# Patient Record
Sex: Male | Born: 1935 | Race: White | Hispanic: No | Marital: Married | Smoking: Never smoker
Health system: Southern US, Community
[De-identification: ages and names within clinical notes are randomized; demographics above are authoritative.]

## PROBLEM LIST (undated history)

## (undated) DIAGNOSIS — I509 Heart failure, unspecified: Secondary | ICD-10-CM

## (undated) DIAGNOSIS — N183 Chronic kidney disease, stage 3 unspecified: Secondary | ICD-10-CM

## (undated) DIAGNOSIS — Z950 Presence of cardiac pacemaker: Secondary | ICD-10-CM

## (undated) DIAGNOSIS — H353230 Exudative age-related macular degeneration, bilateral, stage unspecified: Secondary | ICD-10-CM

## (undated) DIAGNOSIS — I10 Essential (primary) hypertension: Secondary | ICD-10-CM

## (undated) DIAGNOSIS — I5031 Acute diastolic (congestive) heart failure: Secondary | ICD-10-CM

## (undated) DIAGNOSIS — K219 Gastro-esophageal reflux disease without esophagitis: Secondary | ICD-10-CM

## (undated) DIAGNOSIS — E1140 Type 2 diabetes mellitus with diabetic neuropathy, unspecified: Secondary | ICD-10-CM

## (undated) DIAGNOSIS — L03115 Cellulitis of right lower limb: Secondary | ICD-10-CM

## (undated) DIAGNOSIS — E119 Type 2 diabetes mellitus without complications: Secondary | ICD-10-CM

## (undated) DIAGNOSIS — M1990 Unspecified osteoarthritis, unspecified site: Secondary | ICD-10-CM

## (undated) DIAGNOSIS — K529 Noninfective gastroenteritis and colitis, unspecified: Secondary | ICD-10-CM

## (undated) DIAGNOSIS — E11319 Type 2 diabetes mellitus with unspecified diabetic retinopathy without macular edema: Secondary | ICD-10-CM

## (undated) DIAGNOSIS — J90 Pleural effusion, not elsewhere classified: Secondary | ICD-10-CM

## (undated) DIAGNOSIS — I441 Atrioventricular block, second degree: Secondary | ICD-10-CM

## (undated) HISTORY — DX: Noninfective gastroenteritis and colitis, unspecified: K52.9

## (undated) HISTORY — PX: BACK SURGERY: SHX140

## (undated) HISTORY — PX: APPENDECTOMY: SHX54

## (undated) HISTORY — PX: CATARACT EXTRACTION W/ INTRAOCULAR LENS  IMPLANT, BILATERAL: SHX1307

## (undated) HISTORY — DX: Heart failure, unspecified: I50.9

## (undated) HISTORY — DX: Pleural effusion, not elsewhere classified: J90

## (undated) HISTORY — PX: LUMBAR DISC SURGERY: SHX700

## (undated) HISTORY — DX: Cellulitis of right lower limb: L03.115

## (undated) HISTORY — PX: BRAIN SURGERY: SHX531

---

## 2001-02-16 ENCOUNTER — Encounter: Payer: Self-pay | Admitting: Ophthalmology

## 2001-02-17 ENCOUNTER — Ambulatory Visit (HOSPITAL_COMMUNITY): Admission: RE | Admit: 2001-02-17 | Discharge: 2001-02-18 | Payer: Self-pay | Admitting: Ophthalmology

## 2001-12-14 ENCOUNTER — Encounter: Payer: Self-pay | Admitting: Internal Medicine

## 2001-12-14 ENCOUNTER — Encounter: Admission: RE | Admit: 2001-12-14 | Discharge: 2001-12-14 | Payer: Self-pay | Admitting: Internal Medicine

## 2004-04-05 ENCOUNTER — Encounter: Admission: RE | Admit: 2004-04-05 | Discharge: 2004-04-05 | Payer: Self-pay | Admitting: Internal Medicine

## 2005-10-18 ENCOUNTER — Emergency Department (HOSPITAL_COMMUNITY): Admission: EM | Admit: 2005-10-18 | Discharge: 2005-10-18 | Payer: Self-pay | Admitting: Emergency Medicine

## 2006-08-14 ENCOUNTER — Encounter: Admission: RE | Admit: 2006-08-14 | Discharge: 2006-08-14 | Payer: Self-pay | Admitting: Family Medicine

## 2008-09-01 ENCOUNTER — Encounter: Admission: RE | Admit: 2008-09-01 | Discharge: 2008-09-01 | Payer: Self-pay | Admitting: Internal Medicine

## 2009-07-08 ENCOUNTER — Emergency Department (HOSPITAL_COMMUNITY): Admission: EM | Admit: 2009-07-08 | Discharge: 2009-07-08 | Payer: Self-pay | Admitting: Emergency Medicine

## 2010-12-14 NOTE — H&P (Signed)
Ronald Lindsey. [facility]  Patient:    Ronald Lindsey, Ronald Lindsey                       MRN: [id number] Adm. Date:  02/17/01 Attending:  Guadelupe Sabin, M.D.                         History and Physical  REASON FOR ADMISSION:  This was a planned outpatient readmission of this 77 year old white male admitted for cataract implant surgery of the left eye.  PRESENT ILLNESS:  This patient has a long history of insulin-dependent diabetes mellitus and secondary complications consisting of diabetic retinopathy.  Patient was referred to my office in January 1998 by Dr. Caleen Jobs for evaluation of diabetic retinopathy with macular edema.  Initially examination revealed a visual acuity of 20/100 right eye, 20/50 left eye without correction, and 20/40- right eye, 20/30- left eye with correction. Detailed fundus examination revealed background diabetic retinopathy with paramacular edema.  Subsequent laser photocoagulation was performed.  Later, cataract formation began to develop later in December 1998 and the patient was previously admitted to the hospital on August 02, 1997 for cataract implant surgery of the right eye.  Patient tolerated the procedure well and vision gradually improved to 20/40 in the operated eye.  Meanwhile, the unoperated left eye has deteriorated despite additional laser photocoagulation for macular edema.  It was felt that the patient warranted similar cataract implant surgery of the left eye at this time.  Patient was given oral discussion and printed information concerning the procedure and its complications.  He signed an informed consent and arrangements were made for his outpatient admission at this time.  PAST MEDICAL HISTORY:  Patient checks his blood sugar erratically and continuous to take Ocuvite multivitamin tablets for his macular edema. Patient is currently under the care of Dr. Merril Abbe, taking several medications under his direction.   These include Glucotrol, Glucophage, and Avandia for his blood sugar control.  REVIEW OF SYSTEMS:  No cardiorespiratory complaints.  PHYSICAL EXAMINATION:  VITAL SIGNS:  As recorded on admission, blood pressure 132/63, respirations 18, heart rate 67, temperature 96.9.  GENERAL APPEARANCE:  Patient is a pleasant, well-nourished, well-developed white male in no acute ocular distress.  HEENT:  Visual acuity last recorded at 20/40+2 right eye, 20/70 left eye. Applanation tonometry:  18 mm right eye, 17 mm left eye.  External ocular and slit lamp examination:  The eyes are white and clear.  The right eye reveals a posterior chamber intraocular lens implant with a clear posterior capsule. Left eye reveals nuclear cataract formation.  Detailed fundus examination shows at this time a clear vitreous, attached retina.  The optic nerve is sharply outlined, of good color, disk/cup ratio 0.4.  Old background diabetic retinopathy with laser photocoagulation scars is present in both eyes.  There does not appear to be any clinically significant macular edema at the present time.  CHEST:  Lungs clear to percussion and auscultation.  HEART:  Normal sinus rhythm, no cardiomegaly, no murmurs.  ABDOMEN:  Negative.  EXTREMITIES:  Negative.  ADMISSION DIAGNOSES: 1. Senile cataract left eye, pseudophakia right eye. 2. Non-insulin-dependent diabetes. 3. Diabetic background retinopathy both eyes, status post laser    photocoagulation both eyes.  SURGICAL PLAN:  Cataract implant surgery - left eye. DD:  02/17/01 TD:  02/17/01 Job: [id number]

## 2010-12-14 NOTE — Op Note (Signed)
[place]. [facility]  Patient:    Ronald Lindsey, Ronald Lindsey                       MRN: [id number] Proc. Date: 02/17/01 Attending:  Guadelupe Sabin, M.D.                           Operative Report  PREOPERATIVE DIAGNOSES: 1. Senile cataract, left eye. 2. Background diabetic retinopathy, left eye. 3. Status post focal laser photocoagulation, left eye.  SURGEON:  Guadelupe Sabin, M.D.  ASSISTANT:  Nurse.  ANESTHESIA:  Local - 4% Xylocaine, 0.75% Marcaine.  Anesthesia standby required.  Patient given sodium pentothal intravenously during the period of retrobulbar injection.  OPERATIVE PROCEDURE:  After the patient was prepped and draped a lid speculum was inserted in the left eye.  Schiotz tonometry was recorded at 6-7 scale units with a 5.5 g weight.  A peritomy was performed adjacent to the limbus from the 11 to 1 oclock position.  The corneoscleral junction was cleaned and a corneoscleral groove made with a 45 degree Superblade.  The anterior chamber was then entered with the Diamond 2.5 mm keratome at the 12 oclock position and the 15 degree blade at the 2:30 position.  Using a bent 26 gauge needle on a Healon syringe, a circular capsulorrhexis was begun and then completed with the Grabow forceps.  Hydrodissection and hydrodelineation were performed using 1% Xylocaine.  The 30 degree phacoemulsification tip was then inserted with slow, controlled emulsification of the lens nucleus.  Total ultrasonic time - one minute six seconds; average power level - 17%; total amount of fluid used - 75 cc.  Following removal of the nucleus the cortex was aspirated with the irrigation aspiration tip.  The posterior capsule appeared intact with brilliant red fundus reflex.  It was therefore elected to insert an [employer] SI40NB silicon posterior chamber intraocular lens with UV absorber, diopter strength +21.50.  This was inserted with the McDonald forceps into  the anterior chamber and then centered into the capsular bag using the [facility] lens rotator.  The lens appeared to be well centered.  The Healon which had been used during the procedure was aspirated and replaced with balanced salt solution and Miochol ophthalmic solution.  The operative incisions appeared to be self-sealing and no sutures were required.  Maxitrol ointment was instilled in the conjunctival cul-de-sac.  A light patch and protector shield were applied.  Duration of procedure and anesthesia administration - 45 minutes.  Patient tolerated the procedure well in general, left the operating room for the recovery room in good condition. DD:  02/17/01 TD:  02/17/01 Job: [id number]

## 2011-11-06 ENCOUNTER — Inpatient Hospital Stay (HOSPITAL_COMMUNITY)
Admission: EM | Admit: 2011-11-06 | Discharge: 2011-11-13 | DRG: 392 | Disposition: A | Discharge status: Home Health | Payer: Medicare Other | Attending: Internal Medicine | Admitting: Internal Medicine

## 2011-11-06 ENCOUNTER — Encounter (HOSPITAL_COMMUNITY): Payer: Self-pay | Admitting: Emergency Medicine

## 2011-11-06 DIAGNOSIS — I10 Essential (primary) hypertension: Secondary | ICD-10-CM | POA: Insufficient documentation

## 2011-11-06 DIAGNOSIS — Z9049 Acquired absence of other specified parts of digestive tract: Secondary | ICD-10-CM

## 2011-11-06 DIAGNOSIS — E1149 Type 2 diabetes mellitus with other diabetic neurological complication: Secondary | ICD-10-CM | POA: Diagnosis present

## 2011-11-06 DIAGNOSIS — E1142 Type 2 diabetes mellitus with diabetic polyneuropathy: Secondary | ICD-10-CM | POA: Diagnosis present

## 2011-11-06 DIAGNOSIS — K529 Noninfective gastroenteritis and colitis, unspecified: Secondary | ICD-10-CM

## 2011-11-06 DIAGNOSIS — K5289 Other specified noninfective gastroenteritis and colitis: Principal | ICD-10-CM | POA: Diagnosis present

## 2011-11-06 DIAGNOSIS — E860 Dehydration: Secondary | ICD-10-CM

## 2011-11-06 DIAGNOSIS — E119 Type 2 diabetes mellitus without complications: Secondary | ICD-10-CM | POA: Diagnosis present

## 2011-11-06 DIAGNOSIS — R142 Eructation: Secondary | ICD-10-CM | POA: Diagnosis not present

## 2011-11-06 DIAGNOSIS — L02419 Cutaneous abscess of limb, unspecified: Secondary | ICD-10-CM | POA: Diagnosis present

## 2011-11-06 DIAGNOSIS — M549 Dorsalgia, unspecified: Secondary | ICD-10-CM | POA: Insufficient documentation

## 2011-11-06 DIAGNOSIS — D72829 Elevated white blood cell count, unspecified: Secondary | ICD-10-CM | POA: Diagnosis present

## 2011-11-06 DIAGNOSIS — K560 Paralytic ileus: Secondary | ICD-10-CM | POA: Diagnosis not present

## 2011-11-06 DIAGNOSIS — N179 Acute kidney failure, unspecified: Secondary | ICD-10-CM | POA: Diagnosis present

## 2011-11-06 DIAGNOSIS — H3530 Unspecified macular degeneration: Secondary | ICD-10-CM | POA: Insufficient documentation

## 2011-11-06 DIAGNOSIS — Z9889 Other specified postprocedural states: Secondary | ICD-10-CM

## 2011-11-06 DIAGNOSIS — M7989 Other specified soft tissue disorders: Secondary | ICD-10-CM

## 2011-11-06 DIAGNOSIS — M545 Low back pain, unspecified: Secondary | ICD-10-CM | POA: Diagnosis present

## 2011-11-06 DIAGNOSIS — R141 Gas pain: Secondary | ICD-10-CM | POA: Diagnosis not present

## 2011-11-06 DIAGNOSIS — N19 Unspecified kidney failure: Secondary | ICD-10-CM | POA: Diagnosis present

## 2011-11-06 DIAGNOSIS — G8929 Other chronic pain: Secondary | ICD-10-CM | POA: Diagnosis present

## 2011-11-06 DIAGNOSIS — Z794 Long term (current) use of insulin: Secondary | ICD-10-CM

## 2011-11-06 DIAGNOSIS — E11319 Type 2 diabetes mellitus with unspecified diabetic retinopathy without macular edema: Secondary | ICD-10-CM | POA: Insufficient documentation

## 2011-11-06 DIAGNOSIS — E1139 Type 2 diabetes mellitus with other diabetic ophthalmic complication: Secondary | ICD-10-CM | POA: Diagnosis present

## 2011-11-06 DIAGNOSIS — L03115 Cellulitis of right lower limb: Secondary | ICD-10-CM | POA: Diagnosis present

## 2011-11-06 DIAGNOSIS — K6389 Other specified diseases of intestine: Secondary | ICD-10-CM | POA: Diagnosis present

## 2011-11-06 DIAGNOSIS — L03119 Cellulitis of unspecified part of limb: Secondary | ICD-10-CM | POA: Diagnosis present

## 2011-11-06 DIAGNOSIS — E1140 Type 2 diabetes mellitus with diabetic neuropathy, unspecified: Secondary | ICD-10-CM | POA: Insufficient documentation

## 2011-11-06 DIAGNOSIS — R5381 Other malaise: Secondary | ICD-10-CM | POA: Diagnosis not present

## 2011-11-06 DIAGNOSIS — K219 Gastro-esophageal reflux disease without esophagitis: Secondary | ICD-10-CM | POA: Insufficient documentation

## 2011-11-06 DIAGNOSIS — N183 Chronic kidney disease, stage 3 unspecified: Secondary | ICD-10-CM | POA: Diagnosis present

## 2011-11-06 DIAGNOSIS — I129 Hypertensive chronic kidney disease with stage 1 through stage 4 chronic kidney disease, or unspecified chronic kidney disease: Secondary | ICD-10-CM | POA: Diagnosis present

## 2011-11-06 HISTORY — DX: Gastro-esophageal reflux disease without esophagitis: K21.9

## 2011-11-06 HISTORY — DX: Cellulitis of right lower limb: L03.115

## 2011-11-06 HISTORY — DX: Type 2 diabetes mellitus with diabetic neuropathy, unspecified: E11.40

## 2011-11-06 HISTORY — DX: Essential (primary) hypertension: I10

## 2011-11-06 HISTORY — DX: Noninfective gastroenteritis and colitis, unspecified: K52.9

## 2011-11-06 LAB — COMPREHENSIVE METABOLIC PANEL
ALT: 61 U/L — ABNORMAL HIGH (ref 0–53)
AST: 130 U/L — ABNORMAL HIGH (ref 0–37)
CO2: 18 mEq/L — ABNORMAL LOW (ref 19–32)
Calcium: 8.3 mg/dL — ABNORMAL LOW (ref 8.4–10.5)
GFR calc non Af Amer: 21 mL/min — ABNORMAL LOW (ref >90)
Sodium: 136 mEq/L (ref 135–145)

## 2011-11-06 LAB — CBC
MCV: 97.2 fL (ref 78.0–100.0)
Platelets: 159 10*3/uL (ref 150–400)
RDW: 14.3 % (ref 11.5–15.5)
WBC: 22.6 10*3/uL — ABNORMAL HIGH (ref 4.0–10.5)

## 2011-11-06 LAB — DIFFERENTIAL
Basophils Absolute: 0 10*3/uL (ref 0.0–0.1)
Eosinophils Relative: 0 % (ref 0–5)
Lymphocytes Relative: 4 % — ABNORMAL LOW (ref 12–46)
Neutro Abs: 20.7 10*3/uL — ABNORMAL HIGH (ref 1.7–7.7)

## 2011-11-06 LAB — GLUCOSE, CAPILLARY
Glucose-Capillary: 63 mg/dL — ABNORMAL LOW (ref 70–99)
Glucose-Capillary: 90 mg/dL (ref 70–99)
Glucose-Capillary: 85 mg/dL (ref 70–99)

## 2011-11-06 MED ORDER — SIMETHICONE 80 MG PO CHEW
80.0000 mg | CHEWABLE_TABLET | Freq: Four times a day (QID) | ORAL | Status: DC
  Administered 2011-11-06 (×2): 80 mg via ORAL
  Filled 2011-11-06 (×6): qty 1

## 2011-11-06 MED ORDER — FAMOTIDINE IN NACL 20-0.9 MG/50ML-% IV SOLN
20.0000 mg | Freq: Two times a day (BID) | INTRAVENOUS | Status: DC
  Administered 2011-11-06 – 2011-11-07 (×3): 20 mg via INTRAVENOUS
  Filled 2011-11-06 (×4): qty 50

## 2011-11-06 MED ORDER — MORPHINE SULFATE 2 MG/ML IJ SOLN
2.0000 mg | Freq: Once | INTRAMUSCULAR | Status: AC
  Administered 2011-11-06: 2 mg via INTRAVENOUS
  Filled 2011-11-06: qty 1

## 2011-11-06 MED ORDER — PANTOPRAZOLE SODIUM 40 MG IV SOLR
40.0000 mg | INTRAVENOUS | Status: DC
  Administered 2011-11-07: 40 mg via INTRAVENOUS
  Filled 2011-11-06 (×2): qty 40

## 2011-11-06 MED ORDER — SODIUM CHLORIDE 0.9 % IV BOLUS (SEPSIS)
500.0000 mL | Freq: Once | INTRAVENOUS | Status: AC
  Administered 2011-11-06: 500 mL via INTRAVENOUS

## 2011-11-06 MED ORDER — HEPARIN SODIUM (PORCINE) 5000 UNIT/ML IJ SOLN
5000.0000 [IU] | Freq: Three times a day (TID) | INTRAMUSCULAR | Status: DC
  Administered 2011-11-06 – 2011-11-13 (×20): 5000 [IU] via SUBCUTANEOUS
  Filled 2011-11-06 (×27): qty 1

## 2011-11-06 MED ORDER — HYDROMORPHONE HCL PF 1 MG/ML IJ SOLN
1.0000 mg | INTRAMUSCULAR | Status: DC | PRN
  Administered 2011-11-06 – 2011-11-08 (×3): 1 mg via INTRAVENOUS
  Filled 2011-11-06 (×3): qty 1

## 2011-11-06 MED ORDER — SIMETHICONE 40 MG/0.6ML PO SUSP (UNIT DOSE)
40.0000 mg | Freq: Once | ORAL | Status: AC
  Administered 2011-11-06: 40 mg via ORAL
  Filled 2011-11-06 (×2): qty 0.6

## 2011-11-06 MED ORDER — DEXTROSE-NACL 5-0.9 % IV SOLN
INTRAVENOUS | Status: DC
  Administered 2011-11-06: 125 mL/h via INTRAVENOUS
  Administered 2011-11-06: 15:00:00 via INTRAVENOUS

## 2011-11-06 MED ORDER — CEFAZOLIN SODIUM 1-5 GM-% IV SOLN
1.0000 g | Freq: Three times a day (TID) | INTRAVENOUS | Status: DC
  Administered 2011-11-06 – 2011-11-13 (×21): 1 g via INTRAVENOUS
  Filled 2011-11-06 (×24): qty 50

## 2011-11-06 MED ORDER — ACETAMINOPHEN 650 MG RE SUPP: 650.0000 mg | Freq: Four times a day (QID) | RECTAL | Status: DC | PRN

## 2011-11-06 MED ORDER — SODIUM CHLORIDE 0.9 % IV BOLUS (SEPSIS): 500.0000 mL | Freq: Once | INTRAVENOUS | Status: DC

## 2011-11-06 MED ORDER — ONDANSETRON HCL 4 MG/2ML IJ SOLN
INTRAMUSCULAR | Status: AC
  Administered 2011-11-06: 4 mg via INTRAVENOUS
  Filled 2011-11-06: qty 2

## 2011-11-06 MED ORDER — SODIUM CHLORIDE 0.9 % IV SOLN: Freq: Once | INTRAVENOUS | Status: DC

## 2011-11-06 MED ORDER — ACETAMINOPHEN 325 MG PO TABS
650.0000 mg | ORAL_TABLET | Freq: Four times a day (QID) | ORAL | Status: DC | PRN
  Administered 2011-11-12 (×2): 650 mg via ORAL
  Filled 2011-11-06 (×2): qty 2

## 2011-11-06 MED ORDER — ONDANSETRON HCL 4 MG/2ML IJ SOLN
4.0000 mg | Freq: Four times a day (QID) | INTRAMUSCULAR | Status: DC | PRN
  Administered 2011-11-06 (×2): 4 mg via INTRAVENOUS
  Filled 2011-11-06 (×2): qty 2

## 2011-11-06 MED ORDER — ONDANSETRON HCL 4 MG PO TABS: 4.0000 mg | ORAL_TABLET | Freq: Four times a day (QID) | ORAL | Status: DC | PRN

## 2011-11-06 MED ORDER — DEXTROSE 50 % IV SOLN
25.0000 mL | Freq: Once | INTRAVENOUS | Status: AC
  Administered 2011-11-06: 25 mL via INTRAVENOUS
  Filled 2011-11-06: qty 50

## 2011-11-06 NOTE — Progress Notes (Signed)
Pt. Arrived on the floor 1630, states feeling very nauseous. No emesis or diarrhea noted. He remains NPO. Sleeping at present.

## 2011-11-06 NOTE — ED Notes (Signed)
Bed:WA23<BR> Expected date:<BR> Expected time:<BR> Means of arrival:<BR> Comments:<BR> Ems/ n/v

## 2011-11-06 NOTE — ED Notes (Signed)
CBG registered 90 on ED Glucometer 

## 2011-11-06 NOTE — ED Provider Notes (Signed)
History     CSN: [id number]  Arrival date & time 11/06/11  4696   First MD Initiated Contact with Patient 11/06/11 0840      Chief Complaint  Patient presents with  . Nausea  . Emesis  . Diarrhea    (Consider location/radiation/quality/duration/timing/severity/associated sxs/prior treatment) HPI Pt p/w N/V/D starting late on Monday. Pt states wife had similar symptoms earlier. Has had multiple episodes of diarrhea > 20 and continues to vomit occasionally. Fever at home per wife. No blood in stool or vomitus. No abdominal pain currently. Tolerating fluids.  Past Medical History  Diagnosis Date  . Diabetes mellitus     Past Surgical History  Procedure Date  . Brain surgery     History reviewed. No pertinent family history.  History  Substance Use Topics  . Smoking status: Never Smoker   . Smokeless tobacco: Never Used  . Alcohol Use: No      Review of Systems  Constitutional: Positive for fever and fatigue. Negative for chills.  HENT: Negative for neck pain.   Respiratory: Negative for shortness of breath.   Cardiovascular: Negative for chest pain.  Gastrointestinal: Positive for nausea, vomiting and diarrhea. Negative for abdominal pain, blood in stool and abdominal distention.  Genitourinary: Negative for dysuria and flank pain.  Skin: Negative for rash and wound.  Neurological: Negative for dizziness, weakness and numbness.    Allergies  Review of patient's allergies indicates no known allergies.  Home Medications   Current Outpatient Rx  Name Route Sig Dispense Refill  . ASPIRIN 81 MG PO CHEW Oral Chew 81 mg by mouth daily.    . FUROSEMIDE 20 MG PO TABS Oral Take 20 mg by mouth 2 (two) times daily.    . INSULIN GLARGINE 100 UNIT/ML [place] SOLN Subcutaneous Inject 65 Units into the skin daily.    Marland Kitchen LISINOPRIL-HYDROCHLOROTHIAZIDE 10-12.5 MG PO TABS Oral Take 1 tablet by mouth daily.    Marland Kitchen METHOCARBAMOL 750 MG PO TABS Oral Take 750 mg by mouth every 8 (eight)  hours as needed. For pain    . OMEPRAZOLE 20 MG PO CPDR Oral Take 20 mg by mouth daily.    Marland Kitchen PREDNISONE 10 MG PO TABS Oral Take 10 mg by mouth daily. Patient takes 6,5,4,3,2,1      BP 106/64  Pulse 72  Temp(Src) 98.9 F (37.2 C) (Oral)  Resp 18  Ht 5\' 9"  (1.753 m)  Wt 256 lb (116.121 kg)  BMI 37.80 kg/m2  SpO2 99%  Physical Exam  Nursing note and vitals reviewed. Constitutional: He is oriented to person, place, and time. He appears well-developed and well-nourished. No distress.  HENT:  Head: Normocephalic and atraumatic.  Mouth/Throat: Oropharynx is clear and moist.  Eyes: EOM are normal. Pupils are equal, round, and reactive to light.  Neck: Normal range of motion. Neck supple.  Cardiovascular: Normal rate and regular rhythm.   Pulmonary/Chest: Effort normal and breath sounds normal. No respiratory distress. He has no wheezes. He has no rales.  Abdominal: Soft. Bowel sounds are normal. There is no tenderness. There is no rebound and no guarding.  Musculoskeletal: Normal range of motion. He exhibits no edema and no tenderness.       RLE, mildly erythematous and swollen. No calf tenderness or definite cellulitis  Neurological: He is alert and oriented to person, place, and time.  Skin: Skin is warm and dry. No rash noted. No erythema.  Psychiatric: He has a normal mood and affect. His behavior is normal.  ED Course  Procedures (including critical care time)  Labs Reviewed  GLUCOSE, CAPILLARY - Abnormal; Notable for the following:    Glucose-Capillary 63 (*)    All other components within normal limits  CBC - Abnormal; Notable for the following:    WBC 22.6 (*)    All other components within normal limits  DIFFERENTIAL - Abnormal; Notable for the following:    Neutrophils Relative 92 (*)    Neutro Abs 20.7 (*)    Lymphocytes Relative 4 (*)    All other components within normal limits  COMPREHENSIVE METABOLIC PANEL - Abnormal; Notable for the following:    CO2 18 (*)     Glucose, Bld 63 (*)    BUN 57 (*)    Creatinine, Ser 2.64 (*)    Calcium 8.3 (*)    Albumin 2.9 (*)    AST 130 (*)    ALT 61 (*)    GFR calc non Af Amer 21 (*)    GFR calc Af Amer 24 (*)    All other components within normal limits  LIPASE, BLOOD - Abnormal; Notable for the following:    Lipase 9 (*)    All other components within normal limits  GLUCOSE, CAPILLARY  URINALYSIS, ROUTINE W REFLEX MICROSCOPIC   No results found.   1. Gastroenteritis   2. Dehydration   3. Renal failure       MDM  Discussed with [facility]. Will admit.         Loren Racer, MD 11/06/11 1146

## 2011-11-06 NOTE — H&P (Signed)
PCP:   Katy Apo, MD, MD   Chief Complaint:  Nausea/ vomiting/ diarrhea  HPI: This is an 78 y/o male with DM, HTN, recent back pain treated w/ steroids who comes in with 2 days of vomiting and diarrhea. He has had numerous episodes. No blood noted in vomitus or stool.His stomach is bloated but he can burp. Abdominal pain is diffuse. Per his wife he had a fever of 102 last night. She was sick with the same symptoms but has recovered quickly.  He was hypoglycemic last night and in the ER today. He took 40 U of his Lantus (usual dose) last night.  Wife also noted his right leg is very red today. He is not having any pain in it.    Review of Systems:  Positive for  - back pain not improved w/ course of Prednisone ordered by Dr polite -retinopathy and macular degeneration - numbness sin feet and hands -pedal edema being treated w/ lasix  Negative for - migranes - CVA/ TIA - depression/ anxiety - chest pain/ palpitations - dyspnea/ cough/ sinus trouble/ flu like symptoms  Past Medical History: Past Medical History  Diagnosis Date  . Diabetes mellitus   . Diabetic retinopathy   . Macular degeneration   . Diabetic neuropathy   . HTN (hypertension)   . GERD (gastroesophageal reflux disease)   . Back pain   . Previous back surgery 11/06/2011  . S/P appendectomy 11/06/2011   Past Surgical History  Procedure Date  . Brain surgery     Medications: Prior to Admission medications   Medication Sig Start Date End Date Taking? Authorizing Provider  aspirin 81 MG chewable tablet Chew 81 mg by mouth daily.   Yes Historical Provider, MD  furosemide (LASIX) 20 MG tablet Take 20 mg by mouth 2 (two) times daily.   Yes Historical Provider, MD  insulin glargine (LANTUS) 100 UNIT/ML injection 40 U BID   Yes Historical Provider, MD  lisinopril-hydrochlorothiazide (PRINZIDE,ZESTORETIC) 10-12.5 MG per tablet Take 1 tablet by mouth daily.   Yes Historical Provider, MD  methocarbamol (ROBAXIN)  750 MG tablet Take 750 mg by mouth every 8 (eight) hours as needed. For pain   Yes Historical Provider, MD  omeprazole (PRILOSEC) 20 MG capsule Take 20 mg by mouth daily.   Yes Historical Provider, MD  predniSONE (DELTASONE) 10 MG tablet Finished a taper 10/30/11  Yes Historical Provider, MD    Allergies:  No Known Allergies  Social History:  reports that he has never smoked. He has never used smokeless tobacco. He reports that he does not drink alcohol or use illicit drugs.  Family History: Mother had cancer (type unknown to patient) and angina Brother has CAD w/ stents  Physical Exam: Filed Vitals:   11/06/11 0832 11/06/11 0839  BP:  106/64  Pulse:  72  Temp:  98.9 F (37.2 C)  TempSrc:  Oral  Resp:  18  Height: 5\' 9"  (1.753 m)   Weight: 116.121 kg (256 lb)   SpO2:  99%   General appearance: alert, cooperative and morbidly obese Throat: dry mouth Resp: clear to auscultation bilaterally Cardio: regular rate and rhythm, S1, S2 normal, no murmur, click, rub or gallop GI: distended - tight , tympanic, BS positive, nontender.  Extremities: no c/c/e Skin:  right leg with erythema from ankle to just below the knee, peticheal hemorrhages, mildly warm, nontender, no edema   Labs on Admission:   [facility] 11/06/11 0915  NA 136  K 3.6  CL  104  CO2 18*  GLUCOSE 63*  BUN 57*  CREATININE 2.64*  CALCIUM 8.3*  MG --  PHOS --    Basename 11/06/11 0915  AST 130*  ALT 61*  ALKPHOS 71  BILITOT 0.6  PROT 6.2  ALBUMIN 2.9*    Basename 11/06/11 0915  LIPASE 9*  AMYLASE --    Basename 11/06/11 0915  WBC 22.6*  NEUTROABS 20.7*  HGB 14.7  HCT 44.7  MCV 97.2  PLT 159   No results found for this basename: CKTOTAL:3,CKMB:3,CKMBINDEX:3,TROPONINI:3 in the last 72 hours No results found for this basename: TSH,T4TOTAL,FREET3,T3FREE,THYROIDAB in the last 72 hours No results found for this basename: VITAMINB12:2,FOLATE:2,FERRITIN:2,TIBC:2,IRON:2,RETICCTPCT:2 in the last 72  hours  Radiological Exams on Admission: No results found.  EKG: Orders placed during the hospital encounter of 11/06/11  . EKG 12-LEAD  . EKG 12-LEAD    Assessment/Plan Principal Problem:  *Gastroenteritis Suspect that this is viral and he caught it from his wife- may be norovirus Will treat symptomatically w/ IVF, NPO, Zofran, Pepcid, Simethicone  Active Problems:  Renal failure No baseline to compare with Hydrate Hold lasix and ACE/HCTZ REnal dose meds   Cellulitis of right leg Ancef for now Ultrasound of leg done in ER- was negative for DVT   Back pain Dilaudid PRN   DM (diabetes mellitus)- with hypoglycemia from lantus D5 in fluids, accuchecks Q4   HTN (hypertension) Currently hypotense- hold meds   GERD (gastroesophageal reflux disease) Protonix and pepcid IV   Diabetic retinopathy  Macular degeneration  Diabetic neuropathy   Kellye Mizner 11/06/2011, 12:55 PM

## 2011-11-06 NOTE — Progress Notes (Signed)
VASCULAR LAB PRELIMINARY  PRELIMINARY  PRELIMINARY  PRELIMINARY  Right lower extremity venous duplex completed.    Preliminary report:  Right:  No evidence of DVT, superficial thrombosis, or Baker's cyst.  Terance Hart, RVT 11/06/2011, 9:38 AM

## 2011-11-06 NOTE — ED Notes (Signed)
Pt aware of the need for a urine sample however, is unable to void at this time. Urinal at bedside. 

## 2011-11-06 NOTE — ED Notes (Signed)
CBG registered 63 on ED Glucometer

## 2011-11-06 NOTE — ED Notes (Signed)
CBG registered 85 on ED Glucometer. 

## 2011-11-07 ENCOUNTER — Inpatient Hospital Stay (HOSPITAL_COMMUNITY): Payer: Medicare Other

## 2011-11-07 DIAGNOSIS — R112 Nausea with vomiting, unspecified: Secondary | ICD-10-CM

## 2011-11-07 DIAGNOSIS — K560 Paralytic ileus: Secondary | ICD-10-CM

## 2011-11-07 LAB — GLUCOSE, CAPILLARY
Glucose-Capillary: 257 mg/dL — ABNORMAL HIGH (ref 70–99)
Glucose-Capillary: 238 mg/dL — ABNORMAL HIGH (ref 70–99)
Glucose-Capillary: 125 mg/dL — ABNORMAL HIGH (ref 70–99)

## 2011-11-07 LAB — CBC
HCT: 41.6 % (ref 39.0–52.0)
Hemoglobin: 13.8 g/dL (ref 13.0–17.0)
MCV: 95.9 fL (ref 78.0–100.0)
RBC: 4.34 MIL/uL (ref 4.22–5.81)
WBC: 11.1 10*3/uL — ABNORMAL HIGH (ref 4.0–10.5)

## 2011-11-07 LAB — BASIC METABOLIC PANEL
CO2: 18 mEq/L — ABNORMAL LOW (ref 19–32)
Chloride: 107 mEq/L (ref 96–112)
Sodium: 134 mEq/L — ABNORMAL LOW (ref 135–145)

## 2011-11-07 LAB — URINALYSIS, ROUTINE W REFLEX MICROSCOPIC
Glucose, UA: 250 mg/dL — AB
Protein, ur: 30 mg/dL — AB
Specific Gravity, Urine: 1.022 (ref 1.005–1.030)
pH: 6 (ref 5.0–8.0)

## 2011-11-07 MED ORDER — CHLORHEXIDINE GLUCONATE 0.12 % MT SOLN
15.0000 mL | Freq: Two times a day (BID) | OROMUCOSAL | Status: DC
  Administered 2011-11-08 – 2011-11-12 (×8): 15 mL via OROMUCOSAL
  Filled 2011-11-07 (×15): qty 15

## 2011-11-07 MED ORDER — SODIUM CHLORIDE 0.9 % IV SOLN
INTRAVENOUS | Status: AC
  Administered 2011-11-07 (×3): via INTRAVENOUS
  Administered 2011-11-08: 1000 mL via INTRAVENOUS
  Administered 2011-11-08 (×2): via INTRAVENOUS
  Administered 2011-11-09: 1000 mL via INTRAVENOUS

## 2011-11-07 MED ORDER — BIOTENE DRY MOUTH MT LIQD
15.0000 mL | Freq: Two times a day (BID) | OROMUCOSAL | Status: DC
  Administered 2011-11-07 – 2011-11-13 (×11): 15 mL via OROMUCOSAL

## 2011-11-07 NOTE — Consult Note (Signed)
Reason for Consult: Possible SBO vs ileus Referring Physician: Talib Headley is an 78 y.o. male.  HPI: Patient's 78 year old gentleman whose wife developed an upper GI virus over the weekend with nausea vomiting and diarrhea. He developed symptoms on Monday and became progressively worse. His sugar dropped down into the 40s and the EMS was called Tuesday evening 2 days ago. He refused transport to the hospital at that time. Were able to improve glucose with by mouth intake, but had recurrent nausea vomiting diarrhea after that. He showed no improvement and ultimately came to the emergency room and was admitted on 11/06/2011 by Dr. Nehemiah Settle. It was his opinion patient had GI flu and acute renal failure. Since his admission and further distention of his abdomen. The nausea vomiting and diarrhea have improved. Flat plate shows amount of stool in the cecum and distention of the small bowel and colon up to 13.6 cm. He's been seen by Dr. Evette Cristal. NG tube was been placed. It was his opinion he did not require colonoscopy at this time. We'll see in consultation and follow with you.  Past Medical History  Diagnosis Date  . Diabetes mellitus   . Diabetic retinopathy   . Macular degeneration   . Diabetic neuropathy   . HTN (hypertension)   . GERD (gastroesophageal reflux disease)   . Back pain   . Previous back surgery 11/06/2011  . S/P appendectomy 11/06/2011    Past Surgical History  Procedure Date  . Brain surgery     History reviewed. No pertinent family history.  Social History:  reports that he has never smoked. He has never used smokeless tobacco. He reports that he does not drink alcohol or use illicit drugs.  Allergies: No Known Allergies  Medications:  Prior to Admission:  Prescriptions prior to admission  Medication Sig Dispense Refill  . aspirin 81 MG chewable tablet Chew 81 mg by mouth daily.      . furosemide (LASIX) 20 MG tablet Take 20 mg by mouth 2 (two) times daily.        . insulin glargine (LANTUS) 100 UNIT/ML injection Inject 65 Units into the skin daily.      Marland Kitchen lisinopril-hydrochlorothiazide (PRINZIDE,ZESTORETIC) 10-12.5 MG per tablet Take 1 tablet by mouth daily.      . methocarbamol (ROBAXIN) 750 MG tablet Take 750 mg by mouth every 8 (eight) hours as needed. For pain      . omeprazole (PRILOSEC) 20 MG capsule Take 20 mg by mouth daily.      . predniSONE (DELTASONE) 10 MG tablet Take 10 mg by mouth daily. Patient takes 6,5,4,3,2,1       Scheduled:   .  ceFAZolin (ANCEF) IV  1 g Intravenous Q8H  . famotidine (PEPCID) IV  20 mg Intravenous Q12H  . heparin  5,000 Units Subcutaneous Q8H  . pantoprazole (PROTONIX) IV  40 mg Intravenous Q24H  . DISCONTD: simethicone  80 mg Oral QID   Continuous:   . sodium chloride 125 mL/hr at 11/07/11 1135  . DISCONTD: dextrose 5 % and 0.9% NaCl 125 mL/hr (11/06/11 1946)   UJW:[id number], acetaminophen, HYDROmorphone, ondansetron (ZOFRAN) IV, ondansetron  Results for orders placed during the hospital encounter of 11/06/11 (from the past 48 hour(s))  GLUCOSE, CAPILLARY     Status: Abnormal   Collection Time   11/06/11  8:37 AM      Component Value Range Comment   Glucose-Capillary 63 (*) 70 - 99 (mg/dL)   CBC  Status: Abnormal   Collection Time   11/06/11  9:15 AM      Component Value Range Comment   WBC 22.6 (*) 4.0 - 10.5 (K/uL)    RBC 4.60  4.22 - 5.81 (MIL/uL)    Hemoglobin 14.7  13.0 - 17.0 (g/dL)    HCT 40.9  81.1 - 91.4 (%)    MCV 97.2  78.0 - 100.0 (fL)    MCH 32.0  26.0 - 34.0 (pg)    MCHC 32.9  30.0 - 36.0 (g/dL)    RDW 78.2  95.6 - 21.3 (%)    Platelets 159  150 - 400 (K/uL)   DIFFERENTIAL     Status: Abnormal   Collection Time   11/06/11  9:15 AM      Component Value Range Comment   Neutrophils Relative 92 (*) 43 - 77 (%)    Neutro Abs 20.7 (*) 1.7 - 7.7 (K/uL)    Lymphocytes Relative 4 (*) 12 - 46 (%)    Lymphs Abs 0.9  0.7 - 4.0 (K/uL)    Monocytes Relative 4  3 - 12 (%)    Monocytes  Absolute 1.0  0.1 - 1.0 (K/uL)    Eosinophils Relative 0  0 - 5 (%)    Eosinophils Absolute 0.0  0.0 - 0.7 (K/uL)    Basophils Relative 0  0 - 1 (%)    Basophils Absolute 0.0  0.0 - 0.1 (K/uL)   COMPREHENSIVE METABOLIC PANEL     Status: Abnormal   Collection Time   11/06/11  9:15 AM      Component Value Range Comment   Sodium 136  135 - 145 (mEq/L)    Potassium 3.6  3.5 - 5.1 (mEq/L)    Chloride 104  96 - 112 (mEq/L)    CO2 18 (*) 19 - 32 (mEq/L)    Glucose, Bld 63 (*) 70 - 99 (mg/dL)    BUN 57 (*) 6 - 23 (mg/dL)    Creatinine, Ser 0.86 (*) 0.50 - 1.35 (mg/dL)    Calcium 8.3 (*) 8.4 - 10.5 (mg/dL)    Total Protein 6.2  6.0 - 8.3 (g/dL)    Albumin 2.9 (*) 3.5 - 5.2 (g/dL)    AST 578 (*) 0 - 37 (U/L)    ALT 61 (*) 0 - 53 (U/L)    Alkaline Phosphatase 71  39 - 117 (U/L)    Total Bilirubin 0.6  0.3 - 1.2 (mg/dL)    GFR calc non Af Amer 21 (*) >90 (mL/min)    GFR calc Af Amer 24 (*) >90 (mL/min)   LIPASE, BLOOD     Status: Abnormal   Collection Time   11/06/11  9:15 AM      Component Value Range Comment   Lipase 9 (*) 11 - 59 (U/L)   GLUCOSE, CAPILLARY     Status: Normal   Collection Time   11/06/11 10:31 AM      Component Value Range Comment   Glucose-Capillary 90  70 - 99 (mg/dL)   GLUCOSE, CAPILLARY     Status: Normal   Collection Time   11/06/11  1:06 PM      Component Value Range Comment   Glucose-Capillary 85  70 - 99 (mg/dL)   GLUCOSE, CAPILLARY     Status: Abnormal   Collection Time   11/06/11  9:45 PM      Component Value Range Comment   Glucose-Capillary 186 (*) 70 - 99 (mg/dL)   GLUCOSE, CAPILLARY  Status: Abnormal   Collection Time   11/07/11  2:25 AM      Component Value Range Comment   Glucose-Capillary 246 (*) 70 - 99 (mg/dL)   BASIC METABOLIC PANEL     Status: Abnormal   Collection Time   11/07/11  3:53 AM      Component Value Range Comment   Sodium 134 (*) 135 - 145 (mEq/L)    Potassium 3.9  3.5 - 5.1 (mEq/L)    Chloride 107  96 - 112 (mEq/L)    CO2 18  (*) 19 - 32 (mEq/L)    Glucose, Bld 280 (*) 70 - 99 (mg/dL)    BUN 61 (*) 6 - 23 (mg/dL)    Creatinine, Ser 1.47 (*) 0.50 - 1.35 (mg/dL)    Calcium 8.1 (*) 8.4 - 10.5 (mg/dL)    GFR calc non Af Amer 24 (*) >90 (mL/min)    GFR calc Af Amer 28 (*) >90 (mL/min)   CBC     Status: Abnormal   Collection Time   11/07/11  3:53 AM      Component Value Range Comment   WBC 11.1 (*) 4.0 - 10.5 (K/uL)    RBC 4.34  4.22 - 5.81 (MIL/uL)    Hemoglobin 13.8  13.0 - 17.0 (g/dL)    HCT 82.9  56.2 - 13.0 (%)    MCV 95.9  78.0 - 100.0 (fL)    MCH 31.8  26.0 - 34.0 (pg)    MCHC 33.2  30.0 - 36.0 (g/dL)    RDW 86.5  78.4 - 69.6 (%)    Platelets 147 (*) 150 - 400 (K/uL)   GLUCOSE, CAPILLARY     Status: Abnormal   Collection Time   11/07/11  6:33 AM      Component Value Range Comment   Glucose-Capillary 257 (*) 70 - 99 (mg/dL)   GLUCOSE, CAPILLARY     Status: Abnormal   Collection Time   11/07/11  7:33 AM      Component Value Range Comment   Glucose-Capillary 238 (*) 70 - 99 (mg/dL)    Comment 1 Documented in Chart      Comment 2 Notify RN     GLUCOSE, CAPILLARY     Status: Abnormal   Collection Time   11/07/11 12:03 PM      Component Value Range Comment   Glucose-Capillary 188 (*) 70 - 99 (mg/dL)    Comment 1 Documented in Chart      Comment 2 Notify RN       Dg Abd Portable 1v  11/07/2011  *RADIOLOGY REPORT*  Clinical Data: Abdominal distention and rigidity.  Diarrhea.  PORTABLE ABDOMEN - 1 VIEW  Comparison: None.  Findings: There is gaseous distention of small bowel and colon. Large amount of stool is seen in the cecum, which appears rather distended, measuring upwards of 13.6 cm.  IMPRESSION: Gaseous distention of small bowel and colon, with marked distention of the cecum.  Ileus is considered.  Colonic obstruction cannot be definitively excluded.  Original Report Authenticated By: Reyes Ivan, M.D.    Review of Systems  Constitutional: Positive for fever (102 tuesday) and chills. Negative  for weight loss.  HENT: Negative for neck pain.        HOH  Macular degeneration with very poor vision.   Eyes: Positive for blurred vision.  Respiratory: Positive for shortness of breath.   Cardiovascular:       DOE  Gastrointestinal: Positive for heartburn, nausea, vomiting,  abdominal pain and diarrhea. Negative for constipation and blood in stool.  Genitourinary: Negative for dysuria, urgency, frequency, hematuria and flank pain.       No urine output day of admission.  Musculoskeletal: Positive for back pain (chronic back pain, can't lie down due to back pain.). Negative for myalgias, joint pain and falls.  Skin:       RLE cellulitis  Neurological: Negative.   Endo/Heme/Allergies: Negative.   Psychiatric/Behavioral: Negative.    Blood pressure 142/68, pulse 75, temperature 98.2 F (36.8 C), temperature source Oral, resp. rate 18, height 5\' 9"  (1.753 m), weight 116.121 kg (256 lb), SpO2 96.00%. Physical Exam  Constitutional: He is oriented to person, place, and time. He appears well-developed and well-nourished. No distress.       Obese WM NAD  HENT:  Head: Normocephalic and atraumatic.  Nose: Nose normal.       NG in place  Eyes: Conjunctivae and EOM are normal. Pupils are equal, round, and reactive to light. No scleral icterus.  Neck: Normal range of motion. Neck supple. No JVD present. No tracheal deviation present. No thyromegaly present.  Cardiovascular: Normal rate, regular rhythm, normal heart sounds and intact distal pulses.  Exam reveals no gallop.   No murmur heard. Respiratory: Effort normal and breath sounds normal. No respiratory distress. He has no wheezes. He has no rales.  GI: He exhibits distension. He exhibits no mass. There is tenderness. There is no rebound and no guarding.       Distended and tender BS increased  Musculoskeletal: He exhibits edema (trace in legs) and tenderness (He has cellulits RLE).  Lymphadenopathy:    He has no cervical adenopathy.    Neurological: He is alert and oriented to person, place, and time. He has normal reflexes. No cranial nerve deficit.  Skin: Skin is warm and dry. No rash noted. He is not diaphoretic. There is erythema (RLE). No pallor.  Psychiatric: He has a normal mood and affect. His behavior is normal. Judgment and thought content normal.    Assessment/Plan: 1. Gastroenteritis 2. Severe abdominal distention with both large and small bowel dilatation; up to 13.6 cm. 3. Acute renal failure 4. And also diabetes mellitus 5. Diabetic retinopathy, macular degeneration. 6. Chronic back pain recently on steroids. 7. GERD 8. Hypertension 9. Probable sleep apnea 10. History of appendectomy  Plan: This was seen and evaluated by Dr. Jamey Ripa. At this point we'll follow with Dr. Andrey Campanile. The patient may need a diagnostic/ decompressive colonoscopy with further evaluation as needed. [facility] physician assistant for Dr. Cyndia Bent  Kalyn Nawrot 11/07/2011, 2:47 PM

## 2011-11-07 NOTE — Progress Notes (Addendum)
IInpatient Diabetes Program Recommendations  AACE/ADA: New Consensus Statement on Inpatient Glycemic Control  Target Ranges:  Prepandial:   less than 140 mg/dL      Peak postprandial:   less than 180 mg/dL (1-2 hours)      Critically ill patients:  140 - 180 mg/dL  Pager:  [phone] Hours:  8 am-10pm   Reason for Visit: Elevated fasting glucose due to lack of basal insulin:  280 mg/dL  Inpatient Diabetes Program Recommendations Insulin - Basal: Add Lantus to insulin regimen:  Consider 1/2 home dose:  32 units daily of Lantus due to low after giving 40 units on 4-9 Correction (SSI): Add Novolog Correction

## 2011-11-07 NOTE — Progress Notes (Signed)
Gastroenteritis  Subjective: Patient asleep when I came in. Woke him - he says he feels a bit better with the ng. Minimal pain, no nausea  Objective: Vital signs in last 24 hours: Temp:  [98.2 F (36.8 C)-98.7 F (37.1 C)] 98.3 F (36.8 C) (04/11 1530) Pulse Rate:  [62-81] 62  (04/11 1530) Resp:  [18-20] 18  (04/11 1530) BP: (142-169)/(65-70) 169/70 mmHg (04/11 1530) SpO2:  [95 %-99 %] 99 % (04/11 1530) Last BM Date: 11/06/11  Intake/Output from previous day: 04/10 0701 - 04/11 0700 In: 0  Out: 650 [Urine:650] Intake/Output this shift: Total I/O In: 1250 [I.V.:1200; IV Piggyback:50] Out: 1100 [Urine:900; Emesis/NG output:200]  General appearance: alert, cooperative and no distress GI: distended, soft, mild rlq tender, no rebound  Lab Results:  Results for orders placed during the hospital encounter of 11/06/11 (from the past 24 hour(s))  GLUCOSE, CAPILLARY     Status: Abnormal   Collection Time   11/06/11  9:45 PM      Component Value Range   Glucose-Capillary 186 (*) 70 - 99 (mg/dL)  GLUCOSE, CAPILLARY     Status: Abnormal   Collection Time   11/07/11  2:25 AM      Component Value Range   Glucose-Capillary 246 (*) 70 - 99 (mg/dL)  BASIC METABOLIC PANEL     Status: Abnormal   Collection Time   11/07/11  3:53 AM      Component Value Range   Sodium 134 (*) 135 - 145 (mEq/L)   Potassium 3.9  3.5 - 5.1 (mEq/L)   Chloride 107  96 - 112 (mEq/L)   CO2 18 (*) 19 - 32 (mEq/L)   Glucose, Bld 280 (*) 70 - 99 (mg/dL)   BUN 61 (*) 6 - 23 (mg/dL)   Creatinine, Ser 9.14 (*) 0.50 - 1.35 (mg/dL)   Calcium 8.1 (*) 8.4 - 10.5 (mg/dL)   GFR calc non Af Amer 24 (*) >90 (mL/min)   GFR calc Af Amer 28 (*) >90 (mL/min)  CBC     Status: Abnormal   Collection Time   11/07/11  3:53 AM      Component Value Range   WBC 11.1 (*) 4.0 - 10.5 (K/uL)   RBC 4.34  4.22 - 5.81 (MIL/uL)   Hemoglobin 13.8  13.0 - 17.0 (g/dL)   HCT 78.2  95.6 - 21.3 (%)   MCV 95.9  78.0 - 100.0 (fL)   MCH 31.8   26.0 - 34.0 (pg)   MCHC 33.2  30.0 - 36.0 (g/dL)   RDW 08.6  57.8 - 46.9 (%)   Platelets 147 (*) 150 - 400 (K/uL)  GLUCOSE, CAPILLARY     Status: Abnormal   Collection Time   11/07/11  6:33 AM      Component Value Range   Glucose-Capillary 257 (*) 70 - 99 (mg/dL)  GLUCOSE, CAPILLARY     Status: Abnormal   Collection Time   11/07/11  7:33 AM      Component Value Range   Glucose-Capillary 238 (*) 70 - 99 (mg/dL)   Comment 1 Documented in Chart     Comment 2 Notify RN    GLUCOSE, CAPILLARY     Status: Abnormal   Collection Time   11/07/11 12:03 PM      Component Value Range   Glucose-Capillary 188 (*) 70 - 99 (mg/dL)   Comment 1 Documented in Chart     Comment 2 Notify RN       Studies/Results Radiology  MEDS, Scheduled    .  ceFAZolin (ANCEF) IV  1 g Intravenous Q8H  . famotidine (PEPCID) IV  20 mg Intravenous Q12H  . heparin  5,000 Units Subcutaneous Q8H  . pantoprazole (PROTONIX) IV  40 mg Intravenous Q24H  . DISCONTD: simethicone  80 mg Oral QID     Assessment: Gastroenteritis Colon ileus or possible distal colonic obstruction. Cecum dilated to 13cm.  Plan: Agree with current plans for NG. If does not improve may need colonoscopy to be sure there is not a distal obstruction and if not, to try to decompress. Not toxic at this point and this may improve with NG and time  LOS: 1 day    Currie Paris, MD, [facility], [place] [phone]   11/07/2011 4:28 PM

## 2011-11-07 NOTE — Progress Notes (Signed)
Subjective: Admission history and physical reviewed, apparently patient admitted with nausea vomiting and diarrhea, his wife had similar symptoms. Hers has resolved. Surprisingly they both ate from a salad bar over the weekend. He denies any seafood, denies any well water or recent antibiotics. On admission he had significant leukocytosis and also was found to have erythema of his right lower extremity felt to be cellulitis. He also had distention of his abdomen, imaging was not obtained, x-ray ordered today shows diffuse distention of small and large bowel, cannot exclude obstruction and shows marked distention of the cecum. Currently patient denies nausea, has not had any vomiting since admitted  Objective: Vital signs in last 24 hours: Temp:  [98.2 F (36.8 C)-99.8 F (37.7 C)] 98.2 F (36.8 C) (04/11 0630) Pulse Rate:  [65-81] 75  (04/11 0630) Resp:  [18-20] 18  (04/11 0630) BP: (138-154)/(44-68) 142/68 mmHg (04/11 0630) SpO2:  [95 %-96 %] 96 % (04/11 0630) Weight change:  Last BM Date: 11/06/11  Intake/Output from previous day: 04/10 0701 - 04/11 0700 In: 0  Out: 650 [Urine:650] Intake/Output this shift:    General appearance: patient appears slightly uncomfortable Resp: clear to auscultation bilaterally Cardio: regular rate and rhythm, S1, S2 normal, no murmur, click, rub or gallop GI: abdomen distended, high-pitched bowel sounds no guarding no rebound tenderness Extremities: right lower extremity somewhat erythematous and tender  Lab Results:  Results for orders placed during the hospital encounter of 11/06/11 (from the past 24 hour(s))  GLUCOSE, CAPILLARY     Status: Normal   Collection Time   11/06/11 10:31 AM      Component Value Range   Glucose-Capillary 90  70 - 99 (mg/dL)  GLUCOSE, CAPILLARY     Status: Normal   Collection Time   11/06/11  1:06 PM      Component Value Range   Glucose-Capillary 85  70 - 99 (mg/dL)  GLUCOSE, CAPILLARY     Status: Abnormal   Collection Time   11/06/11  9:45 PM      Component Value Range   Glucose-Capillary 186 (*) 70 - 99 (mg/dL)  GLUCOSE, CAPILLARY     Status: Abnormal   Collection Time   11/07/11  2:25 AM      Component Value Range   Glucose-Capillary 246 (*) 70 - 99 (mg/dL)  BASIC METABOLIC PANEL     Status: Abnormal   Collection Time   11/07/11  3:53 AM      Component Value Range   Sodium 134 (*) 135 - 145 (mEq/L)   Potassium 3.9  3.5 - 5.1 (mEq/L)   Chloride 107  96 - 112 (mEq/L)   CO2 18 (*) 19 - 32 (mEq/L)   Glucose, Bld 280 (*) 70 - 99 (mg/dL)   BUN 61 (*) 6 - 23 (mg/dL)   Creatinine, Ser 4.78 (*) 0.50 - 1.35 (mg/dL)   Calcium 8.1 (*) 8.4 - 10.5 (mg/dL)   GFR calc non Af Amer 24 (*) >90 (mL/min)   GFR calc Af Amer 28 (*) >90 (mL/min)  CBC     Status: Abnormal   Collection Time   11/07/11  3:53 AM      Component Value Range   WBC 11.1 (*) 4.0 - 10.5 (K/uL)   RBC 4.34  4.22 - 5.81 (MIL/uL)   Hemoglobin 13.8  13.0 - 17.0 (g/dL)   HCT 29.5  62.1 - 30.8 (%)   MCV 95.9  78.0 - 100.0 (fL)   MCH 31.8  26.0 - 34.0 (pg)  MCHC 33.2  30.0 - 36.0 (g/dL)   RDW 04.5  40.9 - 81.1 (%)   Platelets 147 (*) 150 - 400 (K/uL)  GLUCOSE, CAPILLARY     Status: Abnormal   Collection Time   11/07/11  6:33 AM      Component Value Range   Glucose-Capillary 257 (*) 70 - 99 (mg/dL)  GLUCOSE, CAPILLARY     Status: Abnormal   Collection Time   11/07/11  7:33 AM      Component Value Range   Glucose-Capillary 238 (*) 70 - 99 (mg/dL)   Comment 1 Documented in Chart     Comment 2 Notify RN        Studies/Results: Dg Abd Portable 1v  11/07/2011  *RADIOLOGY REPORT*  Clinical Data: Abdominal distention and rigidity.  Diarrhea.  PORTABLE ABDOMEN - 1 VIEW  Comparison: None.  Findings: There is gaseous distention of small bowel and colon. Large amount of stool is seen in the cecum, which appears rather distended, measuring upwards of 13.6 cm.  IMPRESSION: Gaseous distention of small bowel and colon, with marked distention  of the cecum.  Ileus is considered.  Colonic obstruction cannot be definitively excluded.  Original Report Authenticated By: Reyes Ivan, M.D.    Medications:  Prior to Admission:  Prescriptions prior to admission  Medication Sig Dispense Refill  . aspirin 81 MG chewable tablet Chew 81 mg by mouth daily.      . furosemide (LASIX) 20 MG tablet Take 20 mg by mouth 2 (two) times daily.      . insulin glargine (LANTUS) 100 UNIT/ML injection Inject 65 Units into the skin daily.      Marland Kitchen lisinopril-hydrochlorothiazide (PRINZIDE,ZESTORETIC) 10-12.5 MG per tablet Take 1 tablet by mouth daily.      . methocarbamol (ROBAXIN) 750 MG tablet Take 750 mg by mouth every 8 (eight) hours as needed. For pain      . omeprazole (PRILOSEC) 20 MG capsule Take 20 mg by mouth daily.      . predniSONE (DELTASONE) 10 MG tablet Take 10 mg by mouth daily. Patient takes 6,5,4,3,2,1       Scheduled:   .  ceFAZolin (ANCEF) IV  1 g Intravenous Q8H  . famotidine (PEPCID) IV  20 mg Intravenous Q12H  . heparin  5,000 Units Subcutaneous Q8H  .  morphine injection  2 mg Intravenous Once  . pantoprazole (PROTONIX) IV  40 mg Intravenous Q24H  . simethicone  40 mg Oral Once  . sodium chloride  500 mL Intravenous Once  . DISCONTD: sodium chloride   Intravenous Once  . DISCONTD: simethicone  80 mg Oral QID  . DISCONTD: sodium chloride  500 mL Intravenous Once   Continuous:   . sodium chloride 125 mL/hr at 11/07/11 0303  . DISCONTD: dextrose 5 % and 0.9% NaCl 125 mL/hr (11/06/11 1946)    Assessment/Plan: Nausea vomiting diarrhea rule out Noravirus, patient has significant abnormality on imaging, could be related to GI bug however obstruction is not excluded and patient has significant distention of his cecum. Gastroenterology consult has been ordered as well as surgical consult because of dilatation of small bowel. Currently there is no emesis, no need for NG tube at this time.we will keep n.p.o. And await further input  from GI, continue IV fluids. Leukocytosis significantly improved, could be related to current GI of the, stressed margination as well as right looks early cellulitis.also patient recently was on a steroid taper for low back pain which could contribute  to his leukocytosis. Chronic kidney disease baseline creatinine approximately 2 Hypertension Diabetes, currently patient is n.p.o. Sliding scale is on will be resume as soon as prudent  LOS: 1 day   Yajaira Doffing D 11/07/2011, 10:05 AM

## 2011-11-07 NOTE — Progress Notes (Signed)
Dr. Nehemiah Settle notified about patients cbg of [id number].

## 2011-11-07 NOTE — Progress Notes (Signed)
Dr. Nehemiah Settle notified re: results of abdominal x-ray, MD said to keep patient NPO, simethicone d/c'd

## 2011-11-07 NOTE — Consult Note (Signed)
Subjective:   HPI  The patient is an 78 year old male who we are asked to see in consultation for abdominal distention and probable ileus. Several days ago he developed nausea vomiting and abdominal distention. It was also reported by his wife that he had a fever. She had similar symptoms but these resolved quickly. He persisted in having nausea vomiting and diarrhea and finally came to the hospital where he was subsequently admitted. An abdominal x-ray done today shows evidence of gaseous distention of the small bowel as well as cecum and ascending colon regions.In reviewing these films with the radiologist it suggests that we are dealing with a probable ileus but a more distal colonic lesion causing obstruction could not be excluded. The patient states that his abdomen does feel distended however it is better than it was a day or 2 ago. He is belching a lot. He states he had a colonoscopy a few years ago which was normal which is reassuring.  Review of Systems He denies chest pain or shortness of breath  Past Medical History  Diagnosis Date  . Diabetes mellitus   . Diabetic retinopathy   . Macular degeneration   . Diabetic neuropathy   . HTN (hypertension)   . GERD (gastroesophageal reflux disease)   . Back pain   . Previous back surgery 11/06/2011  . S/P appendectomy 11/06/2011   Past Surgical History  Procedure Date  . Brain surgery    History   Social History  . Marital Status: Single    Spouse Name: N/A    Number of Children: N/A  . Years of Education: N/A   Occupational History  . Not on file.   Social History Main Topics  . Smoking status: Never Smoker   . Smokeless tobacco: Never Used  . Alcohol Use: No  . Drug Use: No  . Sexually Active: No   Other Topics Concern  . Not on file   Social History Narrative  . No narrative on file   family history is not on file. Current facility-administered medications:0.9 %  sodium chloride infusion, , Intravenous, Continuous,  Leanne Chang, NP, Last Rate: 125 mL/hr at 11/07/11 1135;  acetaminophen (TYLENOL) suppository 650 mg, 650 mg, Rectal, Q6H PRN, Calvert Cantor, MD;  acetaminophen (TYLENOL) tablet 650 mg, 650 mg, Oral, Q6H PRN, Calvert Cantor, MD;  ceFAZolin (ANCEF) IVPB 1 g/50 mL premix, 1 g, Intravenous, Q8H, Saima Rizwan, MD, 1 g at 11/07/11 0648 famotidine (PEPCID) IVPB 20 mg, 20 mg, Intravenous, Q12H, Calvert Cantor, MD, 20 mg at 11/07/11 0922;  heparin injection 5,000 Units, 5,000 Units, Subcutaneous, Q8H, Calvert Cantor, MD, 5,000 Units at 11/07/11 4696;  HYDROmorphone (DILAUDID) injection 1 mg, 1 mg, Intravenous, Q4H PRN, Calvert Cantor, MD, 1 mg at 11/07/11 0656;  ondansetron (ZOFRAN) injection 4 mg, 4 mg, Intravenous, Q6H PRN, Calvert Cantor, MD, 4 mg at 11/06/11 1948 ondansetron (ZOFRAN) tablet 4 mg, 4 mg, Oral, Q6H PRN, Calvert Cantor, MD;  pantoprazole (PROTONIX) injection 40 mg, 40 mg, Intravenous, Q24H, Saima Rizwan, MD;  DISCONTD: 0.9 %  sodium chloride infusion, , Intravenous, Once, Loren Racer, MD;  DISCONTD: dextrose 5 %-0.9 % sodium chloride infusion, , Intravenous, Continuous, Calvert Cantor, MD, Last Rate: 125 mL/hr at 11/06/11 1946, 125 mL/hr at 11/06/11 1946 DISCONTD: simethicone (MYLICON) chewable tablet 80 mg, 80 mg, Oral, QID, Calvert Cantor, MD, 80 mg at 11/06/11 2201;  DISCONTD: sodium chloride 0.9 % bolus 500 mL, 500 mL, Intravenous, Once, Loren Racer, MD No Known Allergies   Objective:  BP 142/68  Pulse 75  Temp(Src) 98.2 F (36.8 C) (Oral)  Resp 18  Ht 5\' 9"  (1.753 m)  Wt 116.121 kg (256 lb)  BMI 37.80 kg/m2  SpO2 96%  Alert and oriented  No acute distress  Heart regular rhythm no murmurs  Lungs clear  Abdomen bowel sounds are diminished with some tinkling sounds, it is soft, distended but not hard, and not tender  X-ray reveals the cecum to be about 13.7 cm in diameter Lab Results  Component Value Date   HGB 13.8 11/07/2011   HGB 14.7 11/06/2011   HCT 41.6 11/07/2011    HCT 44.7 11/06/2011   ALKPHOS 71 11/06/2011   AST 130* 11/06/2011   ALT 61* 11/06/2011      Laboratory No components found with this basename: d1      Assessment:     Ileus      Plan:     I would recommend continuing supportive care. Concerning an NG tube would be appropriate at this time and providing low intermittent suction to decompress the bowel. At this time I don't think that colonoscopic intervention with decompression is necessary. We will follow him clinically.

## 2011-11-07 NOTE — Progress Notes (Signed)
CHART REVIEWED; B Shafer Swamy RN, BSN, MHA 

## 2011-11-07 NOTE — Progress Notes (Signed)
DR Beverely Risen ordered NGT to [facility],  Successfully placed by this writer,patient tolerated the procedure, noted dark brown discharges in the canister.

## 2011-11-07 NOTE — Consult Note (Signed)
Agree with note of WJ,PA. Also see my note from today

## 2011-11-08 ENCOUNTER — Encounter (HOSPITAL_COMMUNITY)
Admission: EM | Disposition: A | Discharge status: Home Health | Payer: Self-pay | Source: Home / Self Care | Attending: Internal Medicine

## 2011-11-08 ENCOUNTER — Inpatient Hospital Stay (HOSPITAL_COMMUNITY): Payer: Medicare Other

## 2011-11-08 ENCOUNTER — Encounter (HOSPITAL_COMMUNITY): Payer: Self-pay | Admitting: *Deleted

## 2011-11-08 HISTORY — PX: COLONOSCOPY: SHX5424

## 2011-11-08 LAB — CBC
Hemoglobin: 12.7 g/dL — ABNORMAL LOW (ref 13.0–17.0)
MCHC: 32.5 g/dL (ref 30.0–36.0)
RDW: 14.3 % (ref 11.5–15.5)

## 2011-11-08 LAB — COMPREHENSIVE METABOLIC PANEL
ALT: 53 U/L (ref 0–53)
Albumin: 2.3 g/dL — ABNORMAL LOW (ref 3.5–5.2)
Alkaline Phosphatase: 68 U/L (ref 39–117)
Potassium: 4 mEq/L (ref 3.5–5.1)
Sodium: 137 mEq/L (ref 135–145)
Total Protein: 5.2 g/dL — ABNORMAL LOW (ref 6.0–8.3)

## 2011-11-08 LAB — GLUCOSE, CAPILLARY
Glucose-Capillary: 111 mg/dL — ABNORMAL HIGH (ref 70–99)
Glucose-Capillary: 115 mg/dL — ABNORMAL HIGH (ref 70–99)
Glucose-Capillary: 81 mg/dL (ref 70–99)

## 2011-11-08 SURGERY — COLONOSCOPY
Anesthesia: Moderate Sedation

## 2011-11-08 MED ORDER — FAT EMULSION 20 % IV EMUL
240.0000 mL | INTRAVENOUS | Status: AC
  Administered 2011-11-09: 240 mL via INTRAVENOUS
  Filled 2011-11-08: qty 250

## 2011-11-08 MED ORDER — SODIUM CHLORIDE 0.9 % IJ SOLN
10.0000 mL | Freq: Two times a day (BID) | INTRAMUSCULAR | Status: DC
  Administered 2011-11-08 – 2011-11-12 (×2): 10 mL

## 2011-11-08 MED ORDER — SODIUM CHLORIDE 0.9 % IJ SOLN: 10.0000 mL | INTRAMUSCULAR | Status: DC | PRN

## 2011-11-08 MED ORDER — SODIUM CHLORIDE 0.9 % IV SOLN
INTRAVENOUS | Status: AC
  Administered 2011-11-09: 22:00:00 via INTRAVENOUS

## 2011-11-08 MED ORDER — CLINIMIX E/DEXTROSE (5/20) 5 % IV SOLN
INTRAVENOUS | Status: AC
  Administered 2011-11-09: 19:00:00 via INTRAVENOUS
  Filled 2011-11-08: qty 1000

## 2011-11-08 MED ORDER — PANTOPRAZOLE SODIUM 40 MG IV SOLR
40.0000 mg | Freq: Every day | INTRAVENOUS | Status: DC
  Administered 2011-11-09 – 2011-11-12 (×4): 40 mg via INTRAVENOUS
  Filled 2011-11-08 (×5): qty 40

## 2011-11-08 MED ORDER — MIDAZOLAM HCL 5 MG/5ML IJ SOLN
INTRAMUSCULAR | Status: DC | PRN
  Administered 2011-11-08 (×2): 1 mg via INTRAVENOUS

## 2011-11-08 MED ORDER — POLYETHYLENE GLYCOL 3350 17 G PO PACK
17.0000 g | PACK | Freq: Every day | ORAL | Status: DC
  Administered 2011-11-08 – 2011-11-11 (×4): 17 g via ORAL
  Filled 2011-11-08 (×5): qty 1

## 2011-11-08 MED ORDER — FENTANYL NICU IV SYRINGE 50 MCG/ML
INJECTION | INTRAMUSCULAR | Status: DC | PRN
  Administered 2011-11-08 (×3): 12.5 ug via INTRAVENOUS

## 2011-11-08 MED ORDER — INSULIN ASPART 100 UNIT/ML ~~LOC~~ SOLN
0.0000 [IU] | SUBCUTANEOUS | Status: DC
  Administered 2011-11-10 (×2): 2 [IU] via SUBCUTANEOUS

## 2011-11-08 MED ORDER — TRAVASOL 10 % IV SOLN: INTRAVENOUS | Status: DC

## 2011-11-08 NOTE — Progress Notes (Addendum)
PARENTERAL NUTRITION CONSULT NOTE - INITIAL  Pharmacy Consult for TNA Indication: Prolonged NPO 2/2 colonic distention  No Known Allergies  Patient Measurements: Height: 5\' 9"  (175.3 cm) Weight: 256 lb (116.121 kg) IBW/kg (Calculated) : 70.7  Adjusted Body Weight: 88.8 kg  Vital Signs: Temp: 97.2 F (36.2 C) (04/12 1242) Temp src: Oral (04/12 1242) BP: 161/54 mmHg (04/12 1242) Pulse Rate: 59  (04/12 1242) Intake/Output from previous day: 04/11 0701 - 04/12 0700 In: 1250 [I.V.:1200; IV Piggyback:50] Out: 1800 [Urine:1500; Emesis/NG output:300] Intake/Output from this shift: Total I/O In: 1300 [I.V.:1250; IV Piggyback:50] Out: 100 [Emesis/NG output:100]  Labs:  [facility] 11/08/11 0343 11/07/11 0353 11/06/11 0915  WBC 9.7 11.1* 22.6*  HGB 12.7* 13.8 14.7  HCT 39.1 41.6 44.7  PLT 141* 147* 159  APTT -- -- --  INR -- -- --     Basename 11/08/11 0343 11/07/11 0353 11/06/11 0915  NA 137 134* 136  K 4.0 3.9 3.6  CL 111 107 104  CO2 19 18* 18*  GLUCOSE 109* 280* 63*  BUN 43* 61* 57*  CREATININE 1.66* 2.34* 2.64*  LABCREA -- -- --  CREAT24HRUR -- -- --  CALCIUM 7.9* 8.1* 8.3*  MG 2.1 -- --  PHOS -- -- --  PROT 5.2* -- 6.2  ALBUMIN 2.3* -- 2.9*  AST 60* -- 130*  ALT 53 -- 61*  ALKPHOS 68 -- 71  BILITOT 0.3 -- 0.6  BILIDIR -- -- --  IBILI -- -- --  PREALBUMIN -- -- --  TRIG -- -- --  CHOLHDL -- -- --  CHOL -- -- --   Estimated Creatinine Clearance: 41.7 ml/min (by C-G formula based on Cr of 1.66).    Basename 11/08/11 1717 11/08/11 1236 11/08/11 0754  GLUCAP 81 97 111*    Medical History: Past Medical History  Diagnosis Date  . Diabetes mellitus   . Diabetic retinopathy   . Macular degeneration   . Diabetic neuropathy   . HTN (hypertension)   . GERD (gastroesophageal reflux disease)   . Back pain   . Previous back surgery 11/06/2011  . S/P appendectomy 11/06/2011    Insulin Requirements in the past 24 hours:  none  Current Nutrition:   NPO  Assessment:  86yom with abdominal distention with small and large bowel dilatation s/p colonoscopy with attempt at decompression today.  Continuing NG suction and NPO.  Beginning parenteral nutrition 4/13.  NG output recorded at only 100 ml today.  SCr elevated but has been trending down.  Albumin 2.3, Corr Ca++ 9.26  AST/ALT were elevated but now trending down.  Other labs, including Mag/K+ WNL.  Phosphorus level has not been drawn and will order for AM before TNA begins.  Nutritional Goals:  2398 kCal, 106 grams of protein per day  Plan:  At 1800 tomorrow:  Start Clinimix E 5/20 at 40 ml/hr  Fat emulsion at 10 ml/hr.  Plan to advance as tolerated to a goal rate of 88 ml/hr + 10 ml/hr to provide: 106 g/day protein and 2338 Kcal/day.  TNA to contain standard multivitamins and trace elements(MWF only due to ongoing shortage).  Reduce IVF to 85 ml/hr.  Add sensitive SSI q4h.  Patient has history of DM.  TNA lab panels on Mondays & Thursdays.  F/u daily.  Clance Boll 11/08/2011,5:28 PM   Addendum:   A/P: Labs stable today 4/13 compared to 4/12 labs. Continue with plan for TNA as above.   Hessie Knows, PharmD, BCPS pager [phone] 11/09/2011 7:40  AM

## 2011-11-08 NOTE — Op Note (Signed)
[facility] [address] [place], [place]  [address]  COLONOSCOPY PROCEDURE REPORT  PATIENT:  Ronald Lindsey, Ronald Lindsey  MR#:  [id number] BIRTHDATE:  1924-11-26, 86 yrs. old  GENDER:  male ENDOSCOPIST:  Wandalee Ferdinand, MD REF. BY: PROCEDURE DATE:  11/08/2011 PROCEDURE: Colonoscopy with attempt at decompression ASA CLASS: 2 INDICATIONS: Colonic distention MEDICATIONS:  Fentanyl 37.5 mcg IV, Versed 2 mg IV  DESCRIPTION OF PROCEDURE:   After the risks benefits and alternatives of the procedure were thoroughly explained, informed consent was obtained.  A rectal exam was performed and no masses were felt. The [id number]) endoscope was introduced through the anus and advanced to the transverse colon area. No prep was given. <<PROCEDUREIMAGES>>  FINDINGS:  There were no obvious lesions seen in the colon examined. There was a tremendous amount of solid formed stool which could not be traversed and clogged the scope as well as occluded view. No lesions were seen in the colon examined as I brought the scope out I suctioned as much air as I could however it did not appear to have much effect on his overall abdominal distention. COMPLICATIONS:  None ENDOSCOPIC IMPRESSION: See above. The scope could not be advanced to the cecum or the right colon where the majority of air and distention were seen on x-ray due to so much stool in the colon.  RECOMMENDATIONS: Continue NG suction. Add MiraLAX 17 g daily per NG tube to try to clean his colon out.  REPEAT EXAM:  No  ______________________________ Wandalee Ferdinand, MD  CC:  n. eSIGNED:   Sam Kineta Fudala at 11/08/2011 11:23 AM  Halford Chessman, [phone]

## 2011-11-08 NOTE — Progress Notes (Signed)
As per note of WJ,PA. He is still distended this afternoon, slt RLQ tender but no peritoneal signs. Attempted colonoscopy earlier today not successful secondary to too much stool. Will continue to follow

## 2011-11-08 NOTE — Progress Notes (Signed)
Subjective: Patient complain about NG tube, there is no emesis, he has not passed any stool however states he has some gas. GI and  surgical input appreciated. Patient denies fever chills. There are no specific complaints from nursing. Patient's wife has been updated  Objective: Vital signs in last 24 hours: Temp:  [98.3 F (36.8 C)-98.9 F (37.2 C)] 98.9 F (37.2 C) (04/12 0512) Pulse Rate:  [62-68] 68  (04/12 0512) Resp:  [18] 18  (04/12 0512) BP: (169-189)/(51-74) 183/51 mmHg (04/12 0512) SpO2:  [98 %-99 %] 98 % (04/12 0512) Weight change:  Last BM Date: 11/06/11  Intake/Output from previous day: 04/11 0701 - 04/12 0700 In: 1250 [I.V.:1200; IV Piggyback:50] Out: 1800 [Urine:1500; Emesis/NG output:300] Intake/Output this shift:    General appearance: alert Resp: clear to auscultation bilaterally Cardio: regular rate and rhythm, S1, S2 normal, no murmur, click, rub or gallop GI: abdomen distended, tympanic, decreased bowel sounds Extremities: right lower extremity with erythema, decreased warmth decreased tenderness  Lab Results:  Results for orders placed during the hospital encounter of 11/06/11 (from the past 24 hour(s))  GLUCOSE, CAPILLARY     Status: Abnormal   Collection Time   11/07/11 12:03 PM      Component Value Range   Glucose-Capillary 188 (*) 70 - 99 (mg/dL)   Comment 1 Documented in Chart     Comment 2 Notify RN    URINALYSIS, ROUTINE W REFLEX MICROSCOPIC     Status: Abnormal   Collection Time   11/07/11  4:31 PM      Component Value Range   Color, Urine YELLOW  YELLOW    APPearance CLEAR  CLEAR    Specific Gravity, Urine 1.022  1.005 - 1.030    pH 6.0  5.0 - 8.0    Glucose, UA 250 (*) NEGATIVE (mg/dL)   Hgb urine dipstick TRACE (*) NEGATIVE    Bilirubin Urine NEGATIVE  NEGATIVE    Ketones, ur NEGATIVE  NEGATIVE (mg/dL)   Protein, ur 30 (*) NEGATIVE (mg/dL)   Urobilinogen, UA 0.2  0.0 - 1.0 (mg/dL)   Nitrite NEGATIVE  NEGATIVE    Leukocytes, UA  NEGATIVE  NEGATIVE   URINE MICROSCOPIC-ADD ON     Status: Abnormal   Collection Time   11/07/11  4:31 PM      Component Value Range   RBC / HPF 0-2  <3 (RBC/hpf)   Casts GRANULAR CAST (*) NEGATIVE   GLUCOSE, CAPILLARY     Status: Abnormal   Collection Time   11/07/11  4:36 PM      Component Value Range   Glucose-Capillary 163 (*) 70 - 99 (mg/dL)   Comment 1 Documented in Chart     Comment 2 Notify RN    GLUCOSE, CAPILLARY     Status: Abnormal   Collection Time   11/07/11 10:15 PM      Component Value Range   Glucose-Capillary 125 (*) 70 - 99 (mg/dL)  GLUCOSE, CAPILLARY     Status: Abnormal   Collection Time   11/08/11 12:26 AM      Component Value Range   Glucose-Capillary 115 (*) 70 - 99 (mg/dL)  CBC     Status: Abnormal   Collection Time   11/08/11  3:43 AM      Component Value Range   WBC 9.7  4.0 - 10.5 (K/uL)   RBC 4.11 (*) 4.22 - 5.81 (MIL/uL)   Hemoglobin 12.7 (*) 13.0 - 17.0 (g/dL)   HCT 16.1  09.6 -  52.0 (%)   MCV 95.1  78.0 - 100.0 (fL)   MCH 30.9  26.0 - 34.0 (pg)   MCHC 32.5  30.0 - 36.0 (g/dL)   RDW 78.2  95.6 - 21.3 (%)   Platelets 141 (*) 150 - 400 (K/uL)  COMPREHENSIVE METABOLIC PANEL     Status: Abnormal   Collection Time   11/08/11  3:43 AM      Component Value Range   Sodium 137  135 - 145 (mEq/L)   Potassium 4.0  3.5 - 5.1 (mEq/L)   Chloride 111  96 - 112 (mEq/L)   CO2 19  19 - 32 (mEq/L)   Glucose, Bld 109 (*) 70 - 99 (mg/dL)   BUN 43 (*) 6 - 23 (mg/dL)   Creatinine, Ser 0.86 (*) 0.50 - 1.35 (mg/dL)   Calcium 7.9 (*) 8.4 - 10.5 (mg/dL)   Total Protein 5.2 (*) 6.0 - 8.3 (g/dL)   Albumin 2.3 (*) 3.5 - 5.2 (g/dL)   AST 60 (*) 0 - 37 (U/L)   ALT 53  0 - 53 (U/L)   Alkaline Phosphatase 68  39 - 117 (U/L)   Total Bilirubin 0.3  0.3 - 1.2 (mg/dL)   GFR calc non Af Amer 36 (*) >90 (mL/min)   GFR calc Af Amer 42 (*) >90 (mL/min)  MAGNESIUM     Status: Normal   Collection Time   11/08/11  3:43 AM      Component Value Range   Magnesium 2.1  1.5 - 2.5  (mg/dL)  GLUCOSE, CAPILLARY     Status: Normal   Collection Time   11/08/11  5:12 AM      Component Value Range   Glucose-Capillary 96  70 - 99 (mg/dL)  GLUCOSE, CAPILLARY     Status: Abnormal   Collection Time   11/08/11  7:54 AM      Component Value Range   Glucose-Capillary 111 (*) 70 - 99 (mg/dL)      Studies/Results: Dg Abd Portable 1v  11/07/2011  *RADIOLOGY REPORT*  Clinical Data: Abdominal distention and rigidity.  Diarrhea.  PORTABLE ABDOMEN - 1 VIEW  Comparison: None.  Findings: There is gaseous distention of small bowel and colon. Large amount of stool is seen in the cecum, which appears rather distended, measuring upwards of 13.6 cm.  IMPRESSION: Gaseous distention of small bowel and colon, with marked distention of the cecum.  Ileus is considered.  Colonic obstruction cannot be definitively excluded.  Original Report Authenticated By: Reyes Ivan, M.D.    Medications:  Prior to Admission:  Prescriptions prior to admission  Medication Sig Dispense Refill  . aspirin 81 MG chewable tablet Chew 81 mg by mouth daily.      . furosemide (LASIX) 20 MG tablet Take 20 mg by mouth 2 (two) times daily.      . insulin glargine (LANTUS) 100 UNIT/ML injection Inject 65 Units into the skin daily.      Marland Kitchen lisinopril-hydrochlorothiazide (PRINZIDE,ZESTORETIC) 10-12.5 MG per tablet Take 1 tablet by mouth daily.      . methocarbamol (ROBAXIN) 750 MG tablet Take 750 mg by mouth every 8 (eight) hours as needed. For pain      . omeprazole (PRILOSEC) 20 MG capsule Take 20 mg by mouth daily.      . predniSONE (DELTASONE) 10 MG tablet Take 10 mg by mouth daily. Patient takes 6,5,4,3,2,1       Scheduled:   . antiseptic oral rinse  15 mL Mouth Rinse q12n4p  .  ceFAZolin (ANCEF) IV  1 g Intravenous Q8H  . chlorhexidine  15 mL Mouth Rinse BID  . famotidine (PEPCID) IV  20 mg Intravenous Q12H  . heparin  5,000 Units Subcutaneous Q8H  . pantoprazole (PROTONIX) IV  40 mg Intravenous Q24H    Continuous:   . sodium chloride 125 mL/hr at 11/08/11 0530    Assessment/Plan: Nausea vomiting diarrhea, resolved Abdominal distention with small and large bowel dilatation. X-ray suggests in significant dilatation of the cecum, await further input by GI to decide if decompressive colonoscopy required, continue NG tube however output appears to be minimal. Cellulitis Diabetes off meds as patient is n.p.o. And has NG tube. Hypertension Chronic kidney disease stage III  LOS: 2 days   Karlissa Aron D 11/08/2011, 9:08 AM

## 2011-11-08 NOTE — Progress Notes (Signed)
  Subjective: He want the tube out of his nose.  Belly is still very distended and tender.  Objective: Vital signs in last 24 hours: Temp:  [98.3 F (36.8 C)-98.9 F (37.2 C)] 98.9 F (37.2 C) (04/12 0512) Pulse Rate:  [62-68] 68  (04/12 0512) Resp:  [18] 18  (04/12 0512) BP: (169-189)/(51-74) 183/51 mmHg (04/12 0512) SpO2:  [98 %-99 %] 98 % (04/12 0512) Last BM Date: 11/06/11 Film for today still pending, afebrile , BP up some, labs show WBC is stable, and Creatinine is also improving Intake/Output from previous day: 04/11 0701 - 04/12 0700 In: 1250 [I.V.:1200; IV Piggyback:50] Out: 1800 [Urine:1500; Emesis/NG output:300] Intake/Output this shift:    General appearance: alert, cooperative and no distress, but he's clearly uncomfortable with abdomen so distended. GI: Marked abdominal distension, he does not have peritonitis, but is clearly tender when you palpate abd.  Lab Results:   [facility] 11/08/11 0343 11/07/11 0353  WBC 9.7 11.1*  HGB 12.7* 13.8  HCT 39.1 41.6  PLT 141* 147*    BMET  Basename 11/08/11 0343 11/07/11 0353  NA 137 134*  K 4.0 3.9  CL 111 107  CO2 19 18*  GLUCOSE 109* 280*  BUN 43* 61*  CREATININE 1.66* 2.34*  CALCIUM 7.9* 8.1*   PT/INR No results found for this basename: LABPROT:2,INR:2 in the last 72 hours   Lab 11/08/11 0343 11/06/11 0915  AST 60* 130*  ALT 53 61*  ALKPHOS 68 71  BILITOT 0.3 0.6  PROT 5.2* 6.2  ALBUMIN 2.3* 2.9*     Lipase     Component Value Date/Time   LIPASE 9* 11/06/2011 0915     Studies/Results: Dg Abd Portable 1v  11/07/2011  *RADIOLOGY REPORT*  Clinical Data: Abdominal distention and rigidity.  Diarrhea.  PORTABLE ABDOMEN - 1 VIEW  Comparison: None.  Findings: There is gaseous distention of small bowel and colon. Large amount of stool is seen in the cecum, which appears rather distended, measuring upwards of 13.6 cm.  IMPRESSION: Gaseous distention of small bowel and colon, with marked distention of the  cecum.  Ileus is considered.  Colonic obstruction cannot be definitively excluded.  Original Report Authenticated By: Reyes Ivan, M.D.    Medications:    . antiseptic oral rinse  15 mL Mouth Rinse q12n4p  .  ceFAZolin (ANCEF) IV  1 g Intravenous Q8H  . chlorhexidine  15 mL Mouth Rinse BID  . famotidine (PEPCID) IV  20 mg Intravenous Q12H  . heparin  5,000 Units Subcutaneous Q8H  . pantoprazole (PROTONIX) IV  40 mg Intravenous Q24H  . DISCONTD: simethicone  80 mg Oral QID    Assessment/Plan 1. Gastroenteritis  2. Severe abdominal distention with both large and small bowel dilatation; up to 13.6 cm.  3. Acute renal failure  4. And also diabetes mellitus  5. Diabetic retinopathy, macular degeneration.  6. Chronic back pain recently on steroids.  7. GERD  8. Hypertension  9. Probable sleep apnea  10. History of appendectomy    I expect the film to show more distension, and I think he will need some decompression.  I told him it's necessary to keep NG tube in.  LOS: 2 days    Leyanna Bittman 11/08/2011

## 2011-11-08 NOTE — Progress Notes (Signed)
The patient was seen this morning on rounds and he is doing about the same. His abdomen is still distended. A repeat abdominal x-ray did not reveal much change and there has not been any significant improvement in abdominal distention with NG suction overnight. Given all of this it was felt that colonoscopy with decompression was indicated. I discussed this with the patient and we will proceed with colonoscopy with attempted decompression.

## 2011-11-09 LAB — CBC
HCT: 37.2 % — ABNORMAL LOW (ref 39.0–52.0)
Hemoglobin: 12.2 g/dL — ABNORMAL LOW (ref 13.0–17.0)
MCH: 31.5 pg (ref 26.0–34.0)
MCHC: 32.8 g/dL (ref 30.0–36.0)
RDW: 14.4 % (ref 11.5–15.5)

## 2011-11-09 LAB — MAGNESIUM: Magnesium: 2.1 mg/dL (ref 1.5–2.5)

## 2011-11-09 LAB — GLUCOSE, CAPILLARY: Glucose-Capillary: 94 mg/dL (ref 70–99)

## 2011-11-09 LAB — DIFFERENTIAL
Basophils Absolute: 0 10*3/uL (ref 0.0–0.1)
Basophils Relative: 0 % (ref 0–1)
Eosinophils Absolute: 0.2 10*3/uL (ref 0.0–0.7)
Monocytes Absolute: 0.7 10*3/uL (ref 0.1–1.0)
Monocytes Relative: 10 % (ref 3–12)

## 2011-11-09 LAB — COMPREHENSIVE METABOLIC PANEL
AST: 42 U/L — ABNORMAL HIGH (ref 0–37)
Albumin: 2.3 g/dL — ABNORMAL LOW (ref 3.5–5.2)
Alkaline Phosphatase: 67 U/L (ref 39–117)
BUN: 32 mg/dL — ABNORMAL HIGH (ref 6–23)
CO2: 20 mEq/L (ref 19–32)
Chloride: 112 mEq/L (ref 96–112)
Creatinine, Ser: 1.42 mg/dL — ABNORMAL HIGH (ref 0.50–1.35)
GFR calc non Af Amer: 44 mL/min — ABNORMAL LOW (ref >90)
Potassium: 4.1 mEq/L (ref 3.5–5.1)
Total Bilirubin: 0.4 mg/dL (ref 0.3–1.2)

## 2011-11-09 LAB — CHOLESTEROL, TOTAL: Cholesterol: 95 mg/dL (ref 0–200)

## 2011-11-09 NOTE — Progress Notes (Signed)
Subjective: Feels about the same. Passing a bit of flatus. NG tube is really bothering him. No dyspnea. Getting narcotics about 1-2 times daily.   Back is hurting some. This is not new.   Objective:  Vital Signs: Filed Vitals:   26-Nov-2011 1242 Nov 26, 2011 2010 2011-11-26 2011 11/09/11 0421  BP: 161/54  161/45 152/43  Pulse: 59 60 56 56  Temp: 97.2 F (36.2 C)  97.4 F (36.3 C) 98.1 F (36.7 C)  TempSrc: Oral  Oral Oral  Resp: 16  18 18   Height:      Weight:      SpO2: 100%  100% 98%     EXAM: Distended. Mild diffuse tenderness.    Intake/Output Summary (Last 24 hours) at 11/09/11 0832 Last data filed at 11/09/11 0528  Gross per 24 hour  Intake 3436.92 ml  Output   2375 ml  Net 1061.92 ml    Lab Results:  [facility] 11/09/11 0519 2011-11-26 0343  NA 139 137  K 4.1 4.0  CL 112 111  CO2 20 19  GLUCOSE 92 109*  BUN 32* 43*  CREATININE 1.42* 1.66*  CALCIUM 8.1* 7.9*  MG 2.1 2.1  PHOS 2.1* --    Basename 11/09/11 0519 11/26/11 0343  AST 42* 60*  ALT 36 53  ALKPHOS 67 68  BILITOT 0.4 0.3  PROT 5.1* 5.2*  ALBUMIN 2.3* 2.3*    Basename 11/06/11 0915  LIPASE 9*  AMYLASE --    Basename 11/09/11 0519 11-26-11 0343 11/06/11 0915  WBC 7.0 9.7 --  NEUTROABS 5.0 -- 20.7*  HGB 12.2* 12.7* --  HCT 37.2* 39.1 --  MCV 96.1 95.1 --  PLT 149* 141* --    Studies/Results: Dg Abd 2 Views  11/26/11  *RADIOLOGY REPORT*  Clinical Data: Abdominal pain and distension, evaluate for small bowel obstruction  ABDOMEN - 2 VIEW  Comparison: 11/07/2011;  Findings: No significant change in marked gaseous distension of predominately the cecum, measuring approximately 13 cm in diameter. This finding is associated with mild to moderate gaseous distension of multiple loops of large and small bowel.  No definite pneumoperitoneum, pneumatosis or portal venous gas.  An enteric tube terminates over the left upper abdominal quadrant.  No acute osseous abnormalities.  Redemonstrated multilevel  flowing osteophytosis with preservation of disc spaces. Mild degenerative change of the bilateral hips.  IMPRESSION: Grossly unchanged finding suggestive of ileus.  Original Report Authenticated By: Waynard Reeds, M.D.   Medications: Scheduled Meds:   . antiseptic oral rinse  15 mL Mouth Rinse q12n4p  .  ceFAZolin (ANCEF) IV  1 g Intravenous Q8H  . chlorhexidine  15 mL Mouth Rinse BID  . heparin  5,000 Units Subcutaneous Q8H  . insulin aspart  0-9 Units Subcutaneous Q4H  . pantoprazole (PROTONIX) IV  40 mg Intravenous Q1200  . polyethylene glycol  17 g Oral Daily  . sodium chloride  10-40 mL Intracatheter Q12H  . DISCONTD: famotidine (PEPCID) IV  20 mg Intravenous Q12H  . DISCONTD: pantoprazole (PROTONIX) IV  40 mg Intravenous Q24H   Continuous Infusions:   . sodium chloride 1,000 mL (11/09/11 0528)  . sodium chloride    . TPN (CLINIMIX) +/- additives     And  . fat emulsion    . DISCONTD: ADULT TPN     PRN Meds:.acetaminophen, acetaminophen, HYDROmorphone, ondansetron (ZOFRAN) IV, ondansetron, sodium chloride, DISCONTD: fentaNYL, DISCONTD: midazolam  Assessment/Plan: Principal Problem:  *Ileus - continue NG suction. I told him that there was really  nothing we could do about the NG tube. Due to ileus starting TNA tonight. I am sure we will have some issues with his sugars.  Active Problems:  Diabetic retinopathy  Macular degeneration  Diabetic neuropathy  HTN (hypertension) - adequately controlled.   GERD (gastroesophageal reflux disease)  Back pain  Renal failure  DM (diabetes mellitus)  Previous back surgery  S/P appendectomy  Cellulitis of right leg - stable.    LOS: 3 days   Ronald Lindsey 11/09/2011, 8:32 AM

## 2011-11-09 NOTE — Progress Notes (Signed)
Gastroenteritis  Subjective: Feels about the same, maybe a bit better, definitely not worse. Passed a little gas, but no BM.  Objective: Vital signs in last 24 hours: Temp:  [97.2 F (36.2 C)-98.4 F (36.9 C)] 98.1 F (36.7 C) (04/13 0421) Pulse Rate:  [56-63] 56  (04/13 0421) Resp:  [9-50] 18  (04/13 0421) BP: (131-181)/(43-82) 152/43 mmHg (04/13 0421) SpO2:  [84 %-100 %] 98 % (04/13 0421) Last BM Date: 11/06/11  Intake/Output from previous day: 04/12 0701 - 04/13 0700 In: 3436.9 [I.V.:3286.9; IV Piggyback:150] Out: 2375 [Urine:1975; Emesis/NG output:400] Intake/Output this shift:    General appearance: alert, cooperative and mild distress GI: Remains distended, no real change. Still slightly tender RLQ. Starting to get some bruising of abd from heparin, this may make exam less reliable.  Lab Results:  Results for orders placed during the hospital encounter of 11/06/11 (from the past 24 hour(s))  GLUCOSE, CAPILLARY     Status: Normal   Collection Time   11/08/11 12:36 PM      Component Value Range   Glucose-Capillary 97  70 - 99 (mg/dL)  GLUCOSE, CAPILLARY     Status: Normal   Collection Time   11/08/11  5:17 PM      Component Value Range   Glucose-Capillary 81  70 - 99 (mg/dL)  GLUCOSE, CAPILLARY     Status: Normal   Collection Time   11/08/11  8:08 PM      Component Value Range   Glucose-Capillary 86  70 - 99 (mg/dL)  GLUCOSE, CAPILLARY     Status: Normal   Collection Time   11/09/11 12:29 AM      Component Value Range   Glucose-Capillary 94  70 - 99 (mg/dL)   Comment 1 Notify RN    GLUCOSE, CAPILLARY     Status: Normal   Collection Time   11/09/11  4:19 AM      Component Value Range   Glucose-Capillary 91  70 - 99 (mg/dL)  COMPREHENSIVE METABOLIC PANEL     Status: Abnormal   Collection Time   11/09/11  5:19 AM      Component Value Range   Sodium 139  135 - 145 (mEq/L)   Potassium 4.1  3.5 - 5.1 (mEq/L)   Chloride 112  96 - 112 (mEq/L)   CO2 20  19 - 32  (mEq/L)   Glucose, Bld 92  70 - 99 (mg/dL)   BUN 32 (*) 6 - 23 (mg/dL)   Creatinine, Ser 9.62 (*) 0.50 - 1.35 (mg/dL)   Calcium 8.1 (*) 8.4 - 10.5 (mg/dL)   Total Protein 5.1 (*) 6.0 - 8.3 (g/dL)   Albumin 2.3 (*) 3.5 - 5.2 (g/dL)   AST 42 (*) 0 - 37 (U/L)   ALT 36  0 - 53 (U/L)   Alkaline Phosphatase 67  39 - 117 (U/L)   Total Bilirubin 0.4  0.3 - 1.2 (mg/dL)   GFR calc non Af Amer 44 (*) >90 (mL/min)   GFR calc Af Amer 51 (*) >90 (mL/min)  MAGNESIUM     Status: Normal   Collection Time   11/09/11  5:19 AM      Component Value Range   Magnesium 2.1  1.5 - 2.5 (mg/dL)  PHOSPHORUS     Status: Abnormal   Collection Time   11/09/11  5:19 AM      Component Value Range   Phosphorus 2.1 (*) 2.3 - 4.6 (mg/dL)  CBC     Status: Abnormal  Collection Time   11/09/11  5:19 AM      Component Value Range   WBC 7.0  4.0 - 10.5 (K/uL)   RBC 3.87 (*) 4.22 - 5.81 (MIL/uL)   Hemoglobin 12.2 (*) 13.0 - 17.0 (g/dL)   HCT 30.8 (*) 65.7 - 52.0 (%)   MCV 96.1  78.0 - 100.0 (fL)   MCH 31.5  26.0 - 34.0 (pg)   MCHC 32.8  30.0 - 36.0 (g/dL)   RDW 84.6  96.2 - 95.2 (%)   Platelets 149 (*) 150 - 400 (K/uL)  DIFFERENTIAL     Status: Normal   Collection Time   11/09/11  5:19 AM      Component Value Range   Neutrophils Relative 72  43 - 77 (%)   Neutro Abs 5.0  1.7 - 7.7 (K/uL)   Lymphocytes Relative 16  12 - 46 (%)   Lymphs Abs 1.1  0.7 - 4.0 (K/uL)   Monocytes Relative 10  3 - 12 (%)   Monocytes Absolute 0.7  0.1 - 1.0 (K/uL)   Eosinophils Relative 2  0 - 5 (%)   Eosinophils Absolute 0.2  0.0 - 0.7 (K/uL)   Basophils Relative 0  0 - 1 (%)   Basophils Absolute 0.0  0.0 - 0.1 (K/uL)  GLUCOSE, CAPILLARY     Status: Normal   Collection Time   11/09/11  7:28 AM      Component Value Range   Glucose-Capillary 90  70 - 99 (mg/dL)   Comment 1 Documented in Chart     Comment 2 Notify RN       Studies/Results Radiology     MEDS, Scheduled    . antiseptic oral rinse  15 mL Mouth Rinse q12n4p    .  ceFAZolin (ANCEF) IV  1 g Intravenous Q8H  . chlorhexidine  15 mL Mouth Rinse BID  . heparin  5,000 Units Subcutaneous Q8H  . insulin aspart  0-9 Units Subcutaneous Q4H  . pantoprazole (PROTONIX) IV  40 mg Intravenous Q1200  . polyethylene glycol  17 g Oral Daily  . sodium chloride  10-40 mL Intracatheter Q12H  . DISCONTD: famotidine (PEPCID) IV  20 mg Intravenous Q12H  . DISCONTD: pantoprazole (PROTONIX) IV  40 mg Intravenous Q24H     Assessment: Gastroenteritis Ileus, although distal colon obstruction not completely ruled out, maybe improving  Plan: Will follow. Will see if heparin can be given in legs instead of abdomen  LOS: 3 days    Currie Paris, MD, [facility] Surgery, [place] [phone]   11/09/2011 8:21 AM

## 2011-11-09 NOTE — Progress Notes (Signed)
Patient ID: Ronald Lindsey, male   DOB: 11-07-1924, 78 y.o.   MRN: [id number] [facility] Gastroenterology Progress Note  Ronald Lindsey 78 y.o. [birth year]   Subjective: Seen earlier this morning and reported continued abdominal distention. NG tube still in place.  Objective: Vital signs: Filed Vitals:   11/09/11 0421  BP: 152/43  Pulse: 56  Temp: 98.1 F (36.7 C)  Resp: 18    Intake/Output last 24 hrs:  Intake/Output Summary (Last 24 hours) at 11/09/11 1545 Last data filed at 11/09/11 0900  Gross per 24 hour  Intake 2136.92 ml  Output   2475 ml  Net -338.08 ml    Physical Exam: Gen: alert, no acute distress  Abd: distended with diffuse tenderness  Lab Results:  [facility] 11/09/11 0519 11/08/11 0343  NA 139 137  K 4.1 4.0  CL 112 111  CO2 20 19  GLUCOSE 92 109*  BUN 32* 43*  CREATININE 1.42* 1.66*  CALCIUM 8.1* 7.9*  MG 2.1 2.1  PHOS 2.1* --    Basename 11/09/11 0519 11/08/11 0343  AST 42* 60*  ALT 36 53  ALKPHOS 67 68  BILITOT 0.4 0.3  PROT 5.1* 5.2*  ALBUMIN 2.3* 2.3*    Basename 11/09/11 0519 11/08/11 0343  WBC 7.0 9.7  NEUTROABS 5.0 --  HGB 12.2* 12.7*  HCT 37.2* 39.1  MCV 96.1 95.1  PLT 149* 141*       Assessment/Plan: 78yo with ileus s/p colonoscopy and attempted decompression that failed due to solid stool being present. Continue NG suction due to ileus. Supportive care. Bowel rest. Will follow.   Eshaal Duby C. 11/09/2011, 3:45 PM

## 2011-11-10 ENCOUNTER — Inpatient Hospital Stay (HOSPITAL_COMMUNITY): Payer: Medicare Other

## 2011-11-10 LAB — GLUCOSE, CAPILLARY
Glucose-Capillary: 161 mg/dL — ABNORMAL HIGH (ref 70–99)
Glucose-Capillary: 182 mg/dL — ABNORMAL HIGH (ref 70–99)

## 2011-11-10 MED ORDER — INSULIN ASPART 100 UNIT/ML ~~LOC~~ SOLN
0.0000 [IU] | SUBCUTANEOUS | Status: DC
  Administered 2011-11-10 (×4): 3 [IU] via SUBCUTANEOUS
  Administered 2011-11-11: 5 [IU] via SUBCUTANEOUS
  Administered 2011-11-11: 3 [IU] via SUBCUTANEOUS
  Administered 2011-11-11 (×3): 5 [IU] via SUBCUTANEOUS
  Administered 2011-11-11 – 2011-11-12 (×2): 3 [IU] via SUBCUTANEOUS
  Administered 2011-11-12 (×2): 5 [IU] via SUBCUTANEOUS
  Administered 2011-11-12: 3 [IU] via SUBCUTANEOUS
  Administered 2011-11-12: 5 [IU] via SUBCUTANEOUS
  Administered 2011-11-12: 3 [IU] via SUBCUTANEOUS
  Administered 2011-11-13: 2 [IU] via SUBCUTANEOUS

## 2011-11-10 MED ORDER — INSULIN GLARGINE 100 UNIT/ML ~~LOC~~ SOLN
20.0000 [IU] | Freq: Every day | SUBCUTANEOUS | Status: DC
  Administered 2011-11-10 – 2011-11-11 (×2): 20 [IU] via SUBCUTANEOUS

## 2011-11-10 MED ORDER — FAT EMULSION 20 % IV EMUL
240.0000 mL | INTRAVENOUS | Status: DC
  Filled 2011-11-10: qty 250

## 2011-11-10 MED ORDER — CLINIMIX E/DEXTROSE (5/20) 5 % IV SOLN
INTRAVENOUS | Status: DC
  Filled 2011-11-10: qty 1000

## 2011-11-10 MED ORDER — CLINIMIX E/DEXTROSE (5/20) 5 % IV SOLN
INTRAVENOUS | Status: AC
  Administered 2011-11-10: 18:00:00 via INTRAVENOUS
  Filled 2011-11-10: qty 2000

## 2011-11-10 MED ORDER — FAT EMULSION 20 % IV EMUL
240.0000 mL | INTRAVENOUS | Status: AC
  Administered 2011-11-10: 240 mL via INTRAVENOUS
  Filled 2011-11-10: qty 250

## 2011-11-10 MED ORDER — SODIUM CHLORIDE 0.9 % IV SOLN
INTRAVENOUS | Status: DC
  Administered 2011-11-13: 07:00:00 via INTRAVENOUS

## 2011-11-10 NOTE — Progress Notes (Signed)
Gastroenteritis  Subjective: Feels better, slightly today, passed some gas, no BM. Wants ng out as it is bothering him  Objective: Vital signs in last 24 hours: Temp:  [98.2 F (36.8 C)-98.8 F (37.1 C)] 98.8 F (37.1 C) (04/14 0405) Pulse Rate:  [50-55] 50  (04/14 0405) Resp:  [18-19] 19  (04/14 0405) BP: (151-167)/(62-65) 167/65 mmHg (04/14 0405) SpO2:  [95 %-98 %] 95 % (04/14 0405) Last BM Date: 11/06/11  Intake/Output from previous day: 04/13 0701 - 04/14 0700 In: 0  Out: 2100 [Urine:2000; Emesis/NG output:100] Intake/Output this shift:    General appearance: alert, cooperative and no distress GI: Still distended, but soft an not tender. BS+. I think his exam is improved slightly  Lab Results:  Results for orders placed during the hospital encounter of 11/06/11 (from the past 24 hour(s))  GLUCOSE, CAPILLARY     Status: Normal   Collection Time   11/09/11 12:00 PM      Component Value Range   Glucose-Capillary 94  70 - 99 (mg/dL)   Comment 1 Documented in Chart     Comment 2 Notify RN    GLUCOSE, CAPILLARY     Status: Normal   Collection Time   11/09/11  4:28 PM      Component Value Range   Glucose-Capillary 91  70 - 99 (mg/dL)   Comment 1 Documented in Chart     Comment 2 Notify RN    GLUCOSE, CAPILLARY     Status: Abnormal   Collection Time   11/09/11  8:02 PM      Component Value Range   Glucose-Capillary 118 (*) 70 - 99 (mg/dL)   Comment 1 Notify RN    GLUCOSE, CAPILLARY     Status: Abnormal   Collection Time   11/10/11 12:24 AM      Component Value Range   Glucose-Capillary 161 (*) 70 - 99 (mg/dL)   Comment 1 Notify RN    GLUCOSE, CAPILLARY     Status: Abnormal   Collection Time   11/10/11  4:05 AM      Component Value Range   Glucose-Capillary 161 (*) 70 - 99 (mg/dL)   Comment 1 Notify RN    GLUCOSE, CAPILLARY     Status: Abnormal   Collection Time   11/10/11  7:28 AM      Component Value Range   Glucose-Capillary 185 (*) 70 - 99 (mg/dL)   Comment  1 Documented in Chart     Comment 2 Notify RN       Studies/Results Radiology     MEDS, Scheduled    . antiseptic oral rinse  15 mL Mouth Rinse q12n4p  .  ceFAZolin (ANCEF) IV  1 g Intravenous Q8H  . chlorhexidine  15 mL Mouth Rinse BID  . heparin  5,000 Units Subcutaneous Q8H  . insulin aspart  0-15 Units Subcutaneous Q4H  . insulin glargine  20 Units Subcutaneous Daily  . pantoprazole (PROTONIX) IV  40 mg Intravenous Q1200  . polyethylene glycol  17 g Oral Daily  . sodium chloride  10-40 mL Intracatheter Q12H  . DISCONTD: insulin aspart  0-9 Units Subcutaneous Q4H     Assessment: Gastroenteritis Colon ileus, possible improved  Plan: Will recheck xray today. Maybe could get ng out  LOS: 4 days    Currie Paris, MD, [facility] Surgery, [place] [phone]   11/10/2011 8:38 AM

## 2011-11-10 NOTE — Progress Notes (Addendum)
PARENTERAL NUTRITION CONSULT NOTE - Follow up  Pharmacy Consult for TNA Indication: Prolonged NPO 2/2 colonic distention  No Known Allergies  Patient Measurements: Height: 5\' 9"  (175.3 cm) Weight: 256 lb (116.121 kg) IBW/kg (Calculated) : 70.7  Adjusted Body Weight: 88.8 kg  Vital Signs: Temp: 98.8 F (37.1 C) (04/14 0405) Temp src: Oral (04/14 0405) BP: 167/65 mmHg (04/14 0405) Pulse Rate: 50  (04/14 0405) Intake/Output from previous day: 04/13 0701 - 04/14 0700 In: 0  Out: 2100 [Urine:2000; Emesis/NG output:100] Intake/Output from this shift:    Labs:  [facility] 11/09/11 0519 11/08/11 0343  WBC 7.0 9.7  HGB 12.2* 12.7*  HCT 37.2* 39.1  PLT 149* 141*  APTT -- --  INR -- --     Basename 11/09/11 0519 11/08/11 0343  NA 139 137  K 4.1 4.0  CL 112 111  CO2 20 19  GLUCOSE 92 109*  BUN 32* 43*  CREATININE 1.42* 1.66*  LABCREA -- --  CREAT24HRUR -- --  CALCIUM 8.1* 7.9*  MG 2.1 2.1  PHOS 2.1* --  PROT 5.1* 5.2*  ALBUMIN 2.3* 2.3*  AST 42* 60*  ALT 36 53  ALKPHOS 67 68  BILITOT 0.4 0.3  BILIDIR -- --  IBILI -- --  PREALBUMIN 10.0* --  TRIG 143 --  CHOLHDL -- --  CHOL 95 --   Estimated Creatinine Clearance: 48.7 ml/min (by C-G formula based on Cr of 1.42).    Basename 11/10/11 0024 11/09/11 2002 11/09/11 1628  GLUCAP 161* 118* 91    Medical History: Past Medical History  Diagnosis Date  . Diabetes mellitus   . Diabetic retinopathy   . Macular degeneration   . Diabetic neuropathy   . HTN (hypertension)   . GERD (gastroesophageal reflux disease)   . Back pain   . Previous back surgery 11/06/2011  . S/P appendectomy 11/06/2011    Insulin Requirements in the past 24 hours:  4 units  Current Nutrition:  NPO  Assessment:  86yom with abdominal distention with small and large bowel dilatation s/p colonoscopy with attempt at decompression today.  Continuing NG suction and NPO.  Began TNA 4/13 due to ileus.  NG output recorded at only 100 ml  the last 24hrs  SCr improving based on 4/13 labs  CBGs appear to be steadily rising with addition of TNA  Other labs WNL except phos slightly low yesterday AM.  Nutritional Goals:  2398 kCal, 106 grams of protein per day  Plan:   Tolerating Clinimix E 5/20 at 40 ml/hr - will increase rate toward goal at stated below  Fat emulsion at 10 ml/hr.  Plan to advance as tolerated to a goal rate of 88 ml/hr + 10 ml/hr to provide: 106 g/day protein and 2338 Kcal/day.  TNA to contain standard multivitamins and trace elements(MWF only due to ongoing shortage).  Reduce IVF to 60 ml/hr.  Change SSI q4h to moderate scale.  Patient has history of DM, so may need to consider changing to E 5/15 formula as rate of TNA is increased. Note that patient was on Lantus 65 units daily PTA that has not been restarted. Will restart Lantus at a lower dose today  TNA lab panels on Mondays & Thursdays.  F/u daily.   Hessie Knows, PharmD, BCPS Pager [phone] 11/10/2011 7:35 AM

## 2011-11-10 NOTE — Progress Notes (Signed)
Patient ID: Ronald Lindsey, male   DOB: 12-07-1924, 78 y.o.   MRN: [id number] [facility] Gastroenterology Progress Note  Ronald Lindsey 78 y.o. [birth year]   Subjective: Continued distention. Reports small amount of flatus. No BMs. Xray shows NG tube tip in proximal stomach. Minimal to no NG tube output.  Objective: Vital signs: Filed Vitals:   11/10/11 0405  BP: 167/65  Pulse: 50  Temp: 98.8 F (37.1 C)  Resp: 19    Intake/Output last 24 hrs:  Intake/Output Summary (Last 24 hours) at 11/10/11 1214 Last data filed at 11/10/11 0405  Gross per 24 hour  Intake      0 ml  Output   1900 ml  Net  -1900 ml    Physical Exam: Gen: alert, no acute distress  Abd: less distended, softer, NT  Lab Results:  [facility] 11/09/11 0519 11/08/11 0343  NA 139 137  K 4.1 4.0  CL 112 111  CO2 20 19  GLUCOSE 92 109*  BUN 32* 43*  CREATININE 1.42* 1.66*  CALCIUM 8.1* 7.9*  MG 2.1 2.1  PHOS 2.1* --    Basename 11/09/11 0519 11/08/11 0343  AST 42* 60*  ALT 36 53  ALKPHOS 67 68  BILITOT 0.4 0.3  PROT 5.1* 5.2*  ALBUMIN 2.3* 2.3*    Basename 11/09/11 0519 11/08/11 0343  WBC 7.0 9.7  NEUTROABS 5.0 --  HGB 12.2* 12.7*  HCT 37.2* 39.1  MCV 96.1 95.1  PLT 149* 141*      Assessment/Plan: Ileus - slow to resolve. NG tube in proximal stomach. Dr. Si Gaul reports distention markedly improved on today's study c/w 11/08/11. Will d/c NG tube and repeat Xray in AM. Supportive care. Bowel rest. Will follow.   [name] 11/10/2011, 12:14 PM

## 2011-11-10 NOTE — Progress Notes (Signed)
Pt's NGT removed without difficulty, pt tolerated well. Will continue to monitor.

## 2011-11-10 NOTE — Progress Notes (Signed)
Pt up to [facility] with out difficulty with 2 person assist, pt passed large amount of flatus, no BM.

## 2011-11-10 NOTE — Progress Notes (Signed)
Subjective: He notes that the NG is not draining anything and he would like it out. Some flatus. No BM.   Objective:  Vital Signs: Filed Vitals:   11-28-11 2011 11/09/11 0421 11/09/11 2000 11/10/11 0405  BP: 161/45 152/43 151/62 167/65  Pulse: 56 56 55 50  Temp: 97.4 F (36.3 C) 98.1 F (36.7 C) 98.2 F (36.8 C) 98.8 F (37.1 C)  TempSrc: Oral Oral Oral Oral  Resp: 18 18 18 19   Height:      Weight:      SpO2: 100% 98% 98% 95%     EXAM: Abd distended. Minimal tenderness.   EXT: right leg: mild edema; no erythema or heat   Intake/Output Summary (Last 24 hours) at 11/10/11 0859 Last data filed at 11/10/11 0405  Gross per 24 hour  Intake      0 ml  Output   2100 ml  Net  -2100 ml    Lab Results:  [facility] 11/09/11 0519 Nov 28, 2011 0343  NA 139 137  K 4.1 4.0  CL 112 111  CO2 20 19  GLUCOSE 92 109*  BUN 32* 43*  CREATININE 1.42* 1.66*  CALCIUM 8.1* 7.9*  MG 2.1 2.1  PHOS 2.1* --    Basename 11/09/11 0519 Nov 28, 2011 0343  AST 42* 60*  ALT 36 53  ALKPHOS 67 68  BILITOT 0.4 0.3  PROT 5.1* 5.2*  ALBUMIN 2.3* 2.3*   No results found for this basename: LIPASE:2,AMYLASE:2 in the last 72 hours  Basename 11/09/11 0519 Nov 28, 2011 0343  WBC 7.0 9.7  NEUTROABS 5.0 --  HGB 12.2* 12.7*  HCT 37.2* 39.1  MCV 96.1 95.1  PLT 149* 141*   No results found for this basename: CKTOTAL:3,CKMB:3,CKMBINDEX:3,TROPONINI:3 in the last 72 hours No components found with this basename: POCBNP:3 No results found for this basename: DDIMER:2 in the last 72 hours No results found for this basename: HGBA1C:2 in the last 72 hours  Basename 11/09/11 0519  CHOL 95  HDL --  LDLCALC --  TRIG 143  CHOLHDL --  LDLDIRECT --   No results found for this basename: TSH,T4TOTAL,FREET3,T3FREE,THYROIDAB in the last 72 hours No results found for this basename: VITAMINB12:2,FOLATE:2,FERRITIN:2,TIBC:2,IRON:2,RETICCTPCT:2 in the last 72 hours  Studies/Results: Dg Abd 2 Views  11-28-2011  *RADIOLOGY  REPORT*  Clinical Data: Abdominal pain and distension, evaluate for small bowel obstruction  ABDOMEN - 2 VIEW  Comparison: 11/07/2011;  Findings: No significant change in marked gaseous distension of predominately the cecum, measuring approximately 13 cm in diameter. This finding is associated with mild to moderate gaseous distension of multiple loops of large and small bowel.  No definite pneumoperitoneum, pneumatosis or portal venous gas.  An enteric tube terminates over the left upper abdominal quadrant.  No acute osseous abnormalities.  Redemonstrated multilevel flowing osteophytosis with preservation of disc spaces. Mild degenerative change of the bilateral hips.  IMPRESSION: Grossly unchanged finding suggestive of ileus.  Original Report Authenticated By: Waynard Reeds, M.D.   Medications: Scheduled Meds:   . antiseptic oral rinse  15 mL Mouth Rinse q12n4p  .  ceFAZolin (ANCEF) IV  1 g Intravenous Q8H  . chlorhexidine  15 mL Mouth Rinse BID  . heparin  5,000 Units Subcutaneous Q8H  . insulin aspart  0-15 Units Subcutaneous Q4H  . insulin glargine  20 Units Subcutaneous Daily  . pantoprazole (PROTONIX) IV  40 mg Intravenous Q1200  . polyethylene glycol  17 g Oral Daily  . sodium chloride  10-40 mL Intracatheter Q12H  .  DISCONTD: insulin aspart  0-9 Units Subcutaneous Q4H   Continuous Infusions:   . sodium chloride 1,000 mL (11/09/11 0528)  . sodium chloride 85 mL/hr at 11/09/11 2200  . sodium chloride    . TPN (CLINIMIX) +/- additives 40 mL/hr at 11/09/11 1844   And  . fat emulsion 240 mL (11/09/11 1845)  . TPN (CLINIMIX) +/- additives     And  . fat emulsion    . DISCONTD: fat emulsion    . DISCONTD: TPN (CLINIMIX) +/- additives     PRN Meds:.acetaminophen, acetaminophen, HYDROmorphone, ondansetron (ZOFRAN) IV, ondansetron, sodium chloride  Assessment/Plan: Principal Problem:  *Gastroenteritis - NG output is low now. That could be due to (1) NG tube not being needed or (2)  NG tube in wrong place. Xray pending to check this.  Active Problems:  Diabetic retinopathy  Macular degeneration  Diabetic neuropathy  HTN (hypertension) - BP controlled  GERD (gastroesophageal reflux disease)  Back pain  Renal failure - creatinine improving.  DM (diabetes mellitus) - good control of sugars during TNA.   Previous back surgery  S/P appendectomy  Cellulitis of right leg - improved.    LOS: 4 days   Ronald Lindsey 11/10/2011, 8:59 AM

## 2011-11-10 NOTE — Progress Notes (Signed)
INITIAL ADULT NUTRITION ASSESSMENT Date: 11/10/2011   Time: 11:38 AM  Reason for Assessment: New TPN  ASSESSMENT: Male 78 y.o.  Dx: Gastroenteritis  Hx:  Past Medical History  Diagnosis Date  . Diabetes mellitus   . Diabetic retinopathy   . Macular degeneration   . Diabetic neuropathy   . HTN (hypertension)   . GERD (gastroesophageal reflux disease)   . Back pain   . Previous back surgery 11/06/2011  . S/P appendectomy 11/06/2011   Related Meds:    . insulin aspart  0-15 Units Subcutaneous Q4H  . insulin glargine  20 Units Subcutaneous Daily  . pantoprazole (PROTONIX) IV  40 mg Intravenous Q1200    Ht: 5\' 9"  (175.3 cm)  Wt: 256 lb (116.121 kg)  Ideal Wt: 72.7 kg % Ideal Wt: 160%  Usual Wt: 115 kg % Usual Wt: 101%  Body mass index is 37.80 kg/(m^2)., obesity unspecified  Food/Nutrition Related Hx: Patient reports he has always had a large appetite, but has not eaten since last Monday due to gastroenteritis. He currently has NG tube in place with 400 ml output yesterday. Colonoscopy revealed large amount of stool in colon. TPN started last night. Clinimix 5/20 at 40 ml/hr + lipids at 10 ml/hr. This will be advanced to 65 ml/hr tonight, with a goal rate of 88 ml/hr, which will provide 2338 kcal and 106 g protein. Patient is tolerating well.   Patient and wife acknowledge need to change diet habits after returning home. They have seen a dietitian at physician's office several months ago. However, patient reports he "is 78 years old and doesn't want to starve himself." He eats a lot of salads and vegetables. Fasting blood glucose at home 70-100 mg/dL.   Labs:  CMP     Component Value Date/Time   NA 139 11/09/2011 0519   K 4.1 11/09/2011 0519   CL 112 11/09/2011 0519   CO2 20 11/09/2011 0519   GLUCOSE 92 11/09/2011 0519   BUN 32* 11/09/2011 0519   CREATININE 1.42* 11/09/2011 0519   CALCIUM 8.1* 11/09/2011 0519   PROT 5.1* 11/09/2011 0519   ALBUMIN 2.3* 11/09/2011 0519   AST  42* 11/09/2011 0519   ALT 36 11/09/2011 0519   ALKPHOS 67 11/09/2011 0519   BILITOT 0.4 11/09/2011 0519   GFRNONAA 44* 11/09/2011 0519   GFRAA 51* 11/09/2011 0519   PREALBUMIN 10 L PHOSPHORUS 2.1 L MAGNESIUM 2.1   Intake/Output Summary (Last 24 hours) at 11/10/11 1145 Last data filed at 11/10/11 0405  Gross per 24 hour  Intake      0 ml  Output   1900 ml  Net  -1900 ml     Diet Order: NPO  Supplements/Tube Feeding: None  IVF:    sodium chloride Last Rate: 1,000 mL (11/09/11 0528)  sodium chloride Last Rate: 85 mL/hr at 11/09/11 2200  sodium chloride   TPN (CLINIMIX) +/- additives Last Rate: 40 mL/hr at 11/09/11 1844  fat emulsion Last Rate: 240 mL (11/09/11 1845)  TPN (CLINIMIX) +/- additives   fat emulsion     Estimated Nutritional Needs:   Kcal:2050-2225 kcal Protein:93-116 g Fluid:2.5 L  NUTRITION DIAGNOSIS: -Inadequate oral intake (NI-2.1).  Status: Ongoing  RELATED TO: gastroenteritis  AS EVIDENCE BY: NPO status and TPN  MONITORING/EVALUATION(Goals): Patient will meet 90-100% of estimated nutrition needs with TPN.   Monitor: TPN advance and tolerance, weight status, labs, education needs  EDUCATION NEEDS: -Education not appropriate at this time  INTERVENTION: 1. TPN at goal  rate would provide 105% of upper end of caloric needs, consider decreasing goal rate to Clinimix 5/20 @ 82 ml/hr with lipids at 10 ml/hr which will provide 2212 kcal, 98 g protein.   2. Briefly discussed healthy eating with patient and wife including controlling carb portions and continuing to eat vegetables. When appropriate, RD will provide more detailed education if desired by patient and wife.   DOCUMENTATION CODES Per approved criteria  -Obesity Unspecified    Fabio Pierce 11/10/2011, 11:38 AM

## 2011-11-11 ENCOUNTER — Inpatient Hospital Stay (HOSPITAL_COMMUNITY): Payer: Medicare Other

## 2011-11-11 ENCOUNTER — Encounter (HOSPITAL_COMMUNITY): Payer: Self-pay | Admitting: Gastroenterology

## 2011-11-11 LAB — DIFFERENTIAL
Basophils Absolute: 0 10*3/uL (ref 0.0–0.1)
Basophils Relative: 0 % (ref 0–1)
Eosinophils Absolute: 0.2 10*3/uL (ref 0.0–0.7)
Lymphs Abs: 1.2 10*3/uL (ref 0.7–4.0)
Neutrophils Relative %: 69 % (ref 43–77)

## 2011-11-11 LAB — GLUCOSE, CAPILLARY
Glucose-Capillary: 206 mg/dL — ABNORMAL HIGH (ref 70–99)
Glucose-Capillary: 246 mg/dL — ABNORMAL HIGH (ref 70–99)

## 2011-11-11 LAB — COMPREHENSIVE METABOLIC PANEL
Alkaline Phosphatase: 74 U/L (ref 39–117)
BUN: 23 mg/dL (ref 6–23)
GFR calc Af Amer: 71 mL/min — ABNORMAL LOW (ref >90)
GFR calc non Af Amer: 61 mL/min — ABNORMAL LOW (ref >90)
Glucose, Bld: 218 mg/dL — ABNORMAL HIGH (ref 70–99)
Potassium: 4 mEq/L (ref 3.5–5.1)
Total Protein: 5 g/dL — ABNORMAL LOW (ref 6.0–8.3)

## 2011-11-11 LAB — CBC
MCH: 31.1 pg (ref 26.0–34.0)
MCHC: 32.2 g/dL (ref 30.0–36.0)
Platelets: 137 10*3/uL — ABNORMAL LOW (ref 150–400)
RBC: 3.73 MIL/uL — ABNORMAL LOW (ref 4.22–5.81)
RDW: 14.1 % (ref 11.5–15.5)

## 2011-11-11 LAB — TRIGLYCERIDES: Triglycerides: 113 mg/dL (ref <150)

## 2011-11-11 LAB — MAGNESIUM: Magnesium: 2 mg/dL (ref 1.5–2.5)

## 2011-11-11 LAB — CHOLESTEROL, TOTAL: Cholesterol: 92 mg/dL (ref 0–200)

## 2011-11-11 MED ORDER — INSULIN GLARGINE 100 UNIT/ML ~~LOC~~ SOLN
35.0000 [IU] | Freq: Every day | SUBCUTANEOUS | Status: DC
  Administered 2011-11-12: 100 [IU] via SUBCUTANEOUS
  Administered 2011-11-13: 35 [IU] via SUBCUTANEOUS

## 2011-11-11 MED ORDER — SODIUM CHLORIDE 0.9 % IV SOLN
20.0000 mmol | Freq: Once | INTRAVENOUS | Status: AC
  Administered 2011-11-11: 20 mmol via INTRAVENOUS
  Filled 2011-11-11: qty 6.67

## 2011-11-11 MED ORDER — SODIUM PHOSPHATE 3 MMOLE/ML IV SOLN
20.0000 mmol | Freq: Once | INTRAVENOUS | Status: DC
  Filled 2011-11-11: qty 6.67

## 2011-11-11 MED ORDER — POTASSIUM PHOSPHATE DIBASIC 3 MMOLE/ML IV SOLN: 10.0000 mmol | Freq: Once | INTRAVENOUS | Status: DC

## 2011-11-11 MED ORDER — TRACE MINERALS CR-CU-MN-SE-ZN 10-1000-500-60 MCG/ML IV SOLN
INTRAVENOUS | Status: AC
  Administered 2011-11-11: 17:00:00 via INTRAVENOUS
  Filled 2011-11-11: qty 1660

## 2011-11-11 MED ORDER — FAT EMULSION 20 % IV EMUL
250.0000 mL | INTRAVENOUS | Status: AC
  Administered 2011-11-11: 250 mL via INTRAVENOUS
  Filled 2011-11-11: qty 250

## 2011-11-11 NOTE — Progress Notes (Signed)
[facility] PROGRESS NOTE Subjective Positive flatus but no BM yet feels better Xray pending.  Objective: Vital signs in last 24 hours: Temp:  [97.4 F (36.3 C)-98.3 F (36.8 C)] 97.4 F (36.3 C) (04/15 0415) Pulse Rate:  [50-57] 52  (04/15 0415) Resp:  [18-20] 18  (04/15 0415) BP: (133-159)/(44-70) 159/56 mmHg (04/15 0415) SpO2:  [96 %-100 %] 96 % (04/15 0415) Last BM Date: 11/06/11  Intake/Output from previous day: 04/14 0701 - 04/15 0700 In: 0  Out: 1200 [Urine:1100; Emesis/NG output:100] Intake/Output this shift:    PE:Gen-alert no complaints  Lab Results:  [facility] 11/11/11 0430 11/09/11 0519  WBC 6.6 7.0  HGB 11.6* 12.2*  HCT 36.0* 37.2*  PLT 137* 149*   BMET  Basename 11/11/11 0430 11/09/11 0519  NA 134* 139  K 4.0 4.1  CL 106 112  CO2 22 20  CREATININE 1.08 1.42*   LFT  Basename 11/11/11 0430 11/09/11 0519  PROT 5.0* 5.1*  AST 25 42*  ALT 20 36  ALKPHOS 74 67  BILITOT 0.5 0.4  BILIDIR -- --  IBILI -- --   PT/INR No results found for this basename: LABPROT:3,INR:3 in the last 72 hours PANCREAS No results found for this basename: LIPASE:3 in the last 72 hours       Studies/Results: Dg Abd Portable 1v  11/10/2011  *RADIOLOGY REPORT*  Clinical Data: Abdominal distention.  Follow up ileus and NG tube placement.  PORTABLE ABDOMEN - 1 VIEW  Comparison: 11/08/2011  Findings: NG tube is identified with tip overlying the proximal stomach. There has been significant decrease in gaseous distention of the colon. Nondistended gas-filled loops of small bowel are present. No other significant abnormalities identified.  IMPRESSION: Significant DECREASE in colonic distention.  NG tube within the proximal stomach.  Original Report Authenticated By: Rosendo Gros, M.D.    Medications: I have reviewed the patient's current medications.  Assessment/Plan: 1. Ileus appears to be improving.  Will start CLs and continue to moniter the K.   [name]  JR,Kamani Magnussen L 11/11/2011, 7:17 AM

## 2011-11-11 NOTE — Progress Notes (Addendum)
PARENTERAL NUTRITION CONSULT NOTE - FOLLOW UP  Pharmacy Consult for TNA Indication: Prolonged NPO, ileus  No Known Allergies  Patient Measurements: Height: 5\' 9"  (175.3 cm) Weight: 256 lb (116.121 kg) IBW/kg (Calculated) : 70.7  Adjusted Body Weight: 89 kg  Vital Signs: Temp: 97.4 F (36.3 C) (04/15 0415) Temp src: Oral (04/15 0415) BP: 159/56 mmHg (04/15 0415) Pulse Rate: 52  (04/15 0415) Intake/Output from previous day: 04/14 0701 - 04/15 0700 In: 0  Out: 1200 [Urine:1100; Emesis/NG output:100] Intake/Output from this shift:    Labs:  [facility] 11/11/11 0430 11/09/11 0519  WBC 6.6 7.0  HGB 11.6* 12.2*  HCT 36.0* 37.2*  PLT 137* 149*  APTT -- --  INR -- --     [facility] 11/11/11 0430 11/09/11 0519  NA 134* 139  K 4.0 4.1  CL 106 112  CO2 22 20  GLUCOSE 218* 92  BUN 23 32*  CREATININE 1.08 1.42*  LABCREA -- --  CREAT24HRUR -- --  CALCIUM 8.2* 8.1*  MG 2.0 2.1  PHOS 1.9* 2.1*  PROT 5.0* 5.1*  ALBUMIN 2.2* 2.3*  AST 25 42*  ALT 20 36  ALKPHOS 74 67  BILITOT 0.5 0.4  BILIDIR -- --  IBILI -- --  PREALBUMIN -- 10.0*  TRIG 113 143  CHOLHDL -- --  CHOL 92 95   Estimated Creatinine Clearance: 64 ml/min (by C-G formula based on Cr of 1.08).    Basename 11/11/11 0805 11/11/11 0414 11/11/11 0004  GLUCAP 246* 206* 180*    Insulin Requirements in the past 24 hours:  18 units (moderate scale Novolog and 20 units Lantus qhs)  Current Nutrition:  Beginning Clear liquid diet  Assessment:  NG tube discontinued, less abdominal distention, advancing diet, passing flatus-no BM yet.  Xray rpt today, compare colonic distention.  CBG's remain high with TNA, anticipate even higher with CL diet.  Phosphorous level low, Potassium wnl.  Nutritional Goals:  2398 kCal, 106 grams of protein per day MVI and Trace elements only M,W,F due to national shortage.  Plan:   No advancement of TNA with diet order  Lipids daily and MVI/trace elements today  Change  formula to E 5/15 to help reduce insulin requirements.  Potassium phosphorous bolus today.  BMET, Phos level in am, following lytes.  No change to insulin orders with TNA change.  Otho Bellows PharmD Pager [phone] 11/11/2011,8:15 AM  Addendum: Sodium phosphorous not available, will order Potassium phosphorous bolus today.

## 2011-11-11 NOTE — Progress Notes (Signed)
Subjective: Patient has had some gas but no bowel movement, he looks much better   Objective: Vital signs in last 24 hours: Temp:  [97.4 F (36.3 C)-98.3 F (36.8 C)] 97.4 F (36.3 C) (04/15 0415) Pulse Rate:  [50-57] 52  (04/15 0415) Resp:  [18-20] 18  (04/15 0415) BP: (133-159)/(44-70) 159/56 mmHg (04/15 0415) SpO2:  [96 %-100 %] 96 % (04/15 0415) Weight change:  Last BM Date: 11/06/11  Intake/Output from previous day: 04/14 0701 - 04/15 0700 In: 0  Out: 1200 [Urine:1100; Emesis/NG output:100] Intake/Output this shift: Total I/O In: 0  Out: 450 [Urine:450]  General appearance: alert and cooperative Resp: clear to auscultation bilaterally Cardio: regular rate and rhythm, S1, S2 normal, no murmur, click, rub or gallop GI: soft, non-tender; bowel sounds normal; no masses,  no organomegaly Extremities: continued improvement in right lower extremity redness  Lab Results:  Results for orders placed during the hospital encounter of 11/06/11 (from the past 24 hour(s))  GLUCOSE, CAPILLARY     Status: Abnormal   Collection Time   11/10/11  4:09 PM      Component Value Range   Glucose-Capillary 183 (*) 70 - 99 (mg/dL)  GLUCOSE, CAPILLARY     Status: Abnormal   Collection Time   11/10/11  8:02 PM      Component Value Range   Glucose-Capillary 194 (*) 70 - 99 (mg/dL)   Comment 1 Notify RN    GLUCOSE, CAPILLARY     Status: Abnormal   Collection Time   11/11/11 12:04 AM      Component Value Range   Glucose-Capillary 180 (*) 70 - 99 (mg/dL)   Comment 1 Notify RN    GLUCOSE, CAPILLARY     Status: Abnormal   Collection Time   11/11/11  4:14 AM      Component Value Range   Glucose-Capillary 206 (*) 70 - 99 (mg/dL)   Comment 1 Notify RN    COMPREHENSIVE METABOLIC PANEL     Status: Abnormal   Collection Time   11/11/11  4:30 AM      Component Value Range   Sodium 134 (*) 135 - 145 (mEq/L)   Potassium 4.0  3.5 - 5.1 (mEq/L)   Chloride 106  96 - 112 (mEq/L)   CO2 22  19 - 32  (mEq/L)   Glucose, Bld 218 (*) 70 - 99 (mg/dL)   BUN 23  6 - 23 (mg/dL)   Creatinine, Ser 4.54  0.50 - 1.35 (mg/dL)   Calcium 8.2 (*) 8.4 - 10.5 (mg/dL)   Total Protein 5.0 (*) 6.0 - 8.3 (g/dL)   Albumin 2.2 (*) 3.5 - 5.2 (g/dL)   AST 25  0 - 37 (U/L)   ALT 20  0 - 53 (U/L)   Alkaline Phosphatase 74  39 - 117 (U/L)   Total Bilirubin 0.5  0.3 - 1.2 (mg/dL)   GFR calc non Af Amer 61 (*) >90 (mL/min)   GFR calc Af Amer 71 (*) >90 (mL/min)  MAGNESIUM     Status: Normal   Collection Time   11/11/11  4:30 AM      Component Value Range   Magnesium 2.0  1.5 - 2.5 (mg/dL)  PHOSPHORUS     Status: Abnormal   Collection Time   11/11/11  4:30 AM      Component Value Range   Phosphorus 1.9 (*) 2.3 - 4.6 (mg/dL)  CBC     Status: Abnormal   Collection Time   11/11/11  4:30 AM      Component Value Range   WBC 6.6  4.0 - 10.5 (K/uL)   RBC 3.73 (*) 4.22 - 5.81 (MIL/uL)   Hemoglobin 11.6 (*) 13.0 - 17.0 (g/dL)   HCT 78.2 (*) 95.6 - 52.0 (%)   MCV 96.5  78.0 - 100.0 (fL)   MCH 31.1  26.0 - 34.0 (pg)   MCHC 32.2  30.0 - 36.0 (g/dL)   RDW 21.3  08.6 - 57.8 (%)   Platelets 137 (*) 150 - 400 (K/uL)  DIFFERENTIAL     Status: Normal   Collection Time   12-10-2011  4:30 AM      Component Value Range   Neutrophils Relative 69  43 - 77 (%)   Neutro Abs 4.6  1.7 - 7.7 (K/uL)   Lymphocytes Relative 18  12 - 46 (%)   Lymphs Abs 1.2  0.7 - 4.0 (K/uL)   Monocytes Relative 10  3 - 12 (%)   Monocytes Absolute 0.7  0.1 - 1.0 (K/uL)   Eosinophils Relative 3  0 - 5 (%)   Eosinophils Absolute 0.2  0.0 - 0.7 (K/uL)   Basophils Relative 0  0 - 1 (%)   Basophils Absolute 0.0  0.0 - 0.1 (K/uL)  CHOLESTEROL, TOTAL     Status: Normal   Collection Time   Dec 10, 2011  4:30 AM      Component Value Range   Cholesterol 92  0 - 200 (mg/dL)  TRIGLYCERIDES     Status: Normal   Collection Time   12-10-11  4:30 AM      Component Value Range   Triglycerides 113  <150 (mg/dL)  PREALBUMIN     Status: Abnormal   Collection  Time   10-Dec-2011  4:30 AM      Component Value Range   Prealbumin 11.6 (*) 17.0 - 34.0 (mg/dL)  GLUCOSE, CAPILLARY     Status: Abnormal   Collection Time   12-10-2011  8:05 AM      Component Value Range   Glucose-Capillary 246 (*) 70 - 99 (mg/dL)   Comment 1 Documented in Chart     Comment 2 Notify RN    GLUCOSE, CAPILLARY     Status: Abnormal   Collection Time   2011/12/10 11:57 AM      Component Value Range   Glucose-Capillary 206 (*) 70 - 99 (mg/dL)   Comment 1 Documented in Chart     Comment 2 Notify RN        Studies/Results: Dg Abd 2 Views  12/10/11  *RADIOLOGY REPORT*  Clinical Data: Follow up ileus.  ABDOMEN - 2 VIEW 12-10-11:  Comparison: Portable abdomen x-ray yesterday, 11/07/2011, and two- view abdomen x-ray 11/08/2011.  Findings: Bowel gas pattern now unremarkable, with resolution of the colonic and small bowel distention.  Moderate stool remains in the ascending colon and transverse colon.  No evidence of free air on the erect image.  No visible opaque urinary tract calculi.  IMPRESSION: Resolution of colonic and small bowel ileus.  No acute abdominal abnormality currently.  Original Report Authenticated By: Arnell Sieving, M.Lindsey.   Dg Abd Portable 1v  11/10/2011  *RADIOLOGY REPORT*  Clinical Data: Abdominal distention.  Follow up ileus and NG tube placement.  PORTABLE ABDOMEN - 1 VIEW  Comparison: 11/08/2011  Findings: NG tube is identified with tip overlying the proximal stomach. There has been significant decrease in gaseous distention of the colon. Nondistended gas-filled loops of small bowel are present. No  other significant abnormalities identified.  IMPRESSION: Significant DECREASE in colonic distention.  NG tube within the proximal stomach.  Original Report Authenticated By: Rosendo Gros, M.Lindsey.    Medications:  Prior to Admission:  Prescriptions prior to admission  Medication Sig Dispense Refill  . aspirin 81 MG chewable tablet Chew 81 mg by mouth daily.      .  furosemide (LASIX) 20 MG tablet Take 20 mg by mouth 2 (two) times daily.      . insulin glargine (LANTUS) 100 UNIT/ML injection Inject 65 Units into the skin daily.      Marland Kitchen lisinopril-hydrochlorothiazide (PRINZIDE,ZESTORETIC) 10-12.5 MG per tablet Take 1 tablet by mouth daily.      . methocarbamol (ROBAXIN) 750 MG tablet Take 750 mg by mouth every 8 (eight) hours as needed. For pain      . omeprazole (PRILOSEC) 20 MG capsule Take 20 mg by mouth daily.      . predniSONE (DELTASONE) 10 MG tablet Take 10 mg by mouth daily. Patient takes 6,5,4,3,2,1       Scheduled:   . antiseptic oral rinse  15 mL Mouth Rinse q12n4p  .  ceFAZolin (ANCEF) IV  1 g Intravenous Q8H  . chlorhexidine  15 mL Mouth Rinse BID  . heparin  5,000 Units Subcutaneous Q8H  . insulin aspart  0-15 Units Subcutaneous Q4H  . insulin glargine  20 Units Subcutaneous Daily  . pantoprazole (PROTONIX) IV  40 mg Intravenous Q1200  . polyethylene glycol  17 g Oral Daily  . potassium phosphate IVPB (mmol)  20 mmol Intravenous Once  . sodium chloride  10-40 mL Intracatheter Q12H  . DISCONTD: potassium phosphate IVPB (mmol)  10 mmol Intravenous Once  . DISCONTD: sodium phosphate  Dextrose 5% IVPB  20 mmol Intravenous Once   Continuous:   . sodium chloride 85 mL/hr at 11/09/11 2200  . sodium chloride    . TPN (CLINIMIX) +/- additives 40 mL/hr at 11/09/11 1844   And  . fat emulsion 240 mL (11/09/11 1845)  . TPN (CLINIMIX) +/- additives 65 mL/hr at 11/10/11 1753   And  . fat emulsion 240 mL (11/10/11 1754)  . fat emulsion    . TPN (CLINIMIX) +/- additives      Assessment/Plan: Ileus appears to be resolving based on x-ray, there is significant decrease in gaseous distention of colon, no signs of small bowel obstruction. Diet advanced by GI. Patient looks much better, abdominal exam consistent with his baseline. If he tolerates diet we will DC TNA Cellulitis improved, continue 7 days antibiotics Chronic kidney disease, current  creatinine significantly better than baseline creatinine Diabetes Chronic back pain Deconditioning start physical therapy.  LOS: 5 days   Ronald Lindsey 11/11/2011, 12:58 PM

## 2011-11-11 NOTE — Progress Notes (Signed)
Patient ID: Ronald Lindsey, male   DOB: 1925/02/14, 78 y.o.   MRN: [id number]  General Surgery - [facility] - Progress Note  Subjective: Patient without complaints.  No nausea since NG removed.  Passing flatus.  No BM.  Objective: Vital signs in last 24 hours: Temp:  [97.4 F (36.3 C)-98.3 F (36.8 C)] 97.4 F (36.3 C) (04/15 0415) Pulse Rate:  [50-57] 52  (04/15 0415) Resp:  [18-20] 18  (04/15 0415) BP: (133-159)/(44-70) 159/56 mmHg (04/15 0415) SpO2:  [96 %-100 %] 96 % (04/15 0415) Last BM Date: 11/06/11  Intake/Output from previous day: 04/14 0701 - 04/15 0700 In: 0  Out: 1200 [Urine:1100; Emesis/NG output:100]  Exam: HEENT - clear, not icteric Neck - soft Chest - clear bilaterally Cor - RRR, no murmur Abd - protuberant; BS present; umbilical hernia soft; no tenderness Ext - no significant edema Neuro - grossly intact, no focal deficits  Lab Results:   [facility] 11/11/11 0430 11/09/11 0519  WBC 6.6 7.0  HGB 11.6* 12.2*  HCT 36.0* 37.2*  PLT 137* 149*     Basename 11/11/11 0430 11/09/11 0519  NA 134* 139  K 4.0 4.1  CL 106 112  CO2 22 20  GLUCOSE 218* 92  BUN 23 32*  CREATININE 1.08 1.42*  CALCIUM 8.2* 8.1*    Studies/Results: Dg Abd Portable 1v  11/10/2011  *RADIOLOGY REPORT*  Clinical Data: Abdominal distention.  Follow up ileus and NG tube placement.  PORTABLE ABDOMEN - 1 VIEW  Comparison: 11/08/2011  Findings: NG tube is identified with tip overlying the proximal stomach. There has been significant decrease in gaseous distention of the colon. Nondistended gas-filled loops of small bowel are present. No other significant abnormalities identified.  IMPRESSION: Significant DECREASE in colonic distention.  NG tube within the proximal stomach.  Original Report Authenticated By: Rosendo Gros, M.D.    Assessment: Colonic ileus AXR this AM with scattered gas pattern, no obstruction  Plan: Clear liquid diet per GI consultant OOB,  ambulate Will follow with you  Velora Heckler, MD, [facility] Office: [phone]  11/11/2011

## 2011-11-12 ENCOUNTER — Inpatient Hospital Stay (HOSPITAL_COMMUNITY): Payer: Medicare Other

## 2011-11-12 LAB — BASIC METABOLIC PANEL
CO2: 24 mEq/L (ref 19–32)
Chloride: 106 mEq/L (ref 96–112)
Sodium: 134 mEq/L — ABNORMAL LOW (ref 135–145)

## 2011-11-12 LAB — GLUCOSE, CAPILLARY
Glucose-Capillary: 107 mg/dL — ABNORMAL HIGH (ref 70–99)
Glucose-Capillary: 181 mg/dL — ABNORMAL HIGH (ref 70–99)
Glucose-Capillary: 191 mg/dL — ABNORMAL HIGH (ref 70–99)
Glucose-Capillary: 196 mg/dL — ABNORMAL HIGH (ref 70–99)
Glucose-Capillary: 210 mg/dL — ABNORMAL HIGH (ref 70–99)
Glucose-Capillary: 214 mg/dL — ABNORMAL HIGH (ref 70–99)

## 2011-11-12 LAB — PHOSPHORUS: Phosphorus: 2.4 mg/dL (ref 2.3–4.6)

## 2011-11-12 MED ORDER — OXYCODONE HCL 5 MG PO TABS
5.0000 mg | ORAL_TABLET | Freq: Once | ORAL | Status: AC
  Administered 2011-11-12: 5 mg via ORAL
  Filled 2011-11-12: qty 1

## 2011-11-12 MED ORDER — POLYETHYLENE GLYCOL 3350 17 G PO PACK
17.0000 g | PACK | Freq: Two times a day (BID) | ORAL | Status: DC
  Administered 2011-11-12 – 2011-11-13 (×3): 17 g via ORAL
  Filled 2011-11-12 (×6): qty 1

## 2011-11-12 NOTE — Progress Notes (Signed)
Patient ID: Ronald Lindsey, male   DOB: May 16, 1925, 78 y.o.   MRN: [id number]  General Surgery - [facility] - Progress Note  Subjective: Patient up to chair.  Wife at bedside.  Complains of low back pain.  No abdominal complaints. Awaiting lunch.  Ambulated.  Objective: Vital signs in last 24 hours: Temp:  [97.5 F (36.4 C)-98.2 F (36.8 C)] 97.5 F (36.4 C) (04/16 0531) Pulse Rate:  [46-54] 54  (04/16 0531) Resp:  [18-20] 18  (04/16 0531) BP: (155-181)/(51-62) 155/51 mmHg (04/16 0531) SpO2:  [95 %-99 %] 97 % (04/16 1003) Last BM Date: 11/06/11  Intake/Output from previous day: 2022-11-30 0701 - 04/16 0700 In: 4231.1 [P.O.:240; I.V.:800; TPN:3191.1] Out: 2500 [Urine:2500]  Exam: HEENT - clear, not icteric Neck - soft Chest - clear bilaterally Cor - RRR, no murmur Abd - protuberant, non-tender; few BS Ext - no significant edema Neuro - grossly intact, no focal deficits  Lab Results:   Basename 2011/11/30 0430  WBC 6.6  HGB 11.6*  HCT 36.0*  PLT 137*     Basename 11/12/11 0620 2011/11/30 0430  NA 134* 134*  K 4.3 4.0  CL 106 106  CO2 24 22  GLUCOSE 233* 218*  BUN 20 23  CREATININE 1.08 1.08  CALCIUM 8.1* 8.2*    Studies/Results: Dg Abd 2 Views  11/30/2011  *RADIOLOGY REPORT*  Clinical Data: Follow up ileus.  ABDOMEN - 2 VIEW 11-30-2011:  Comparison: Portable abdomen x-ray yesterday, 11/07/2011, and two- view abdomen x-ray 11/08/2011.  Findings: Bowel gas pattern now unremarkable, with resolution of the colonic and small bowel distention.  Moderate stool remains in the ascending colon and transverse colon.  No evidence of free air on the erect image.  No visible opaque urinary tract calculi.  IMPRESSION: Resolution of colonic and small bowel ileus.  No acute abdominal abnormality currently.  Original Report Authenticated By: Arnell Sieving, M.D.    Assessment / Plan: 1.  Ileus - resolved  - diet advanced by GI  - bowel regimen per GI Will sign  off.  Call if surgical issues arise.  Velora Heckler, MD, [facility] Office: [phone]  11/12/2011

## 2011-11-12 NOTE — Progress Notes (Signed)
PARENTERAL NUTRITION CONSULT NOTE - FOLLOW UP  Pharmacy Consult for TNA Indication: Prolonged NPO, ileus  No Known Allergies  Patient Measurements: Height: 5\' 9"  (175.3 cm) Weight: 256 lb (116.121 kg) IBW/kg (Calculated) : 70.7  Adjusted Body Weight: 89 kg  Vital Signs: Temp: 97.5 F (36.4 C) (04/16 0531) Temp src: Oral (04/16 0531) BP: 155/51 mmHg (04/16 0531) Pulse Rate: 54  (04/16 0531) Intake/Output from previous day: 04/15 0701 - 04/16 0700 In: 4231.1 [P.O.:240; I.V.:800; TPN:3191.1] Out: 2500 [Urine:2500] Intake/Output from this shift: Total I/O In: 240 [P.O.:240] Out: 625 [Urine:625]  Labs:  [facility] 11/11/11 0430  WBC 6.6  HGB 11.6*  HCT 36.0*  PLT 137*  APTT --  INR --     [facility] 11/12/11 0620 11/11/11 0430  NA 134* 134*  K 4.3 4.0  CL 106 106  CO2 24 22  GLUCOSE 233* 218*  BUN 20 23  CREATININE 1.08 1.08  LABCREA -- --  CREAT24HRUR -- --  CALCIUM 8.1* 8.2*  MG -- 2.0  PHOS 2.4 1.9*  PROT -- 5.0*  ALBUMIN -- 2.2*  AST -- 25  ALT -- 20  ALKPHOS -- 74  BILITOT -- 0.5  BILIDIR -- --  IBILI -- --  PREALBUMIN -- 11.6*  TRIG -- 113  CHOLHDL -- --  CHOL -- 92   Estimated Creatinine Clearance: 64 ml/min (by C-G formula based on Cr of 1.08).    Basename 11/12/11 0732 11/12/11 0321 11/11/11 2349  GLUCAP 196* 191* 181*    Insulin Requirements in the past 24 hours:  18 units (moderate scale Novolog and 20 units Lantus qhs)  Current Nutrition:  Beginning Clear liquid diet  Assessment:  NG tube discontinued, less abdominal distention, advancing diet, passing flatus-no BM yet.  Xray rpt today, compare colonic distention.  CBG's remain high with TNA, anticipate even higher with CL diet.  Phosphorous level low, Potassium wnl.  Nutritional Goals:  2398 kCal, 106 grams of protein per day MVI and Trace elements only M,W,F due to national shortage.  Plan:   D/C TNA today, full liquid lunch.  Reduce rate in half now to avoid  fluctuations in glucose levels.  Continuing Novolog and Lantus per MD.  Otho Bellows PharmD Pager [phone] 11/12/2011,1:19 PM

## 2011-11-12 NOTE — Progress Notes (Signed)
[facility] PROGRESS NOTE Subjective Much better, no BM but lots of flatus, tolerating CLs  Objective: Vital signs in last 24 hours: Temp:  [97.5 F (36.4 C)-98.2 F (36.8 C)] 97.5 F (36.4 C) (04/16 0531) Pulse Rate:  [46-54] 54  (04/16 0531) Resp:  [18-20] 18  (04/16 0531) BP: (155-181)/(51-62) 155/51 mmHg (04/16 0531) SpO2:  [95 %-99 %] 95 % (04/16 0531) Last BM Date: 11/06/11  Intake/Output from previous day: 2022/11/26 0701 - 04/16 0700 In: 4231.1 [P.O.:240; I.V.:800; TPN:3191.1] Out: 2500 [Urine:2500] Intake/Output this shift:    PE:Gen-eating no distress Abd- distended but soft good BSs Lab Results:  Basename 26-Nov-2011 0430  WBC 6.6  HGB 11.6*  HCT 36.0*  PLT 137*   BMET  Basename 11/12/11 0620 11-26-2011 0430  NA 134* 134*  K 4.3 4.0  CL 106 106  CO2 24 22  CREATININE 1.08 1.08   LFT  Basename 11/26/11 0430  PROT 5.0*  AST 25  ALT 20  ALKPHOS 74  BILITOT 0.5  BILIDIR --  IBILI --   PT/INR No results found for this basename: LABPROT:3,INR:3 in the last 72 hours PANCREAS No results found for this basename: LIPASE:3 in the last 72 hours       Studies/Results: Dg Abd 2 Views  11-26-11  *RADIOLOGY REPORT*  Clinical Data: Follow up ileus.  ABDOMEN - 2 VIEW 26-Nov-2011:  Comparison: Portable abdomen x-ray yesterday, 11/07/2011, and two- view abdomen x-ray 11/08/2011.  Findings: Bowel gas pattern now unremarkable, with resolution of the colonic and small bowel distention.  Moderate stool remains in the ascending colon and transverse colon.  No evidence of free air on the erect image.  No visible opaque urinary tract calculi.  IMPRESSION: Resolution of colonic and small bowel ileus.  No acute abdominal abnormality currently.  Original Report Authenticated By: Arnell Sieving, M.D.   Dg Abd Portable 1v  11/10/2011  *RADIOLOGY REPORT*  Clinical Data: Abdominal distention.  Follow up ileus and NG tube placement.  PORTABLE ABDOMEN - 1 VIEW   Comparison: 11/08/2011  Findings: NG tube is identified with tip overlying the proximal stomach. There has been significant decrease in gaseous distention of the colon. Nondistended gas-filled loops of small bowel are present. No other significant abnormalities identified.  IMPRESSION: Significant DECREASE in colonic distention.  NG tube within the proximal stomach.  Original Report Authenticated By: Rosendo Gros, M.D.    Medications: I have reviewed the patient's current medications.  Assessment/Plan: 1. Ileus resolved. Agree with advancing diet. Will increase the miralax.  Watch sugar.  Main complaint is back pain   Savvas Roper JR,Albaraa Swingle L 11/12/2011, 8:57 AM

## 2011-11-12 NOTE — Progress Notes (Signed)
Initial visit with pt and wife at bedside.    Chaplain visited pt with volunteer musician.  Musician played several pieces of music for pt and wife.  Family was receptive and appreciative of music.   Pt spoke with chaplain about uncertainty of condition and length of hospital stay.   Pt and wife expressed frustration that their minister had not been to see them and requested prayer with chaplain.    Chaplain prayed with family and provided emotional and spiritual support.   Will follow up with family for continued assessment and support.     Belva Crome MDiv.    11/12/11 1400  Clinical Encounter Type  Visited With Patient and family together  Visit Type Initial;Other (Comment);Psychological support;Spiritual support;Social support (musician)  Recommendations Follow up for continued support  Spiritual Encounters  Spiritual Needs Emotional;Prayer

## 2011-11-12 NOTE — Progress Notes (Signed)
Subjective: Patient complaining of low back pain, he's able to elevate both legs off the bed without any problem. He has a history of chronic back pain with a history of lumbar discectomy. He denies any recent fall injury. He has not received any improvement with prednisone taper on an outpatient basis prior to admission. He's had epidural injections in the past. In regards to his bowels he still has not had a bowel movement, there is no nausea no vomiting no emesis no diarrhea. He's tolerating p.o. intake Objective: Vital signs in last 24 hours: Temp:  [97.5 F (36.4 C)-98.2 F (36.8 C)] 97.5 F (36.4 C) (04/16 0531) Pulse Rate:  [46-54] 54  (04/16 0531) Resp:  [18-20] 18  (04/16 0531) BP: (155-181)/(51-62) 155/51 mmHg (04/16 0531) SpO2:  [95 %-99 %] 95 % (04/16 0531) Weight change:  Last BM Date: 11/06/11  Intake/Output from previous day: 04/15 0701 - 04/16 0700 In: 4231.1 [P.O.:240; I.V.:800; TPN:3191.1] Out: 2500 [Urine:2500] Intake/Output this shift:    General appearance: alert and cooperative Resp: clear to auscultation bilaterally Cardio: regular rate and rhythm, S1, S2 normal, no murmur, click, rub or gallop GI: soft, non-tender; bowel sounds normal; no masses,  no organomegaly Extremities: resolved right lower extremity erythema  Lab Results:  Results for orders placed during the hospital encounter of 11/06/11 (from the past 24 hour(s))  GLUCOSE, CAPILLARY     Status: Abnormal   Collection Time   11/11/11 11:57 AM      Component Value Range   Glucose-Capillary 206 (*) 70 - 99 (mg/dL)   Comment 1 Documented in Chart     Comment 2 Notify RN    GLUCOSE, CAPILLARY     Status: Abnormal   Collection Time   11/11/11  4:35 PM      Component Value Range   Glucose-Capillary 200 (*) 70 - 99 (mg/dL)   Comment 1 Documented in Chart     Comment 2 Notify RN    GLUCOSE, CAPILLARY     Status: Abnormal   Collection Time   11/11/11  7:55 PM      Component Value Range   Glucose-Capillary 210 (*) 70 - 99 (mg/dL)  GLUCOSE, CAPILLARY     Status: Abnormal   Collection Time   11/11/11 11:49 PM      Component Value Range   Glucose-Capillary 181 (*) 70 - 99 (mg/dL)  GLUCOSE, CAPILLARY     Status: Abnormal   Collection Time   11/12/11  3:21 AM      Component Value Range   Glucose-Capillary 191 (*) 70 - 99 (mg/dL)  BASIC METABOLIC PANEL     Status: Abnormal   Collection Time   11/12/11  6:20 AM      Component Value Range   Sodium 134 (*) 135 - 145 (mEq/L)   Potassium 4.3  3.5 - 5.1 (mEq/L)   Chloride 106  96 - 112 (mEq/L)   CO2 24  19 - 32 (mEq/L)   Glucose, Bld 233 (*) 70 - 99 (mg/dL)   BUN 20  6 - 23 (mg/dL)   Creatinine, Ser 1.47  0.50 - 1.35 (mg/dL)   Calcium 8.1 (*) 8.4 - 10.5 (mg/dL)   GFR calc non Af Amer 61 (*) >90 (mL/min)   GFR calc Af Amer 71 (*) >90 (mL/min)  PHOSPHORUS     Status: Normal   Collection Time   11/12/11  6:20 AM      Component Value Range   Phosphorus 2.4  2.3 -  4.6 (mg/dL)  GLUCOSE, CAPILLARY     Status: Abnormal   Collection Time   11/12/11  7:32 AM      Component Value Range   Glucose-Capillary 196 (*) 70 - 99 (mg/dL)   Comment 1 Documented in Chart     Comment 2 Notify RN        Studies/Results: Dg Abd 2 Views  11/25/11  *RADIOLOGY REPORT*  Clinical Data: Follow up ileus.  ABDOMEN - 2 VIEW 11-25-2011:  Comparison: Portable abdomen x-ray yesterday, 11/07/2011, and two- view abdomen x-ray 11/08/2011.  Findings: Bowel gas pattern now unremarkable, with resolution of the colonic and small bowel distention.  Moderate stool remains in the ascending colon and transverse colon.  No evidence of free air on the erect image.  No visible opaque urinary tract calculi.  IMPRESSION: Resolution of colonic and small bowel ileus.  No acute abdominal abnormality currently.  Original Report Authenticated By: Arnell Sieving, M.D.   Dg Abd Portable 1v  11/10/2011  *RADIOLOGY REPORT*  Clinical Data: Abdominal distention.  Follow up ileus  and NG tube placement.  PORTABLE ABDOMEN - 1 VIEW  Comparison: 11/08/2011  Findings: NG tube is identified with tip overlying the proximal stomach. There has been significant decrease in gaseous distention of the colon. Nondistended gas-filled loops of small bowel are present. No other significant abnormalities identified.  IMPRESSION: Significant DECREASE in colonic distention.  NG tube within the proximal stomach.  Original Report Authenticated By: Rosendo Gros, M.D.    Medications:  Prior to Admission:  Prescriptions prior to admission  Medication Sig Dispense Refill  . aspirin 81 MG chewable tablet Chew 81 mg by mouth daily.      . furosemide (LASIX) 20 MG tablet Take 20 mg by mouth 2 (two) times daily.      . insulin glargine (LANTUS) 100 UNIT/ML injection Inject 65 Units into the skin daily.      Marland Kitchen lisinopril-hydrochlorothiazide (PRINZIDE,ZESTORETIC) 10-12.5 MG per tablet Take 1 tablet by mouth daily.      . methocarbamol (ROBAXIN) 750 MG tablet Take 750 mg by mouth every 8 (eight) hours as needed. For pain      . omeprazole (PRILOSEC) 20 MG capsule Take 20 mg by mouth daily.      . predniSONE (DELTASONE) 10 MG tablet Take 10 mg by mouth daily. Patient takes 6,5,4,3,2,1       Scheduled:   . antiseptic oral rinse  15 mL Mouth Rinse q12n4p  .  ceFAZolin (ANCEF) IV  1 g Intravenous Q8H  . chlorhexidine  15 mL Mouth Rinse BID  . heparin  5,000 Units Subcutaneous Q8H  . insulin aspart  0-15 Units Subcutaneous Q4H  . insulin glargine  35 Units Subcutaneous Daily  . oxyCODONE  5 mg Oral Once  . pantoprazole (PROTONIX) IV  40 mg Intravenous Q1200  . polyethylene glycol  17 g Oral Daily  . potassium phosphate IVPB (mmol)  20 mmol Intravenous Once  . sodium chloride  10-40 mL Intracatheter Q12H  . DISCONTD: insulin glargine  20 Units Subcutaneous Daily   Continuous:   . sodium chloride    . TPN (CLINIMIX) +/- additives 65 mL/hr at 11/10/11 1753   And  . fat emulsion 240 mL (11/10/11  1754)  . fat emulsion 250 mL (2011/11/25 1728)  . TPN (CLINIMIX) +/- additives 65 mL/hr at 2011/11/25 1727    Assessment/Plan: Nausea vomiting diarrhea resolved,x-ray from yesterday shows significant improvement, continued to push diet, d/c TNA, mobilize as  much as possible Back pain in a patient with history of chronic low back pain, obtain x-ray as his pain appears to limit his ability to ambulate, rule out compression fracture versus other pathology, patient may warrant epidural injection if no other pathology found. Would like to avoid narcotics as much as possible as this will more than likely prolonged his ileus Diabetes Hypertension Morbid obesity Deconditioning Cellulitis, clinically resolved Chronic kidney disease stage III  LOS: 6 days   Hansika Leaming D 11/12/2011, 8:42 AM

## 2011-11-12 NOTE — Evaluation (Signed)
Physical Therapy Evaluation Patient Details Name: MARCELLE BEBOUT MRN: [id number] DOB: 21-Oct-1924 Today's Date: 11/12/2011  Problem List:  Patient Active Problem List  Diagnoses  . Diabetic retinopathy  . Macular degeneration  . Diabetic neuropathy  . HTN (hypertension)  . GERD (gastroesophageal reflux disease)  . Back pain  . Gastroenteritis  . Renal failure  . DM (diabetes mellitus)  . Previous back surgery  . S/P appendectomy  . Cellulitis of right leg    Past Medical History:  Past Medical History  Diagnosis Date  . Diabetes mellitus   . Diabetic retinopathy   . Macular degeneration   . Diabetic neuropathy   . HTN (hypertension)   . GERD (gastroesophageal reflux disease)   . Back pain   . Previous back surgery 11/06/2011  . S/P appendectomy 11/06/2011   Past Surgical History:  Past Surgical History  Procedure Date  . Brain surgery   . Implantable contact lens implantation     RT eye successful, LT eye is difficult to see - pt states  . Cataract extraction     bilateral eyes  . Colonoscopy 11/08/2011    Procedure: COLONOSCOPY;  Surgeon: Graylin Shiver, MD;  Location: [facility];  Service: Endoscopy;  Laterality: N/A;  with decompression    PT Assessment/Plan/Recommendation PT Assessment Clinical Impression Statement: 78 yo admitted with abdominal distension and  partial ileus. He also has low back pain and DM with peripheral neuropathy  He now has deconditioning with generalized muscle weakness.  He will benefit from continued PT at D/C, either at [facility] or [facility]  He would like to talke with case manager to find out what his insurance benefits are before he makes his decision.  PT Recommendation/Assessment: Patient will need skilled PT in the acute care venue PT Problem List: Decreased strength;Decreased activity tolerance;Decreased mobility;Decreased knowledge of use of DME PT Therapy Diagnosis : Difficulty walking;Abnormality of gait;Generalized weakness PT  Plan PT Frequency: Min 3X/week PT Treatment/Interventions: DME instruction;Gait training;Stair training;Functional mobility training;Therapeutic activities;Therapeutic exercise;Patient/family education PT Recommendation Recommendations for Other Services: OT consult Follow Up Recommendations: Home health PT;Supervision - Intermittent;Skilled nursing facility Equipment Recommended: Rolling walker with 5" wheels;3 in 1 bedside comode;Defer to next venue PT Goals  Acute Rehab PT Goals PT Goal Formulation: With patient/family Time For Goal Achievement: 76 weeks Pt will Roll Supine to Right Side: Independently PT Goal: Rolling Supine to Right Side - Progress: Goal set today Pt will Roll Supine to Left Side: Independently PT Goal: Rolling Supine to Left Side - Progress: Goal set today Pt will go Supine/Side to Sit: Independently PT Goal: Supine/Side to Sit - Progress: Goal set today Pt will go Sit to Supine/Side: Independently PT Goal: Sit to Supine/Side - Progress: Goal set today Pt will go Sit to Stand: Independently PT Goal: Sit to Stand - Progress: Goal set today Pt will go Stand to Sit: Independently PT Goal: Stand to Sit - Progress: Goal set today Pt will Ambulate: >150 feet;with modified independence;with least restrictive assistive device PT Goal: Ambulate - Progress: Goal set today Pt will Go Up / Down Stairs: 3-5 stairs;with rail(s);with supervision PT Goal: Up/Down Stairs - Progress: Goal set today Pt will Perform Home Exercise Program: with supervision, verbal cues required/provided PT Goal: Perform Home Exercise Program - Progress: Goal set today  PT Evaluation Precautions/Restrictions  Restrictions Weight Bearing Restrictions: No Prior Functioning  Home Living Lives With: Spouse Available Help at Discharge: Family Type of Home: House Home Access: Stairs to enter [employer] of  Steps: 5 Entrance Stairs-Rails: Can reach both;Right;Left Home Layout: One  level Bathroom Toilet: Handicapped height Home Adaptive Equipment: Grab bars around toilet;Grab bars in shower Prior Function Level of Independence: Independent Able to Take Stairs?: Yes Cognition Cognition Arousal/Alertness: Awake/alert Overall Cognitive Status: Appears within functional limits for tasks assessed Orientation Level: Oriented X4 Sensation/Coordination Sensation Light Touch: Impaired by gross assessment (pt reports diabetic neuropathy) Proprioception: Impaired by gross assessment Additional Comments: pt with more sensory impairment on right than left leg Coordination Gross Motor Movements are Fluid and Coordinated: Yes (slow) Extremity Assessment RLE Assessment RLE Assessment: Exceptions to [facility] RLE AROM (degrees) RLE Overall AROM Comments: pt with scabbed open area on ant shin.  Mild edema in ankle and foot with some tophic changes of skin on feet and toes LLE Assessment LLE Assessment: Exceptions to WFL LLE AROM (degrees) LLE Overall AROM Comments: pt with at least 3/5 muscle strenght and WFL ROM, Some edema in ankle and foot Mobility (including Balance) Bed Mobility Bed Mobility: No Transfers Transfers: Yes Sit to Stand: 4: Min assist Sit to Stand Details (indicate cue type and reason): movement slow.  Pt needs some cues to push up with arms and extend trunk in standing Stand to Sit: 4: Min assist Stand to Sit Details: cues to reach back with arms Ambulation/Gait Ambulation/Gait: Yes Ambulation/Gait Assistance: 5: Supervision Ambulation/Gait Assistance Details (indicate cue type and reason): Gait slow, but steady with RW. Pt ad abdominal extension, but is able to walk with RW Ambulation Distance (Feet): 200 Feet Assistive device: Rolling walker Gait Pattern: Step-through pattern Gait velocity: decreased Stairs: No Wheelchair Mobility Wheelchair Mobility: No  Posture/Postural Control Posture/Postural Control: Postural limitations Postural Limitations:  pt with large protuding abdomen Balance Balance Assessed: No Exercise  General Exercises - Lower Extremity Ankle Circles/Pumps: AROM;Right;10 reps;Seated Gluteal Sets: AROM;Both;Standing;5 reps Long Arc Quad: AROM;Both;5 reps;Seated Hip Flexion/Marching: AROM;Both;5 reps;Seated Other Exercises Other Exercises: abd sets Other Exercises: bilateral  U/E hip to hip to activate core End of Session PT - End of Session Equipment Utilized During Treatment: Gait belt Activity Tolerance: Patient tolerated treatment well Patient left: in chair;with family/visitor present;with call bell in reach Nurse Communication: Mobility status for ambulation General Behavior During Session: [facility] for tasks performed Cognition: [facility] for tasks performed  Donnetta Hail 11/12/2011, 11:34 AM

## 2011-11-13 LAB — GLUCOSE, CAPILLARY
Glucose-Capillary: 100 mg/dL — ABNORMAL HIGH (ref 70–99)
Glucose-Capillary: 136 mg/dL — ABNORMAL HIGH (ref 70–99)

## 2011-11-13 MED ORDER — POLYETHYLENE GLYCOL 3350 17 G PO PACK: 17.0000 g | PACK | Freq: Two times a day (BID) | ORAL | Status: AC

## 2011-11-13 MED ORDER — INSULIN GLARGINE 100 UNIT/ML ~~LOC~~ SOLN: 40.0000 [IU] | Freq: Every day | SUBCUTANEOUS | Status: DC

## 2011-11-13 MED ORDER — PANTOPRAZOLE SODIUM 40 MG PO TBEC
40.0000 mg | DELAYED_RELEASE_TABLET | Freq: Every day | ORAL | Status: DC
  Administered 2011-11-13: 40 mg via ORAL
  Filled 2011-11-13: qty 1

## 2011-11-13 NOTE — Progress Notes (Signed)
The patient is receiving Protonix by the intravenous route.  Based on criteria approved by the Pharmacy and Therapeutics Committee and the Medical Executive Committee, the medication is being converted to the equivalent oral dose form.  These criteria include: -No Active GI bleeding -Able to tolerate diet of full liquids (or better) or tube feeding -Able to tolerate other medications by the oral or enteral route  If you have any questions about this conversion, please contact the Pharmacy Department (ext [phone].  Thank you.  Charolotte Eke, PharmD, pager [phone]. 11/13/2011,7:53 AM.

## 2011-11-13 NOTE — Progress Notes (Signed)
Follow up with pt and wife.  Pt spoke with chaplain about life events and hopes for discharge.   Provided spiritual and emotional support.  Will continue to follow as needed.    Belva Crome  MDiv    11/13/11 1300  Clinical Encounter Type  Visited With Patient and family together  Visit Type Follow-up;Psychological support;Spiritual support;Social support  Spiritual Encounters  Spiritual Needs Emotional;Prayer

## 2011-11-13 NOTE — Discharge Summary (Signed)
Physician Discharge Summary  Patient ID: Ronald Lindsey MRN: [id number] DOB/AGE: Jan 12, 1925 78 y.o.  Admit date: 11/06/2011 Discharge date: 11/13/2011  Admission Diagnoses: Nausea vomiting and diarrhea  Discharge Diagnoses:  Principal Problem:  *Gastroenteritis Active Problems:  Diabetic retinopathy  Macular degeneration  Diabetic neuropathy  HTN (hypertension)  GERD (gastroesophageal reflux disease)  Back pain  Renal failure  DM (diabetes mellitus)  Previous back surgery  S/P appendectomy  Cellulitis of right leg chronic low back pain, improved at time of discharge, x-ray negative for any acute pathology, patient ambulated well with physical therapy. Refused skilled nursing facility, will accept home physical therapy  Discharged Condition: stable  Hospital Course:   Patient was admitted to the medicine floor bed after presenting with nausea vomiting and diarrhea. He was found to have significant leukocytosis on admission also saline is in his right lower extremity. Patient was placed on bowel rest, given judicious IV fluids, started on antibiotics for cellulitis. There was rapid resolution of leukocytosis, he essentially remained afebrile. 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 It also showed dilatation of the small bowel. Consultations were made to GI and surgery. Transiently NG tube was placed, there was no significant output from the NG tube. Patient underwent colonoscopy and an attempt to decompress bowel. There appear to be significant stool present there was a limited decompression of his bowel. Unfortunately over the next ensuing days the patient's x-ray showed improvement in dilatation of his bowel. His NG tube was removed, MiraLax was added, clear liquids subsequently were added and he started having bowel movements on April 15. Patient's hospital course is complicated by back  pain, has a known history of chronic back pain. X-rays were obtained without acute pathology. His pain level did improve and no further evaluation was warranted during this hospitalization. Patient has completed his course of antibiotics for cellulitis of his right lower extremity. At this time patient is felt to be stable for discharge to home he will have outpatient followup of one week we will resume his outpatient medications, and slowly titrate his Lantus dose back to baseline as his diet was  Consults: Treatment Team:  Vertell Novak., MD  Significant Diagnostic Studies:Dg Lumbar Spine 2-3 Views  11/12/2011  *RADIOLOGY REPORT*  Clinical Data: Back pain.  LUMBAR SPINE - 2-3 VIEW  Comparison: Post-myelogram CT scan 08/14/2006.  Findings: Flowing ossification along the anterior aspect of the T12- S1 vertebral bodies is unchanged.  Posterior elements do not appear fused. Findings are consistent with diffuse idiopathic skeletal hyperostosis.  Vertebral body height and alignment maintained. Sacroiliac joints are not fused.  IMPRESSION: No acute finding.  Changes of DISH are again seen.  Original Report Authenticated By: Bernadene Bell. D'ALESSIO, M.D.   Dg Abd 2 Views  11/11/2011  *RADIOLOGY REPORT*  Clinical Data: Follow up ileus.  ABDOMEN - 2 VIEW 11/11/2011:  Comparison: Portable abdomen x-ray yesterday, 11/07/2011, and two- view abdomen x-ray 11/08/2011.  Findings: Bowel gas pattern now unremarkable, with resolution of the colonic and small bowel distention.  Moderate stool remains in the ascending colon and transverse colon.  No evidence of free air on the erect image.  No visible opaque urinary tract calculi.  IMPRESSION: Resolution of colonic and small bowel ileus.  No acute abdominal abnormality currently.  Original Report Authenticated By: Arnell Sieving, M.D.   Dg Abd 2 Views  11/08/2011  *RADIOLOGY REPORT*  Clinical Data: Abdominal pain and  distension, evaluate for small bowel obstruction   ABDOMEN - 2 VIEW  Comparison: 11/07/2011;  Findings: No significant change in marked gaseous distension of predominately the cecum, measuring approximately 13 cm in diameter. This finding is associated with mild to moderate gaseous distension of multiple loops of large and small bowel.  No definite pneumoperitoneum, pneumatosis or portal venous gas.  An enteric tube terminates over the left upper abdominal quadrant.  No acute osseous abnormalities.  Redemonstrated multilevel flowing osteophytosis with preservation of disc spaces. Mild degenerative change of the bilateral hips.  IMPRESSION: Grossly unchanged finding suggestive of ileus.  Original Report Authenticated By: Waynard Reeds, M.D.   Dg Abd Portable 1v  11/10/2011  *RADIOLOGY REPORT*  Clinical Data: Abdominal distention.  Follow up ileus and NG tube placement.  PORTABLE ABDOMEN - 1 VIEW  Comparison: 11/08/2011  Findings: NG tube is identified with tip overlying the proximal stomach. There has been significant decrease in gaseous distention of the colon. Nondistended gas-filled loops of small bowel are present. No other significant abnormalities identified.  IMPRESSION: Significant DECREASE in colonic distention.  NG tube within the proximal stomach.  Original Report Authenticated By: Rosendo Gros, M.D.   Dg Abd Portable 1v  11/07/2011  *RADIOLOGY REPORT*  Clinical Data: Abdominal distention and rigidity.  Diarrhea.  PORTABLE ABDOMEN - 1 VIEW  Comparison: None.  Findings: There is gaseous distention of small bowel and colon. Large amount of stool is seen in the cecum, which appears rather distended, measuring upwards of 13.6 cm.  IMPRESSION: Gaseous distention of small bowel and colon, with marked distention of the cecum.  Ileus is considered.  Colonic obstruction cannot be definitively excluded.  Original Report Authenticated By: Reyes Ivan, M.D.      Discharge Exam: Blood pressure 163/58, pulse 85, temperature 97.8 F (36.6 C),  temperature source Oral, resp. rate 18, height 5\' 9"  (1.753 m), weight 116.121 kg (256 lb), SpO2 97.00%. General appearance: alert and cooperative Resp: clear to auscultation bilaterally Cardio: regular rate and rhythm, S1, S2 normal, no murmur, click, rub or gallop GI: soft, non-tender; bowel sounds normal; no masses,  no organomegaly Extremities: 2+ edema right lower extremity, 1+ on the left  Disposition:    Medication List  As of 11/13/2011  8:44 AM   STOP taking these medications         predniSONE 10 MG tablet         TAKE these medications         aspirin 81 MG chewable tablet   Chew 81 mg by mouth daily.      furosemide 20 MG tablet   Commonly known as: LASIX   Take 20 mg by mouth 2 (two) times daily.      insulin glargine 100 UNIT/ML injection   Commonly known as: LANTUS   Inject 40 Units into the skin daily.      lisinopril-hydrochlorothiazide 10-12.5 MG per tablet   Commonly known as: PRINZIDE,ZESTORETIC   Take 1 tablet by mouth daily.      methocarbamol 750 MG tablet   Commonly known as: ROBAXIN   Take 750 mg by mouth every 8 (eight) hours as needed. For pain      omeprazole 20 MG capsule   Commonly known as: PRILOSEC   Take 20 mg by mouth daily.      polyethylene glycol packet   Commonly known as: MIRALAX / GLYCOLAX   Take 17 g by mouth 2 (two) times daily.  Follow-up Information    Follow up with Nickholas Goldston D, MD in 1 week.         SignedKaty Apo 11/13/2011, 8:44 AM

## 2011-11-13 NOTE — Progress Notes (Signed)
[facility] PROGRESS NOTE Subjective Much better. Tolerating diet Positive BM and flatus  Objective: Vital signs in last 24 hours: Temp:  [97.8 F (36.6 C)-98.6 F (37 C)] 97.8 F (36.6 C) (04/17 0515) Pulse Rate:  [66-85] 85  (04/17 0515) Resp:  [18-20] 18  (04/17 0515) BP: (163-175)/(48-58) 163/58 mmHg (04/17 0515) SpO2:  [97 %-99 %] 97 % (04/17 0515) Last BM Date: 11/12/11  Intake/Output from previous day: 04/16 0701 - 04/17 0700 In: 3829.7 [P.O.:1080; I.V.:703; IV Piggyback:155; TPN:1891.7] Out: 625 [Urine:625] Intake/Output this shift:    PE:Abd-slight distended, good BSs  Lab Results:  [facility] 11/11/11 0430  WBC 6.6  HGB 11.6*  HCT 36.0*  PLT 137*   BMET  Basename 11/12/11 0620 11/11/11 0430  NA 134* 134*  K 4.3 4.0  CL 106 106  CO2 24 22  CREATININE 1.08 1.08   LFT  Basename 11/11/11 0430  PROT 5.0*  AST 25  ALT 20  ALKPHOS 74  BILITOT 0.5  BILIDIR --  IBILI --   PT/INR No results found for this basename: LABPROT:3,INR:3 in the last 72 hours PANCREAS No results found for this basename: LIPASE:3 in the last 72 hours       Studies/Results: Dg Lumbar Spine 2-3 Views  11/12/2011  *RADIOLOGY REPORT*  Clinical Data: Back pain.  LUMBAR SPINE - 2-3 VIEW  Comparison: Post-myelogram CT scan 08/14/2006.  Findings: Flowing ossification along the anterior aspect of the T12- S1 vertebral bodies is unchanged.  Posterior elements do not appear fused. Findings are consistent with diffuse idiopathic skeletal hyperostosis.  Vertebral body height and alignment maintained. Sacroiliac joints are not fused.  IMPRESSION: No acute finding.  Changes of DISH are again seen.  Original Report Authenticated By: Bernadene Bell. Maricela Curet, M.D.    Medications: I have reviewed the patient's current medications.  Assessment/Plan: 1. Ileus.  Resolved, will advance diet please call for any further problems   Crystal Ellwood JR,Adonnis Salceda L 11/13/2011, 8:20 AM

## 2011-11-13 NOTE — Progress Notes (Signed)
CARE MANAGEMENT NOTE 11/13/2011  Patient:  Ronald Lindsey, Ronald Lindsey   Account Number:  [id number]  Date Initiated:  11/07/2011  Documentation initiated by:  Jiles Crocker  Subjective/Objective Assessment:   ADMITTED WITH GASTROENTERITIS     Action/Plan:   PCP IS DR POLITE; LIVES AT HOME   Anticipated DC Date:  11/14/2011   Anticipated DC Plan:  HOME W HOME HEALTH SERVICES      DC Planning Services  CM consult      [facility] Choice  HOME HEALTH   Choice offered to / List presented to:  C-1 Patient   DME arranged  [facility]  3-N-1      DME agency  [facility]     HH arranged  HH-2 PT      [facility] agency  [facility]   Status of service:  Completed, signed off    Discharge Disposition:  HOME W HOME HEALTH SERVICES  Per UR Regulation:  Reviewed for med. necessity/level of care/duration of stay    Comments:   11/13/11, Kathi Der RNC-MNN, BSN, [phone], CM received referral.  CM met with pt and wife to offer choice for [facility] services.  Pt and his wife have chosen [facility]. [facility] Care notified of order and confirmation of service received.  Information faxed to [phone] with confirmation.  Pt's wife will be able to assist at home as needed.  Lucretia at [facility] contacted with DME order and confirmation received.  11/07/2011- Abelino Derrick RN, BSN, MHA  HOME HEALTH AGENCIES SERVING [place]   Agencies that are Medicare-Certified and are affiliated with [facility]  Telephone Number Address  [facility]   [facility] has ownership interest in this company; however, you are under no obligation to use this agency. [phone] or  [phone] [address] [place], [place] [address]   Agencies that are Medicare-Certified and are not affiliated with [facility]  Telephone Number Address  [facility] [phone] Fax [phone] [address] [place], [place]  [address]  [facility] [phone] or [phone] Fax [phone] [address] [place], [place] [address]  [facility] [phone] Fax [phone] [address] [place], [place] [address]  [facility] [phone] Fax [phone] [address] [place], [place]  [address]  [facility] [phone] Fax [phone] [address] [place], [place] [address]  [facility] [phone] [address] [place], [place] [address]  [facility] [phone]  [address] [place], [place] [address]  [facility] [phone] or [phone] Fax [phone] [address] [place], [place]  [address]  [facility] [phone] Fax [phone] [address] [place], [place]  [address]  [facility]  [phone] Fax [phone] [address] [place], [place] [address]  Agencies that are not Medicare-Certified and are not affiliated with [facility] Telephone Number Address  [facility] [place] [phone] or [phone] Fax [phone] [address], [place]  [address]  [facility] [phone] Fax [phone] [address] [place], [place]  [address]  [facility]  [phone] Fax [phone] [address] [place], [place] [address]  [facility] [phone] Fax [phone] [address] [place], [place] [address]  [facility] [phone] or [phone] Fax [phone] [address] [place], [place]  [address]  [facility] [phone] or [phone] Fax  [phone] [address] [place], [place]  [address]  [facility] [phone] Fax [phone] [address] [place], [place]  [address]  [facility] [phone] [address] [place], [place]  [address]  [facility] [phone] Fax [phone] [address] [place], [place]  [address]  [facility] [phone] Fax [phone] [address] [place], [place]  [address]  [facility] [phone] Fax [phone] [address] [place], [place] [address]  [facility] [phone] Fax [phone] [address] [place], [place]  [address]

## 2015-01-19 ENCOUNTER — Ambulatory Visit (HOSPITAL_COMMUNITY): Payer: 59

## 2015-02-02 ENCOUNTER — Other Ambulatory Visit: Payer: Self-pay | Admitting: Internal Medicine

## 2015-02-02 ENCOUNTER — Ambulatory Visit
Admission: RE | Admit: 2015-02-02 | Discharge: 2015-02-02 | Disposition: A | Discharge status: Home / Self Care | Payer: Medicare Other | Source: Ambulatory Visit | Attending: Internal Medicine | Admitting: Internal Medicine

## 2015-02-02 DIAGNOSIS — R0989 Other specified symptoms and signs involving the circulatory and respiratory systems: Secondary | ICD-10-CM

## 2015-02-21 ENCOUNTER — Ambulatory Visit (HOSPITAL_COMMUNITY): Payer: Medicare Other | Attending: Cardiovascular Disease

## 2015-02-21 ENCOUNTER — Other Ambulatory Visit: Payer: Self-pay

## 2015-02-21 ENCOUNTER — Other Ambulatory Visit: Payer: Self-pay | Admitting: Internal Medicine

## 2015-02-21 DIAGNOSIS — I517 Cardiomegaly: Secondary | ICD-10-CM | POA: Diagnosis not present

## 2015-02-21 DIAGNOSIS — I509 Heart failure, unspecified: Secondary | ICD-10-CM | POA: Diagnosis not present

## 2015-02-21 MED ORDER — PERFLUTREN LIPID MICROSPHERE
1.0000 mL | Freq: Once | INTRAVENOUS | Status: AC
  Administered 2015-02-21: 1 mL via INTRAVENOUS

## 2015-03-02 ENCOUNTER — Other Ambulatory Visit: Payer: Self-pay | Admitting: Internal Medicine

## 2015-03-02 ENCOUNTER — Ambulatory Visit
Admission: RE | Admit: 2015-03-02 | Discharge: 2015-03-02 | Disposition: A | Discharge status: Home / Self Care | Payer: Medicare Other | Source: Ambulatory Visit | Attending: Internal Medicine | Admitting: Internal Medicine

## 2015-03-02 DIAGNOSIS — J90 Pleural effusion, not elsewhere classified: Secondary | ICD-10-CM

## 2015-04-10 ENCOUNTER — Other Ambulatory Visit: Payer: Self-pay | Admitting: Internal Medicine

## 2015-04-10 DIAGNOSIS — J90 Pleural effusion, not elsewhere classified: Secondary | ICD-10-CM

## 2015-04-11 ENCOUNTER — Ambulatory Visit
Admission: RE | Admit: 2015-04-11 | Discharge: 2015-04-11 | Disposition: A | Discharge status: Home / Self Care | Payer: Medicare Other | Source: Ambulatory Visit | Attending: Internal Medicine | Admitting: Internal Medicine

## 2015-04-11 ENCOUNTER — Other Ambulatory Visit (HOSPITAL_COMMUNITY): Payer: Self-pay | Admitting: Internal Medicine

## 2015-04-11 DIAGNOSIS — J90 Pleural effusion, not elsewhere classified: Secondary | ICD-10-CM

## 2015-04-13 ENCOUNTER — Ambulatory Visit (HOSPITAL_COMMUNITY)
Admission: RE | Admit: 2015-04-13 | Discharge: 2015-04-13 | Disposition: A | Discharge status: Home / Self Care | Payer: Medicare Other | Source: Ambulatory Visit | Attending: Radiology | Admitting: Radiology

## 2015-04-13 ENCOUNTER — Ambulatory Visit (HOSPITAL_COMMUNITY)
Admission: RE | Admit: 2015-04-13 | Discharge: 2015-04-13 | Disposition: A | Discharge status: Home / Self Care | Payer: Medicare Other | Source: Ambulatory Visit | Attending: Internal Medicine | Admitting: Internal Medicine

## 2015-04-13 DIAGNOSIS — Z9889 Other specified postprocedural states: Secondary | ICD-10-CM

## 2015-04-13 DIAGNOSIS — J90 Pleural effusion, not elsewhere classified: Secondary | ICD-10-CM | POA: Diagnosis not present

## 2015-04-13 DIAGNOSIS — J948 Other specified pleural conditions: Secondary | ICD-10-CM | POA: Diagnosis not present

## 2015-04-13 DIAGNOSIS — J939 Pneumothorax, unspecified: Secondary | ICD-10-CM

## 2015-04-13 NOTE — Procedures (Addendum)
Successful US guided left thoracentesis. Yielded 1.2 liters of serous fluid. Pt tolerated procedure well. No immediate complications.  Specimen was sent for labs. CXR ordered.  Pattricia Boss D PA-C 04/13/2015 11:27 AM   Patient with post procedure left hydropneumothorax approximately 10-15% on CXR, discussed with Dr. Deanne Coffer and the patient was monitored in radiology for 1 hour with repeat CXR and vitals signs. Repeat 1 hour CXR with stable left hydropneumothorax, vital signs and o2 stable. The patient denies any chest pain or shortness of breath. He was discharged home with instructions to call 911 or go to closest Emergency department if he develops chest pain or shortness of breath with concern for further collapse of lung, his family is with him today and all parties verbalize understanding of the above plan. He does live with his wife and will have someone with him tonight.   Pattricia Boss PA-C Interventional Radiology  04/13/15  1:29 PM

## 2015-04-25 ENCOUNTER — Ambulatory Visit
Admission: RE | Admit: 2015-04-25 | Discharge: 2015-04-25 | Disposition: A | Discharge status: Home / Self Care | Payer: Medicare Other | Source: Ambulatory Visit | Attending: Internal Medicine | Admitting: Internal Medicine

## 2015-04-25 ENCOUNTER — Other Ambulatory Visit: Payer: Self-pay | Admitting: Internal Medicine

## 2015-04-25 DIAGNOSIS — J939 Pneumothorax, unspecified: Secondary | ICD-10-CM

## 2015-04-25 DIAGNOSIS — J90 Pleural effusion, not elsewhere classified: Secondary | ICD-10-CM

## 2015-05-08 ENCOUNTER — Encounter: Payer: Self-pay | Admitting: Pulmonary Disease

## 2015-05-08 ENCOUNTER — Ambulatory Visit (INDEPENDENT_AMBULATORY_CARE_PROVIDER_SITE_OTHER): Payer: Medicare Other | Admitting: Pulmonary Disease

## 2015-05-08 VITALS — BP 132/82 | HR 58 | Ht 71.0 in | Wt 242.0 lb

## 2015-05-08 DIAGNOSIS — J90 Pleural effusion, not elsewhere classified: Secondary | ICD-10-CM | POA: Diagnosis not present

## 2015-05-08 HISTORY — DX: Pleural effusion, not elsewhere classified: J90

## 2015-05-08 NOTE — Progress Notes (Signed)
Subjective:    Patient ID: Ronald Lindsey, male    DOB: [birth year], 81 y.o.   MRN: [id number]  HPI Referral for evaluation of pleural effusion.  Mr. Stockman is a 81 year old with past medical history as below. His primary care physician noted a large left pleural effusion last month. He underwent IR thoracentesis on 04/13/15 with removal of 1.2 L of serous pleural fluid. Postop he was noted to have a small left hydropneumothorax. This has resolved spontaneously without any intervention. However a repeat chest x-ray on 04/25/15 shows reaccumulation of fluid. The effusion now appears slightly larger than the pre- thoracentesis effusion. Cytology of the pleural fluid shows reactive mesothelial cells. There is no evidence of malignancy. No other labs from the fluid was sent last month.  Mr. Sida is asymptomatic. He's never had any chills or fevers, sputum production, dyspnea, , hemoptysis. He denies any fatigue, loss of weight or appetite. He appears to be up-to-date with his normal cancer screenings. His other issues over the past month have been lower extremity edema, diastolic dysfunction. He is on Lasix for that.  Social history: He is a never smoker. No alcohol use. No recreational drug use.  Past Medical History  Diagnosis Date  . Diabetes mellitus   . Diabetic retinopathy   . Macular degeneration   . Diabetic neuropathy (HCC)   . HTN (hypertension)   . GERD (gastroesophageal reflux disease)   . Back pain   . Previous back surgery 11/06/2011  . S/P appendectomy 11/06/2011    Current outpatient prescriptions:  .  aspirin 81 MG chewable tablet, Chew 81 mg by mouth daily., Disp: , Rfl:  .  furosemide (LASIX) 20 MG tablet, Take 20 mg by mouth 2 (two) times daily., Disp: , Rfl:  .  insulin glargine (LANTUS) 100 UNIT/ML injection, Inject 40 Units into the skin daily., Disp: 10 mL, Rfl:  .  lisinopril-hydrochlorothiazide (PRINZIDE,ZESTORETIC) 10-12.5 MG per tablet, Take 1 tablet by mouth  daily., Disp: , Rfl:    Review of Systems  Denies any cough, shortness of breath, sputum production, fevers, chills, hemoptysis. Denies any fatigue, loss of weight loss of appetite, malaise. Denies any chest pain, palpitations. Denies any nausea, vomiting, diarrhea. All other review of systems are negative.  Blood pressure 132/82, pulse 58, height  (1.803 m), weight 242 lb (109.77 kg), SpO2 98 %.    Objective:   Physical Exam Gen.: No apparent distress Neuro: No gross focal deficits. Neck: No JVD, lymphadenopathy, thyromegaly. RS: Reduced breath sounds over the lt hemithorax with dullness to percussion. CVS: S1-S2 heard, no murmurs rubs gallops. Abdomen: Soft, positive bowel sounds. Extremities: No edema.    Assessment & Plan:  Recurrent left pleural effusion.  The etiology of this effusion is unclear as most of the labs on the fluid have not been sent. However the cytology report does not show any malignant cells. The chronicity of the effusion is unclear as his previous x-ray before this was in 2005. I suspect the effusion is inflammatiory, exudative in nature and had been present for a while. The small pneumothorax postprocedure appears to be an pneumothorax ex vacuo and he may have a trapped lung.  I discussed the possible etiologies including an undiagnosed pneumonia with parapneumonic effusion, malignancy. I advised that we need to repeat the thoracentesis and send the appropriate labs. He will need a CT scan post procedure to visualize the lung once the effusion has been drained. He is reluctant to get the procedure since  he feels well and does not believe that the fluid has come back. I showed him and his wife the chest x-ray showing re accumulation of fluid. I also discussed this with Dr. Nehemiah Settle, his primary care physician who agreed that he will need a repeat thoracentesis. Dr. Nehemiah Settle will call the patient and try to convince him to get this done. I will schedule him for an  thoracentesis next week.  Plan: - Repeat thoracentesis - CT scan of chest post procedure.  Chilton Greathouse MD [facility] Pager [phone] If no answer or after 3pm call: [phone] 05/08/2015, 1:08 PM

## 2015-05-11 NOTE — Addendum Note (Signed)
Addended by: Boone MasterJONES, JESSICA E on: 05/11/2015 12:13 PM   Modules accepted: Orders

## 2015-05-15 ENCOUNTER — Encounter (HOSPITAL_COMMUNITY): Payer: Medicare Other

## 2015-05-15 ENCOUNTER — Ambulatory Visit (HOSPITAL_COMMUNITY): Admission: RE | Admit: 2015-05-15 | Payer: Medicare Other | Source: Ambulatory Visit | Admitting: Pulmonary Disease

## 2015-05-15 ENCOUNTER — Encounter (HOSPITAL_COMMUNITY): Admission: RE | Payer: Self-pay | Source: Ambulatory Visit

## 2015-05-15 SURGERY — THORACENTESIS
Anesthesia: LOCAL | Laterality: Left

## 2015-05-18 ENCOUNTER — Other Ambulatory Visit (HOSPITAL_COMMUNITY): Payer: Self-pay | Admitting: Internal Medicine

## 2015-05-18 DIAGNOSIS — J90 Pleural effusion, not elsewhere classified: Secondary | ICD-10-CM

## 2015-05-19 ENCOUNTER — Other Ambulatory Visit: Payer: Self-pay | Admitting: Internal Medicine

## 2015-05-19 DIAGNOSIS — J90 Pleural effusion, not elsewhere classified: Secondary | ICD-10-CM

## 2015-05-24 ENCOUNTER — Ambulatory Visit (HOSPITAL_COMMUNITY)
Admission: RE | Admit: 2015-05-24 | Discharge: 2015-05-24 | Disposition: A | Discharge status: Home / Self Care | Payer: Medicare Other | Source: Ambulatory Visit | Attending: Radiology | Admitting: Radiology

## 2015-05-24 ENCOUNTER — Ambulatory Visit (HOSPITAL_COMMUNITY)
Admission: RE | Admit: 2015-05-24 | Discharge: 2015-05-24 | Disposition: A | Discharge status: Home / Self Care | Payer: Medicare Other | Source: Ambulatory Visit | Attending: Internal Medicine | Admitting: Internal Medicine

## 2015-05-24 DIAGNOSIS — J948 Other specified pleural conditions: Secondary | ICD-10-CM | POA: Diagnosis not present

## 2015-05-24 DIAGNOSIS — J90 Pleural effusion, not elsewhere classified: Secondary | ICD-10-CM | POA: Insufficient documentation

## 2015-05-24 DIAGNOSIS — Z9889 Other specified postprocedural states: Secondary | ICD-10-CM

## 2015-05-24 LAB — BODY FLUID CELL COUNT WITH DIFFERENTIAL
Eos, Fluid: 11 %
Lymphs, Fluid: 66 %
Monocyte-Macrophage-Serous Fluid: 22 % — ABNORMAL LOW (ref 50–90)
Neutrophil Count, Fluid: 1 % (ref 0–25)
Total Nucleated Cell Count, Fluid: 716 cu mm (ref 0–1000)

## 2015-05-24 LAB — GRAM STAIN

## 2015-05-24 LAB — PROTEIN, BODY FLUID: TOTAL PROTEIN, FLUID: 4.4 g/dL

## 2015-05-24 NOTE — Procedures (Signed)
US guided diagnostic/therapeutic left thoracentesis performed yielding 1.2 liters yellow fluid. A portion of the fluid was sent to the lab for preordered studies. F/u CXR pending. No immediate complications.

## 2015-05-26 ENCOUNTER — Ambulatory Visit
Admission: RE | Admit: 2015-05-26 | Discharge: 2015-05-26 | Disposition: A | Discharge status: Home / Self Care | Payer: Medicare Other | Source: Ambulatory Visit | Attending: Internal Medicine | Admitting: Internal Medicine

## 2015-05-26 DIAGNOSIS — J90 Pleural effusion, not elsewhere classified: Secondary | ICD-10-CM

## 2015-05-29 LAB — LACTATE DEHYDROGENASE, PLEURAL OR PERITONEAL FLUID: LD, Fluid: 95 U/L — ABNORMAL HIGH (ref 3–23)

## 2015-05-29 LAB — CULTURE, BODY FLUID W GRAM STAIN -BOTTLE: Culture: NO GROWTH

## 2015-05-29 LAB — CULTURE, BODY FLUID-BOTTLE

## 2015-05-31 LAB — PH, BODY FLUID: PH, BODY FLUID: 7.7

## 2015-06-12 ENCOUNTER — Other Ambulatory Visit: Payer: Self-pay | Admitting: Internal Medicine

## 2015-06-12 DIAGNOSIS — R091 Pleurisy: Secondary | ICD-10-CM

## 2015-06-12 DIAGNOSIS — R140 Abdominal distension (gaseous): Secondary | ICD-10-CM

## 2015-06-15 ENCOUNTER — Ambulatory Visit
Admission: RE | Admit: 2015-06-15 | Discharge: 2015-06-15 | Disposition: A | Discharge status: Home / Self Care | Payer: Medicare Other | Source: Ambulatory Visit | Attending: Internal Medicine | Admitting: Internal Medicine

## 2015-06-15 ENCOUNTER — Other Ambulatory Visit: Payer: Medicare Other

## 2015-06-15 DIAGNOSIS — R091 Pleurisy: Secondary | ICD-10-CM

## 2015-06-15 DIAGNOSIS — R140 Abdominal distension (gaseous): Secondary | ICD-10-CM

## 2017-03-18 ENCOUNTER — Other Ambulatory Visit: Payer: Self-pay | Admitting: Internal Medicine

## 2017-03-18 ENCOUNTER — Ambulatory Visit
Admission: RE | Admit: 2017-03-18 | Discharge: 2017-03-18 | Disposition: A | Discharge status: Home / Self Care | Payer: Medicare Other | Source: Ambulatory Visit | Attending: Internal Medicine | Admitting: Internal Medicine

## 2017-03-18 DIAGNOSIS — J918 Pleural effusion in other conditions classified elsewhere: Secondary | ICD-10-CM

## 2017-03-18 DIAGNOSIS — M545 Low back pain: Secondary | ICD-10-CM

## 2017-09-13 ENCOUNTER — Other Ambulatory Visit: Payer: Self-pay

## 2017-09-13 ENCOUNTER — Emergency Department (HOSPITAL_COMMUNITY): Payer: Medicare Other

## 2017-09-13 ENCOUNTER — Encounter (HOSPITAL_COMMUNITY): Payer: Self-pay

## 2017-09-13 ENCOUNTER — Inpatient Hospital Stay (HOSPITAL_COMMUNITY)
Admission: EM | Admit: 2017-09-13 | Discharge: 2017-09-23 | DRG: 291 | Disposition: A | Discharge status: Skilled Nursing Facility | Payer: Medicare Other | Attending: Internal Medicine | Admitting: Internal Medicine

## 2017-09-13 DIAGNOSIS — I5031 Acute diastolic (congestive) heart failure: Secondary | ICD-10-CM | POA: Diagnosis not present

## 2017-09-13 DIAGNOSIS — I130 Hypertensive heart and chronic kidney disease with heart failure and stage 1 through stage 4 chronic kidney disease, or unspecified chronic kidney disease: Secondary | ICD-10-CM | POA: Diagnosis present

## 2017-09-13 DIAGNOSIS — N184 Chronic kidney disease, stage 4 (severe): Secondary | ICD-10-CM | POA: Diagnosis not present

## 2017-09-13 DIAGNOSIS — I452 Bifascicular block: Secondary | ICD-10-CM | POA: Diagnosis present

## 2017-09-13 DIAGNOSIS — R778 Other specified abnormalities of plasma proteins: Secondary | ICD-10-CM

## 2017-09-13 DIAGNOSIS — R001 Bradycardia, unspecified: Secondary | ICD-10-CM | POA: Diagnosis present

## 2017-09-13 DIAGNOSIS — Z794 Long term (current) use of insulin: Secondary | ICD-10-CM

## 2017-09-13 DIAGNOSIS — L97929 Non-pressure chronic ulcer of unspecified part of left lower leg with unspecified severity: Secondary | ICD-10-CM | POA: Diagnosis present

## 2017-09-13 DIAGNOSIS — I5033 Acute on chronic diastolic (congestive) heart failure: Secondary | ICD-10-CM | POA: Diagnosis present

## 2017-09-13 DIAGNOSIS — R609 Edema, unspecified: Secondary | ICD-10-CM | POA: Diagnosis not present

## 2017-09-13 DIAGNOSIS — H3530 Unspecified macular degeneration: Secondary | ICD-10-CM | POA: Diagnosis present

## 2017-09-13 DIAGNOSIS — I872 Venous insufficiency (chronic) (peripheral): Secondary | ICD-10-CM | POA: Diagnosis present

## 2017-09-13 DIAGNOSIS — N19 Unspecified kidney failure: Secondary | ICD-10-CM | POA: Diagnosis present

## 2017-09-13 DIAGNOSIS — E1122 Type 2 diabetes mellitus with diabetic chronic kidney disease: Secondary | ICD-10-CM | POA: Diagnosis present

## 2017-09-13 DIAGNOSIS — I441 Atrioventricular block, second degree: Secondary | ICD-10-CM | POA: Diagnosis present

## 2017-09-13 DIAGNOSIS — N183 Chronic kidney disease, stage 3 (moderate): Secondary | ICD-10-CM | POA: Diagnosis present

## 2017-09-13 DIAGNOSIS — R600 Localized edema: Secondary | ICD-10-CM | POA: Diagnosis present

## 2017-09-13 DIAGNOSIS — I480 Paroxysmal atrial fibrillation: Secondary | ICD-10-CM

## 2017-09-13 DIAGNOSIS — N179 Acute kidney failure, unspecified: Secondary | ICD-10-CM | POA: Diagnosis not present

## 2017-09-13 DIAGNOSIS — R7989 Other specified abnormal findings of blood chemistry: Secondary | ICD-10-CM

## 2017-09-13 DIAGNOSIS — K219 Gastro-esophageal reflux disease without esophagitis: Secondary | ICD-10-CM | POA: Diagnosis present

## 2017-09-13 DIAGNOSIS — E11319 Type 2 diabetes mellitus with unspecified diabetic retinopathy without macular edema: Secondary | ICD-10-CM | POA: Diagnosis present

## 2017-09-13 DIAGNOSIS — I340 Nonrheumatic mitral (valve) insufficiency: Secondary | ICD-10-CM | POA: Diagnosis not present

## 2017-09-13 DIAGNOSIS — E119 Type 2 diabetes mellitus without complications: Secondary | ICD-10-CM

## 2017-09-13 DIAGNOSIS — E11649 Type 2 diabetes mellitus with hypoglycemia without coma: Secondary | ICD-10-CM | POA: Diagnosis not present

## 2017-09-13 DIAGNOSIS — I10 Essential (primary) hypertension: Secondary | ICD-10-CM | POA: Diagnosis present

## 2017-09-13 DIAGNOSIS — L97919 Non-pressure chronic ulcer of unspecified part of right lower leg with unspecified severity: Secondary | ICD-10-CM | POA: Diagnosis present

## 2017-09-13 DIAGNOSIS — Z7982 Long term (current) use of aspirin: Secondary | ICD-10-CM

## 2017-09-13 DIAGNOSIS — I4891 Unspecified atrial fibrillation: Secondary | ICD-10-CM | POA: Diagnosis present

## 2017-09-13 DIAGNOSIS — Z79899 Other long term (current) drug therapy: Secondary | ICD-10-CM | POA: Diagnosis not present

## 2017-09-13 DIAGNOSIS — E1140 Type 2 diabetes mellitus with diabetic neuropathy, unspecified: Secondary | ICD-10-CM | POA: Diagnosis present

## 2017-09-13 DIAGNOSIS — R0602 Shortness of breath: Secondary | ICD-10-CM

## 2017-09-13 DIAGNOSIS — I509 Heart failure, unspecified: Secondary | ICD-10-CM | POA: Diagnosis not present

## 2017-09-13 HISTORY — DX: Atrioventricular block, second degree: I44.1

## 2017-09-13 HISTORY — DX: Heart failure, unspecified: I50.9

## 2017-09-13 HISTORY — DX: Acute diastolic (congestive) heart failure: I50.31

## 2017-09-13 LAB — BRAIN NATRIURETIC PEPTIDE: B Natriuretic Peptide: 518.8 pg/mL — ABNORMAL HIGH (ref 0.0–100.0)

## 2017-09-13 LAB — COMPREHENSIVE METABOLIC PANEL
ALBUMIN: 2.9 g/dL — AB (ref 3.5–5.0)
ALT: 34 U/L (ref 17–63)
ANION GAP: 8 (ref 5–15)
AST: 64 U/L — AB (ref 15–41)
Alkaline Phosphatase: 243 U/L — ABNORMAL HIGH (ref 38–126)
BUN: 35 mg/dL — AB (ref 6–20)
CHLORIDE: 106 mmol/L (ref 101–111)
CO2: 27 mmol/L (ref 22–32)
Calcium: 8.8 mg/dL — ABNORMAL LOW (ref 8.9–10.3)
Creatinine, Ser: 1.85 mg/dL — ABNORMAL HIGH (ref 0.61–1.24)
GFR calc Af Amer: 35 mL/min — ABNORMAL LOW (ref >60)
GFR, EST NON AFRICAN AMERICAN: 30 mL/min — AB (ref >60)
Glucose, Bld: 139 mg/dL — ABNORMAL HIGH (ref 65–99)
POTASSIUM: 4.6 mmol/L (ref 3.5–5.1)
Sodium: 141 mmol/L (ref 135–145)
Total Bilirubin: 0.9 mg/dL (ref 0.3–1.2)
Total Protein: 6.7 g/dL (ref 6.5–8.1)

## 2017-09-13 LAB — GLUCOSE, CAPILLARY: GLUCOSE-CAPILLARY: 89 mg/dL (ref 65–99)

## 2017-09-13 LAB — CBC WITH DIFFERENTIAL/PLATELET
Basophils Absolute: 0 10*3/uL (ref 0.0–0.1)
Basophils Relative: 0 %
EOS PCT: 2 %
Eosinophils Absolute: 0.2 10*3/uL (ref 0.0–0.7)
HEMATOCRIT: 40.9 % (ref 39.0–52.0)
HEMOGLOBIN: 13.6 g/dL (ref 13.0–17.0)
LYMPHS ABS: 3.8 10*3/uL (ref 0.7–4.0)
LYMPHS PCT: 42 %
MCH: 34.3 pg — AB (ref 26.0–34.0)
MCHC: 33.3 g/dL (ref 30.0–36.0)
MCV: 103.3 fL — AB (ref 78.0–100.0)
Monocytes Absolute: 0.6 10*3/uL (ref 0.1–1.0)
Monocytes Relative: 7 %
NEUTROS ABS: 4.4 10*3/uL (ref 1.7–7.7)
NEUTROS PCT: 49 %
Platelets: 156 10*3/uL (ref 150–400)
RBC: 3.96 MIL/uL — AB (ref 4.22–5.81)
RDW: 15.1 % (ref 11.5–15.5)
WBC: 9 10*3/uL (ref 4.0–10.5)

## 2017-09-13 LAB — TROPONIN I: Troponin I: 0.04 ng/mL (ref <0.03)

## 2017-09-13 MED ORDER — ASPIRIN EC 81 MG PO TBEC
81.0000 mg | DELAYED_RELEASE_TABLET | Freq: Every day | ORAL | Status: DC
  Administered 2017-09-14 – 2017-09-23 (×10): 81 mg via ORAL
  Filled 2017-09-13 (×10): qty 1

## 2017-09-13 MED ORDER — FUROSEMIDE 40 MG PO TABS
80.0000 mg | ORAL_TABLET | Freq: Two times a day (BID) | ORAL | Status: DC
  Administered 2017-09-13: 80 mg via ORAL
  Filled 2017-09-13 (×2): qty 2

## 2017-09-13 MED ORDER — ALBUTEROL SULFATE (2.5 MG/3ML) 0.083% IN NEBU
5.0000 mg | INHALATION_SOLUTION | Freq: Once | RESPIRATORY_TRACT | Status: AC
  Administered 2017-09-13: 5 mg via RESPIRATORY_TRACT
  Filled 2017-09-13: qty 6

## 2017-09-13 MED ORDER — TRAZODONE HCL 50 MG PO TABS
50.0000 mg | ORAL_TABLET | Freq: Once | ORAL | Status: DC
  Filled 2017-09-13: qty 1

## 2017-09-13 MED ORDER — CARVEDILOL 3.125 MG PO TABS
3.1250 mg | ORAL_TABLET | Freq: Two times a day (BID) | ORAL | Status: DC
  Administered 2017-09-14 – 2017-09-16 (×3): 3.125 mg via ORAL
  Filled 2017-09-13 (×4): qty 1

## 2017-09-13 MED ORDER — FUROSEMIDE 10 MG/ML IJ SOLN
40.0000 mg | Freq: Two times a day (BID) | INTRAMUSCULAR | Status: DC
  Administered 2017-09-14 – 2017-09-20 (×14): 40 mg via INTRAVENOUS
  Filled 2017-09-13 (×15): qty 4

## 2017-09-13 MED ORDER — ACETAMINOPHEN 325 MG PO TABS: 650.0000 mg | ORAL_TABLET | ORAL | Status: DC | PRN

## 2017-09-13 MED ORDER — FUROSEMIDE 10 MG/ML IJ SOLN
60.0000 mg | Freq: Once | INTRAMUSCULAR | Status: AC
  Administered 2017-09-13: 60 mg via INTRAVENOUS
  Filled 2017-09-13: qty 8

## 2017-09-13 MED ORDER — SODIUM CHLORIDE 0.9 % IV SOLN: 250.0000 mL | INTRAVENOUS | Status: DC | PRN

## 2017-09-13 MED ORDER — SODIUM CHLORIDE 0.9% FLUSH: 3.0000 mL | INTRAVENOUS | Status: DC | PRN

## 2017-09-13 MED ORDER — INSULIN GLARGINE 100 UNIT/ML ~~LOC~~ SOLN
35.0000 [IU] | Freq: Two times a day (BID) | SUBCUTANEOUS | Status: DC
  Administered 2017-09-14 (×2): 35 [IU] via SUBCUTANEOUS
  Filled 2017-09-13 (×4): qty 0.35

## 2017-09-13 MED ORDER — LISINOPRIL 10 MG PO TABS
10.0000 mg | ORAL_TABLET | Freq: Every day | ORAL | Status: DC
  Administered 2017-09-16 – 2017-09-18 (×3): 10 mg via ORAL
  Filled 2017-09-13 (×5): qty 1

## 2017-09-13 MED ORDER — PROSIGHT PO TABS
1.0000 | ORAL_TABLET | Freq: Two times a day (BID) | ORAL | Status: DC
  Administered 2017-09-13 – 2017-09-23 (×20): 1 via ORAL
  Filled 2017-09-13 (×20): qty 1

## 2017-09-13 MED ORDER — SODIUM CHLORIDE 0.9% FLUSH
3.0000 mL | Freq: Two times a day (BID) | INTRAVENOUS | Status: DC
  Administered 2017-09-13 – 2017-09-23 (×19): 3 mL via INTRAVENOUS

## 2017-09-13 MED ORDER — HEPARIN SODIUM (PORCINE) 5000 UNIT/ML IJ SOLN
5000.0000 [IU] | Freq: Three times a day (TID) | INTRAMUSCULAR | Status: DC
  Administered 2017-09-13 – 2017-09-23 (×29): 5000 [IU] via SUBCUTANEOUS
  Filled 2017-09-13 (×28): qty 1

## 2017-09-13 MED ORDER — ONDANSETRON HCL 4 MG/2ML IJ SOLN: 4.0000 mg | Freq: Four times a day (QID) | INTRAMUSCULAR | Status: DC | PRN

## 2017-09-13 NOTE — ED Triage Notes (Signed)
Patient coming from home with c/o SOB for about 1 week. Pt has hx of CHF and all ext swollen. Patient state swelling to lower ext is normal for him but swelling to the abdomen is not. Pt is afib currently but no hx of afib in past.

## 2017-09-13 NOTE — ED Notes (Signed)
ED TO INPATIENT HANDOFF REPORT  Name/Age/Gender Ronald Lindsey 84 y.o. male  Code Status Code Status History    This patient does not have a recorded code status. Please follow your organizational policy for patients in this situation.    Advance Directive Documentation     Most Recent Value  Type of Advance Directive  Healthcare Power of Attorney, Living will  Pre-existing out of facility DNR order (yellow form or pink MOST form)  No data  "MOST" Form in Place?  No data      Home/SNF/Other Home  Chief Complaint breathing difficulties   Level of Care/Admitting Diagnosis ED Disposition    ED Disposition Condition Comment   Admit  Hospital Area: [place] [[id number]]  Level of Care: Telemetry [5]  Admit to tele based on following criteria: Acute CHF  Diagnosis: CHF (congestive heart failure) ([place]) [947096]  Admitting Physician: Elwyn Reach [[id number]]  Attending Physician: Elwyn Reach [[id number]]  Estimated length of stay: past midnight tomorrow  Certification:: I certify this patient will need inpatient services for at least 2 midnights  PT Class (Do Not Modify): Inpatient [101]  PT Acc Code (Do Not Modify): Private [1]       Medical History Past Medical History:  Diagnosis Date  . Back pain   . Diabetes mellitus   . Diabetic neuropathy ([place])   . Diabetic retinopathy   . GERD (gastroesophageal reflux disease)   . HTN (hypertension)   . Macular degeneration   . Previous back surgery 11/06/2011  . S/P appendectomy 11/06/2011    Allergies No Known Allergies  IV Location/Drains/Wounds Patient Lines/Drains/Airways Status   Active Line/Drains/Airways    Name:   Placement date:   Placement time:   Site:   Days:   Peripheral IV 09/13/17 Left Hand   09/13/17    -    Hand   less than 1          Labs/Imaging Results for orders placed or performed during the hospital encounter of 09/13/17 (from the past 48 hour(s))  Comprehensive metabolic  panel     Status: Abnormal   Collection Time: 09/13/17  4:22 PM  Result Value Ref Range   Sodium 141 135 - 145 mmol/L   Potassium 4.6 3.5 - 5.1 mmol/L   Chloride 106 101 - 111 mmol/L   CO2 27 22 - 32 mmol/L   Glucose, Bld 139 (H) 65 - 99 mg/dL   BUN 35 (H) 6 - 20 mg/dL   Creatinine, Ser 1.85 (H) 0.61 - 1.24 mg/dL   Calcium 8.8 (L) 8.9 - 10.3 mg/dL   Total Protein 6.7 6.5 - 8.1 g/dL   Albumin 2.9 (L) 3.5 - 5.0 g/dL   AST 64 (H) 15 - 41 U/L   ALT 34 17 - 63 U/L   Alkaline Phosphatase 243 (H) 38 - 126 U/L   Total Bilirubin 0.9 0.3 - 1.2 mg/dL   GFR calc non Af Amer 30 (L) >60 mL/min   GFR calc Af Amer 35 (L) >60 mL/min    Comment: (NOTE) The eGFR has been calculated using the CKD EPI equation. This calculation has not been validated in all clinical situations. eGFR's persistently <60 mL/min signify possible Chronic Kidney Disease.    Anion gap 8 5 - 15    Comment: Performed at [facility], [place] [address] [place], [place] [address]  CBC WITH DIFFERENTIAL     Status: Abnormal   Collection Time: 09/13/17  4:22  PM  Result Value Ref Range   WBC 9.0 4.0 - 10.5 K/uL   RBC 3.96 (L) 4.22 - 5.81 MIL/uL   Hemoglobin 13.6 13.0 - 17.0 g/dL   HCT 40.9 39.0 - 52.0 %   MCV 103.3 (H) 78.0 - 100.0 fL   MCH 34.3 (H) 26.0 - 34.0 pg   MCHC 33.3 30.0 - 36.0 g/dL   RDW 15.1 11.5 - 15.5 %   Platelets 156 150 - 400 K/uL   Neutrophils Relative % 49 %   Neutro Abs 4.4 1.7 - 7.7 K/uL   Lymphocytes Relative 42 %   Lymphs Abs 3.8 0.7 - 4.0 K/uL   Monocytes Relative 7 %   Monocytes Absolute 0.6 0.1 - 1.0 K/uL   Eosinophils Relative 2 %   Eosinophils Absolute 0.2 0.0 - 0.7 K/uL   Basophils Relative 0 %   Basophils Absolute 0.0 0.0 - 0.1 K/uL    Comment: Performed at [facility], [place] [address] [place], [place] [address]  Troponin I (MHP)     Status: Abnormal   Collection Time: 09/13/17  4:22 PM  Result Value Ref Range   Troponin I 0.04 (HH) <0.03 ng/mL     Comment: CRITICAL RESULT CALLED TO, READ BACK BY AND VERIFIED WITH: Q.MILLNER,RN 09/13/17 '@1802'$  BY V.WILKINS Performed at [facility], [place] [address], [place], [place] [address]   Brain natriuretic peptide     Status: Abnormal   Collection Time: 09/13/17  4:22 PM  Result Value Ref Range   B Natriuretic Peptide 518.8 (H) 0.0 - 100.0 pg/mL    Comment: Performed at [facility], [place] [address], [place], [place] [address]   Dg Chest 2 View  Result Date: 09/13/2017 CLINICAL DATA:  Shortness of breath. EXAM: CHEST  2 VIEW COMPARISON:  Chest x-ray dated March 18, 2017. FINDINGS: Mild cardiomegaly and central vascular congestion. Atherosclerotic calcification of the aortic arch. Low lung volumes. Small bilateral pleural effusions with bibasilar atelectasis. No pneumothorax. No acute osseous abnormality. IMPRESSION: Pulmonary vascular congestion and small bilateral pleural effusions. Electronically Signed   By: Titus Dubin M.D.   On: 09/13/2017 15:39    Pending Labs [employer] (From admission, onward)   Start     Ordered   Signed and Corporate treasurer  Daily,   R     Signed and Held   Signed and Held  CBC  (heparin)  Once,   R    Comments:  Baseline for heparin therapy IF NOT ALREADY DRAWN.  Notify MD if PLT < 100 K.    Signed and Held   Signed and Held  Creatinine, serum  (heparin)  Once,   R    Comments:  Baseline for heparin therapy IF NOT ALREADY DRAWN.    Signed and Held   Signed and Held  TSH  Once,   R     Signed and Held   Signed and Held  Magnesium  Once,   R     Signed and Held   Signed and Held  Troponin I  Now then every 6 hours,   R     Signed and Held      Vitals/Pain Today's Vitals   09/13/17 2045 09/13/17 2100 09/13/17 2130 09/13/17 2200  BP:  (!) 186/60 (!) 164/51 (!) 171/63  Pulse: 65 63 60 (!) 58  Resp: (!) 29 (!) 25 (!) 22 (!) 22  Temp:      TempSrc:  SpO2: 96% 93% 95% 96%  Weight:      Height:         Isolation Precautions No active isolations  Medications Medications  albuterol (PROVENTIL) (2.5 MG/3ML) 0.083% nebulizer solution 5 mg (5 mg Nebulization Given 09/13/17 1552)  furosemide (LASIX) injection 60 mg (60 mg Intravenous Given 09/13/17 1933)    Mobility walks with person assist

## 2017-09-13 NOTE — ED Notes (Signed)
Bed: [id number] Expected date:  Expected time:  Means of arrival:  Comments: EMS 89-y-o shob/a-fib

## 2017-09-13 NOTE — H&P (Signed)
History and Physical    URHO RIO ZOX:[id number] DOB: [birth year] DOA: 09/13/2017  Referring MD/NP/PA: Dr. Jeraldine Loots  PCP: Renford Dills, MD   Outpatient Specialists: None  Patient coming from: Home  Chief Complaint: Shortness of breath and leg swelling  HPI: Ronald Lindsey is a 84 y.o. male with medical history significant of diabetes with diabetic retinopathy and hypertension who presents to the ER with shortness of breath. Patient started having this exertional dyspnea over the last 1 week. There is associated declining functional capacity unable to walk or do what he normally does. Also had PND and orthopnea. Patient last echocardiogram was in 2016. He has history of diabetes mainly with hypertension and GERD but no previous cardiac diagnosis. Patient was also noted today to have new onset atrial fibrillation. Rate is however control. Cardiology has been consulted in the ER and will see patient in the hospital.  ED Course: Patient given IV Lasix with chest x-ray ordered and cardiology consulted. EKG ordered showing atrial fibrillation. BUN creatinine is elevated with carinal 1.83  Review of Systems: As per HPI otherwise 10 point review of systems negative.    Past Medical History:  Diagnosis Date  . Back pain   . Diabetes mellitus   . Diabetic neuropathy (HCC)   . Diabetic retinopathy   . GERD (gastroesophageal reflux disease)   . HTN (hypertension)   . Macular degeneration   . Previous back surgery 11/06/2011  . S/P appendectomy 11/06/2011    Past Surgical History:  Procedure Laterality Date  . BRAIN SURGERY    . CATARACT EXTRACTION     bilateral eyes  . COLONOSCOPY  11/08/2011   Procedure: COLONOSCOPY;  Surgeon: Graylin Shiver, MD;  Location: [facility];  Service: Endoscopy;  Laterality: N/A;  with decompression  . IMPLANTABLE CONTACT LENS IMPLANTATION     RT eye successful, LT eye is difficult to see - pt states     reports that  has never smoked. he has never used  smokeless tobacco. He reports that he does not drink alcohol or use drugs.  No Known Allergies  No family history on file.   Prior to Admission medications   Medication Sig Start Date End Date Taking? Authorizing Provider  aspirin EC 81 MG tablet Take 81 mg by mouth daily.   Yes [provider]  furosemide (LASIX) 40 MG tablet Take 80 mg by mouth 2 (two) times daily. 07/02/17  Yes [provider]  LANTUS SOLOSTAR 100 UNIT/ML Solostar Pen Inject 35 Units into the skin 2 (two) times daily. 09/12/17  Yes [provider]  lisinopril (PRINIVIL,ZESTRIL) 10 MG tablet Take 10 mg by mouth daily. 08/18/17  Yes [provider]  Multiple Vitamins-Minerals (PRESERVISION AREDS 2 PO) Take 1 tablet by mouth 2 (two) times daily.   Yes [provider]    Physical Exam: Vitals:   09/13/17 1845 09/13/17 1900 09/13/17 1915 09/13/17 1930  BP:  (!) 172/47  (!) 176/60  Pulse: (!) 58 (!) 57 (!) 58 (!) 56  Resp: (!) 28 (!) 24 (!) 23 (!) 23  Temp:      TempSrc:      SpO2: 98% 96% 95% 96%  Weight:      Height:          Constitutional: NAD, calm, comfortable Vitals:   09/13/17 1845 09/13/17 1900 09/13/17 1915 09/13/17 1930  BP:  (!) 172/47  (!) 176/60  Pulse: (!) 58 (!) 57 (!) 58 (!) 56  Resp: Marland Kitchen)  28 (!) 24 (!) 23 (!) 23  Temp:      TempSrc:      SpO2: 98% 96% 95% 96%  Weight:      Height:       Eyes: PERRL, lids and conjunctivae normal ENMT: Mucous membranes are moist. Posterior pharynx clear of any exudate or lesions.Normal dentition.  Neck: normal, supple, no masses, no thyromegaly Respiratory: Decreased air entry with bilateral basal crackles. Normal respiratory effort. No accessory muscle use.  Cardiovascular: Irregular rate and rhythm, no murmurs / rubs / gallops. 2+ extremity edema. 2+ pedal pulses. No carotid bruits.  Abdomen: no tenderness, no masses palpated. No hepatosplenomegaly. Bowel sounds positive.  Musculoskeletal: no clubbing / cyanosis.  No joint deformity upper and lower extremities. Good ROM, no contractures. Normal muscle tone.  Skin: no rashes, lesions, ulcers. No induration Neurologic: CN 2-12 grossly intact. Sensation intact, DTR normal. Strength 5/5 in all 4.  Psychiatric: Normal judgment and insight. Alert and oriented x 3. Normal mood.    Labs on Admission: I have personally reviewed following labs and imaging studies  CBC: Recent Labs  Lab 09/13/17 1622  WBC 9.0  NEUTROABS 4.4  HGB 13.6  HCT 40.9  MCV 103.3*  PLT 156   Basic Metabolic Panel: Recent Labs  Lab 09/13/17 1622  NA 141  K 4.6  CL 106  CO2 27  GLUCOSE 139*  BUN 35*  CREATININE 1.85*  CALCIUM 8.8*   GFR: Estimated Creatinine Clearance: 34 mL/min (A) (by C-G formula based on SCr of 1.85 mg/dL (H)). Liver Function Tests: Recent Labs  Lab 09/13/17 1622  AST 64*  ALT 34  ALKPHOS 243*  BILITOT 0.9  PROT 6.7  ALBUMIN 2.9*   No results for input(s): LIPASE, AMYLASE in the last 168 hours. No results for input(s): AMMONIA in the last 168 hours. Coagulation Profile: No results for input(s): INR, PROTIME in the last 168 hours. Cardiac Enzymes: Recent Labs  Lab 09/13/17 1622  TROPONINI 0.04*   BNP (last 3 results) No results for input(s): PROBNP in the last 8760 hours. HbA1C: No results for input(s): HGBA1C in the last 72 hours. CBG: No results for input(s): GLUCAP in the last 168 hours. Lipid Profile: No results for input(s): CHOL, HDL, LDLCALC, TRIG, CHOLHDL, LDLDIRECT in the last 72 hours. Thyroid Function Tests: No results for input(s): TSH, T4TOTAL, FREET4, T3FREE, THYROIDAB in the last 72 hours. Anemia Panel: No results for input(s): VITAMINB12, FOLATE, FERRITIN, TIBC, IRON, RETICCTPCT in the last 72 hours. Urine analysis:    Component Value Date/Time   COLORURINE YELLOW 11/07/2011 1631   APPEARANCEUR CLEAR 11/07/2011 1631   LABSPEC 1.022 11/07/2011 1631   PHURINE 6.0 11/07/2011 1631   GLUCOSEU 250 (A) 11/07/2011  1631   HGBUR TRACE (A) 11/07/2011 1631   BILIRUBINUR NEGATIVE 11/07/2011 1631   KETONESUR NEGATIVE 11/07/2011 1631   PROTEINUR 30 (A) 11/07/2011 1631   UROBILINOGEN 0.2 11/07/2011 1631   NITRITE NEGATIVE 11/07/2011 1631   LEUKOCYTESUR NEGATIVE 11/07/2011 1631   Sepsis Labs: @LABRCNTIP (procalcitonin:4,lacticidven:4) )No results found for this or any previous visit (from the past 240 hour(s)).   Radiological Exams on Admission: Dg Chest 2 View  Result Date: 09/13/2017 CLINICAL DATA:  Shortness of breath. EXAM: CHEST  2 VIEW COMPARISON:  Chest x-ray dated March 18, 2017. FINDINGS: Mild cardiomegaly and central vascular congestion. Atherosclerotic calcification of the aortic arch. Low lung volumes. Small bilateral pleural effusions with bibasilar atelectasis. No pneumothorax. No acute osseous abnormality. IMPRESSION: Pulmonary vascular congestion and small  bilateral pleural effusions. Electronically Signed   By: Obie DredgeWilliam T Derry M.D.   On: 09/13/2017 15:39    EKG: Independently reviewed.   Assessment/Plan Principal Problem:   CHF (congestive heart failure), NYHA class II (HCC) Active Problems:   HTN (hypertension)   GERD (gastroesophageal reflux disease)   DM (diabetes mellitus) (HCC)   CHF (congestive heart failure) (HCC)    #1 congestive heart failure: Patient's BNP is 518 and has evidence of fluid overload. Most likely diastolic dysfunction. We will get echocardiogram to evaluate his EF. Start beta blocker and Lasix. No ACE inhibitor due to kidney disease. Watch renal function especially with the Lasix. Appreciate consult to cardiology.  #2 renal failure: Most likely chronic kidney disease secondary to diabetes. Last creatinine in 2013 was 1.08. Will obtain lab results from physician's office. Monitor renal function  #3 diabetes: Continue patient's home insulin regimen with sliding scale insulin.  #4 hypertension: Continue blood pressure medications from home.  #5 GERD: Continue  with PPIs   DVT prophylaxis: Heparin  Code Status: Full   Family Communication: No family around  Disposition Plan: To be determined   Consults called: Cardiology contacted by the ER.  Admission status: Inpatient   Severity of Illness: The appropriate patient status for this patient is INPATIENT. Inpatient status is judged to be reasonable and necessary in order to provide the required intensity of service to ensure the patient's safety. [name] presenting symptoms, physical exam findings, and initial radiographic and laboratory data in the context of their chronic comorbidities is felt to place them at high risk for further clinical deterioration. Furthermore, it is not anticipated that the patient will be medically stable for discharge from the hospital within 2 midnights of admission. The following factors support the patient status of inpatient.   " [name] presenting symptoms include shortness of breath and lower eczema T edema. " The worrisome physical exam findings include bilateral lower extremity edema with chronic stasis dermatitis. " The initial radiographic and laboratory data are worrisome because of evidence of pulmonary edema " The chronic co-morbidities include hypertension and known dysfunction.  .   * I certify that at the point of admission it is my clinical judgment that the patient will require inpatient hospital care spanning beyond 2 midnights from the point of admission due to high intensity of service, high risk for further deterioration and high frequency of surveillance required.Lonia Blood*    GARBA,LAWAL MD [facility] Pager [phone]  If 7PM-7AM, please contact night-coverage [email or web address] Password [email or web address]  09/13/2017, 9:02 PM

## 2017-09-13 NOTE — ED Provider Notes (Signed)
[facility]-EMERGENCY DEPT Provider Note   CSN: [id number] Arrival date & time: 09/13/17  1434     History   Chief Complaint No chief complaint on file.   HPI Ronald Lindsey is a 84 y.o. male.  HPI Patient presents with concern of dyspnea, no pain. Onset was about 1 week ago, now, over the past week patient has had progressive decline in functional capacity, decreased ability to walk, raise, both at rest and walking. There is supine difficulty as well. No new fever, pain, there is mild cough. No change in medication, diet, activity. He is here with his wife and caregiver who assist with the HPI.    Past Medical History:  Diagnosis Date  . Back pain   . Diabetes mellitus   . Diabetic neuropathy (HCC)   . Diabetic retinopathy   . GERD (gastroesophageal reflux disease)   . HTN (hypertension)   . Macular degeneration   . Previous back surgery 11/06/2011  . S/P appendectomy 11/06/2011    Patient Active Problem List   Diagnosis Date Noted  . Pleural effusion 05/08/2015  . Gastroenteritis 11/06/2011  . Renal failure 11/06/2011  . DM (diabetes mellitus) (HCC) 11/06/2011  . Previous back surgery 11/06/2011  . S/P appendectomy 11/06/2011  . Cellulitis of right leg 11/06/2011  . Diabetic retinopathy   . Macular degeneration   . Diabetic neuropathy (HCC)   . HTN (hypertension)   . GERD (gastroesophageal reflux disease)   . Back pain     Past Surgical History:  Procedure Laterality Date  . BRAIN SURGERY    . CATARACT EXTRACTION     bilateral eyes  . COLONOSCOPY  11/08/2011   Procedure: COLONOSCOPY;  Surgeon: Graylin Shiver, MD;  Location: [facility];  Service: Endoscopy;  Laterality: N/A;  with decompression  . IMPLANTABLE CONTACT LENS IMPLANTATION     RT eye successful, LT eye is difficult to see - pt states       Home Medications    Prior to Admission medications   Medication Sig Start Date End Date Taking? Authorizing Provider  aspirin  EC 81 MG tablet Take 81 mg by mouth daily.   Yes [provider]  furosemide (LASIX) 40 MG tablet Take 80 mg by mouth 2 (two) times daily. 07/02/17  Yes [provider]  LANTUS SOLOSTAR 100 UNIT/ML Solostar Pen Inject 35 Units into the skin 2 (two) times daily. 09/12/17  Yes [provider]  lisinopril (PRINIVIL,ZESTRIL) 10 MG tablet Take 10 mg by mouth daily. 08/18/17  Yes [provider]  Multiple Vitamins-Minerals (PRESERVISION AREDS 2 PO) Take 1 tablet by mouth 2 (two) times daily.   Yes [provider]    Family History No family history on file.  Social History Social History   Tobacco Use  . Smoking status: Never Smoker  . Smokeless tobacco: Never Used  Substance Use Topics  . Alcohol use: No  . Drug use: No     Allergies   Patient has no known allergies.   Review of Systems Review of Systems  Constitutional:       Per HPI, otherwise negative  HENT:       Per HPI, otherwise negative  Respiratory:       Per HPI, otherwise negative  Cardiovascular:       Per HPI, otherwise negative  Gastrointestinal: Negative for vomiting.  Endocrine:       Negative aside from HPI  Genitourinary:  Neg aside from HPI   Musculoskeletal:       Per HPI, otherwise negative  Skin: Negative.   Neurological: Negative for syncope.     Physical Exam Updated Vital Signs BP (!) 176/60   Pulse (!) 56   Temp 97.8 F (36.6 C) (Oral)   Resp (!) 23   Ht 5\' 11"  (1.803 m)   Wt 113.4 kg (250 lb)   SpO2 96%   BMI 34.87 kg/m   Physical Exam  Constitutional: He is oriented to person, place, and time. He appears well-developed. No distress.  HENT:  Head: Normocephalic and atraumatic.  Eyes: Conjunctivae and EOM are normal.  Cardiovascular: Normal rate and regular rhythm.  Pulmonary/Chest: He has decreased breath sounds.  Abdominal: He exhibits no distension.  Musculoskeletal: He exhibits no edema.  Neurological: He is alert and oriented  to person, place, and time.  Skin: Skin is warm and dry.  Substantial bilateral lower extremity edema, pitting, 2+  Psychiatric: He has a normal mood and affect.  Nursing note and vitals reviewed.    ED Treatments / Results  Labs (all labs ordered are listed, but only abnormal results are displayed) Labs Reviewed  COMPREHENSIVE METABOLIC PANEL - Abnormal; Notable for the following components:      Result Value   Glucose, Bld 139 (*)    BUN 35 (*)    Creatinine, Ser 1.85 (*)    Calcium 8.8 (*)    Albumin 2.9 (*)    AST 64 (*)    Alkaline Phosphatase 243 (*)    GFR calc non Af Amer 30 (*)    GFR calc Af Amer 35 (*)    All other components within normal limits  CBC WITH DIFFERENTIAL/PLATELET - Abnormal; Notable for the following components:   RBC 3.96 (*)    MCV 103.3 (*)    MCH 34.3 (*)    All other components within normal limits  TROPONIN I - Abnormal; Notable for the following components:   Troponin I 0.04 (*)    All other components within normal limits  BRAIN NATRIURETIC PEPTIDE - Abnormal; Notable for the following components:   B Natriuretic Peptide 518.8 (*)    All other components within normal limits    EKG  EKG Interpretation  Date/Time:  Saturday September 13 2017 20:20:54 EST Ventricular Rate:  58 PR Interval:    QRS Duration: 144 QT Interval:  496 QTC Calculation: 488 R Axis:   -79 Text Interpretation:  Atrial fibrillation Right bundle branch block Artifact Abnormal ekg Confirmed by Gerhard Munch [phone]) on 09/13/2017 8:25:59 PM       Radiology Dg Chest 2 View  Result Date: 09/13/2017 CLINICAL DATA:  Shortness of breath. EXAM: CHEST  2 VIEW COMPARISON:  Chest x-ray dated March 18, 2017. FINDINGS: Mild cardiomegaly and central vascular congestion. Atherosclerotic calcification of the aortic arch. Low lung volumes. Small bilateral pleural effusions with bibasilar atelectasis. No pneumothorax. No acute osseous abnormality. IMPRESSION: Pulmonary  vascular congestion and small bilateral pleural effusions. Electronically Signed   By: Obie Dredge M.D.   On: 09/13/2017 15:39    Procedures Procedures (including critical care time)  Medications Ordered in ED Medications  albuterol (PROVENTIL) (2.5 MG/3ML) 0.083% nebulizer solution 5 mg (5 mg Nebulization Given 09/13/17 1552)  furosemide (LASIX) injection 60 mg (60 mg Intravenous Given 09/13/17 1933)       Reviewed echocardiogram, 2016, as below  Left ventricle:  Mid and basal inferior wall hypokinesis. The cavity size was mildly dilated.  Wall thickness was increased in a pattern of mild LVH. Systolic function was normal. The estimated ejection fraction was in the range of 50% to 55%. Doppler parameters are consistent with abnormal left ventricular relaxation (grade 1 diastolic dysfunction).   Initial Impression / Assessment and Plan / ED Course  I have reviewed the triage vital signs and the nursing notes.  Pertinent labs & imaging results that were available during my care of the patient were reviewed by me and considered in my medical decision making (see chart for details).     Update: Patient in similar condition from arrival, awake and alert, no pain.   Update: Patient aware of all findings, including concern for heart failure exacerbation. Patient does have a trivial elevation of troponin, and I discussed the patient's presentation with her cardiology team.  1 patient has no cardiologist himself.   8:27 PM Patient has received Lasix, continues to deny pain.  On he remains tachypneic.  On given concern for heart failure exacerbation, elevated troponin, worsening renal function, and persistent tachypnea, increased work of breathing, he will require admission for further evaluation and management.   Final Clinical Impressions(s) / ED Diagnoses  Heart failure exacerbation   Gerhard MunchLockwood, Daria Mcmeekin, MD 09/13/17 2028

## 2017-09-14 ENCOUNTER — Inpatient Hospital Stay (HOSPITAL_COMMUNITY): Payer: Medicare Other

## 2017-09-14 DIAGNOSIS — I509 Heart failure, unspecified: Secondary | ICD-10-CM

## 2017-09-14 DIAGNOSIS — I340 Nonrheumatic mitral (valve) insufficiency: Secondary | ICD-10-CM

## 2017-09-14 LAB — TSH: TSH: 3.039 u[IU]/mL (ref 0.350–4.500)

## 2017-09-14 LAB — BASIC METABOLIC PANEL
ANION GAP: 6 (ref 5–15)
BUN: 38 mg/dL — AB (ref 6–20)
CALCIUM: 8.6 mg/dL — AB (ref 8.9–10.3)
CO2: 27 mmol/L (ref 22–32)
Chloride: 108 mmol/L (ref 101–111)
Creatinine, Ser: 1.95 mg/dL — ABNORMAL HIGH (ref 0.61–1.24)
GFR calc Af Amer: 33 mL/min — ABNORMAL LOW (ref >60)
GFR calc non Af Amer: 29 mL/min — ABNORMAL LOW (ref >60)
GLUCOSE: 77 mg/dL (ref 65–99)
Potassium: 4.6 mmol/L (ref 3.5–5.1)
Sodium: 141 mmol/L (ref 135–145)

## 2017-09-14 LAB — MAGNESIUM: Magnesium: 2.1 mg/dL (ref 1.7–2.4)

## 2017-09-14 LAB — GLUCOSE, CAPILLARY
GLUCOSE-CAPILLARY: 147 mg/dL — AB (ref 65–99)
Glucose-Capillary: 134 mg/dL — ABNORMAL HIGH (ref 65–99)

## 2017-09-14 LAB — TROPONIN I
TROPONIN I: 0.05 ng/mL — AB (ref <0.03)
TROPONIN I: 0.06 ng/mL — AB (ref <0.03)
Troponin I: 0.06 ng/mL (ref <0.03)

## 2017-09-14 LAB — ECHOCARDIOGRAM COMPLETE
HEIGHTINCHES: 69.5 in
WEIGHTICAEL: 4097.6 [oz_av]

## 2017-09-14 NOTE — Progress Notes (Signed)
  Echocardiogram 2D Echocardiogram has been performed.  [name] T [name] 09/14/2017, 11:10 AM

## 2017-09-14 NOTE — Progress Notes (Signed)
PROGRESS NOTE    Ronald Lindsey  ZOX:[id number] DOB: [birth year] DOA: 09/13/2017 PCP: Renford DillsPolite, Ronald, MD    Brief Narrative:  84 y.o. male with medical history significant of diabetes with diabetic retinopathy and hypertension who presents to the ER with shortness of breath. Patient started having this exertional dyspnea over the last 1 week. There is associated declining functional capacity unable to walk or do what he normally does.   Currently being treated for heart failure. Most likely diastolic dysfunction. Echocardiogram pending   Assessment & Plan:   Principal Problem:   CHF (congestive heart failure), NYHA class II (HCC) - Patient has elevated BNP of 518 in the emergency department - Cardiology consulted by ER physician -Continue current diuretics unless otherwise indicated by cardiology - Chest x-ray reports pulmonary vascular congestion  Active Problems:   HTN (hypertension) - Pt on lisitnopril, coreg  Bradycardia - hold coreg if HR 60 or less    GERD (gastroesophageal reflux disease) - stable currently    Renal failure - stable, should serum creatinine trend up will have to consider discontinuing    DM (diabetes mellitus) (HCC) - Pt on Lantus   DVT prophylaxis: Heparin Code Status: Full Family Communication: none at bedside Disposition Plan:    Consultants:   Per H and P ER physician consulted cardiology   Procedures: echocardiogram pending   Antimicrobials: none   Subjective: Pt has no new complaints currently  Objective: Vitals:   09/14/17 0602 09/14/17 0814 09/14/17 0815 09/14/17 1104  BP: (!) 144/44 (!) 144/40 (!) 138/40 (!) 140/42  Pulse: (!) 54 (!) 51 (!) 50   Resp: 20     Temp: 98.5 F (36.9 C)     TempSrc: Oral     SpO2: 94%  96%   Weight: 116.2 kg (256 lb 1.6 oz)     Height:        Intake/Output Summary (Last 24 hours) at 09/14/2017 1348 Last data filed at 09/14/2017 0953 Gross per 24 hour  Intake 240 ml  Output 500 ml    Net -260 ml   Filed Weights   09/13/17 1512 09/13/17 2248 09/14/17 0602  Weight: 113.4 kg (250 lb) 117.2 kg (258 lb 6.1 oz) 116.2 kg (256 lb 1.6 oz)    Examination:  General exam: Appears calm and comfortable  Respiratory system: Clear to auscultation. Respiratory effort normal. Cardiovascular system: S1 & S2 heard, RRR. No murmurs, rubs, gallops or clicks. + pedal edema. Gastrointestinal system: Abdomen is nondistended, soft and nontender. No organomegaly or masses felt. Normal bowel sounds heard. Central nervous system: Alert and oriented. No focal neurological deficits. Extremities: Symmetric 5 x 5 power. Skin: No rashes, lesions or ulcers Psychiatry: Judgement and insight appear normal. Mood & affect appropriate.     Data Reviewed: I have personally reviewed following labs and imaging studies  CBC: Recent Labs  Lab 09/13/17 1622  WBC 9.0  NEUTROABS 4.4  HGB 13.6  HCT 40.9  MCV 103.3*  PLT 156   Basic Metabolic Panel: Recent Labs  Lab 09/13/17 1622 09/13/17 2303 09/14/17 0446  NA 141  --  141  K 4.6  --  4.6  CL 106  --  108  CO2 27  --  27  GLUCOSE 139*  --  77  BUN 35*  --  38*  CREATININE 1.85*  --  1.95*  CALCIUM 8.8*  --  8.6*  MG  --  2.1  --    GFR: Estimated Creatinine Clearance: 31.9 mL/min (  A) (by C-G formula based on SCr of 1.95 mg/dL (H)). Liver Function Tests: Recent Labs  Lab 09/13/17 1622  AST 64*  ALT 34  ALKPHOS 243*  BILITOT 0.9  PROT 6.7  ALBUMIN 2.9*   No results for input(s): LIPASE, AMYLASE in the last 168 hours. No results for input(s): AMMONIA in the last 168 hours. Coagulation Profile: No results for input(s): INR, PROTIME in the last 168 hours. Cardiac Enzymes: Recent Labs  Lab 09/13/17 1622 09/13/17 2303 09/14/17 0446 09/14/17 1110  TROPONINI 0.04* 0.05* 0.06* 0.06*   BNP (last 3 results) No results for input(s): PROBNP in the last 8760 hours. HbA1C: No results for input(s): HGBA1C in the last 72  hours. CBG: Recent Labs  Lab 09/13/17 2308 09/14/17 1132  GLUCAP 89 134*   Lipid Profile: No results for input(s): CHOL, HDL, LDLCALC, TRIG, CHOLHDL, LDLDIRECT in the last 72 hours. Thyroid Function Tests: Recent Labs    09/13/17 2303  TSH 3.039   Anemia Panel: No results for input(s): VITAMINB12, FOLATE, FERRITIN, TIBC, IRON, RETICCTPCT in the last 72 hours. Sepsis Labs: No results for input(s): PROCALCITON, LATICACIDVEN in the last 168 hours.  No results found for this or any previous visit (from the past 240 hour(s)).       Radiology Studies: Dg Chest 2 View  Result Date: 09/13/2017 CLINICAL DATA:  Shortness of breath. EXAM: CHEST  2 VIEW COMPARISON:  Chest x-ray dated March 18, 2017. FINDINGS: Mild cardiomegaly and central vascular congestion. Atherosclerotic calcification of the aortic arch. Low lung volumes. Small bilateral pleural effusions with bibasilar atelectasis. No pneumothorax. No acute osseous abnormality. IMPRESSION: Pulmonary vascular congestion and small bilateral pleural effusions. Electronically Signed   By: Obie Dredge M.D.   On: 09/13/2017 15:39        Scheduled Meds: . aspirin EC  81 mg Oral Daily  . carvedilol  3.125 mg Oral BID WC  . furosemide  40 mg Intravenous BID  . heparin  5,000 Units Subcutaneous Q8H  . insulin glargine  35 Units Subcutaneous BID  . lisinopril  10 mg Oral Daily  . multivitamin  1 tablet Oral BID  . sodium chloride flush  3 mL Intravenous Q12H  . traZODone  50 mg Oral Once   Continuous Infusions: . sodium chloride       LOS: 1 day    Time spent: > 35 minutes  Penny Pia, MD [facility] Pager [phone]  If 7PM-7AM, please contact night-coverage [email or web address] Password [email or web address] 09/14/2017, 1:48 PM

## 2017-09-15 LAB — BASIC METABOLIC PANEL
ANION GAP: 8 (ref 5–15)
BUN: 44 mg/dL — ABNORMAL HIGH (ref 6–20)
CO2: 26 mmol/L (ref 22–32)
Calcium: 8.7 mg/dL — ABNORMAL LOW (ref 8.9–10.3)
Chloride: 106 mmol/L (ref 101–111)
Creatinine, Ser: 2.03 mg/dL — ABNORMAL HIGH (ref 0.61–1.24)
GFR calc Af Amer: 32 mL/min — ABNORMAL LOW (ref >60)
GFR, EST NON AFRICAN AMERICAN: 27 mL/min — AB (ref >60)
Glucose, Bld: 66 mg/dL (ref 65–99)
POTASSIUM: 4.1 mmol/L (ref 3.5–5.1)
SODIUM: 140 mmol/L (ref 135–145)

## 2017-09-15 LAB — GLUCOSE, CAPILLARY
GLUCOSE-CAPILLARY: 46 mg/dL — AB (ref 65–99)
GLUCOSE-CAPILLARY: 116 mg/dL — AB (ref 65–99)
Glucose-Capillary: 66 mg/dL (ref 65–99)
Glucose-Capillary: 121 mg/dL — ABNORMAL HIGH (ref 65–99)
Glucose-Capillary: 134 mg/dL — ABNORMAL HIGH (ref 65–99)

## 2017-09-15 MED ORDER — DIPHENHYDRAMINE HCL 12.5 MG/5ML PO ELIX
12.5000 mg | ORAL_SOLUTION | Freq: Every evening | ORAL | Status: DC | PRN
  Administered 2017-09-15 – 2017-09-16 (×2): 12.5 mg via ORAL
  Filled 2017-09-15 (×2): qty 5

## 2017-09-15 MED ORDER — INSULIN ASPART 100 UNIT/ML ~~LOC~~ SOLN
0.0000 [IU] | Freq: Three times a day (TID) | SUBCUTANEOUS | Status: DC
  Administered 2017-09-15 – 2017-09-16 (×2): 1 [IU] via SUBCUTANEOUS
  Administered 2017-09-16 – 2017-09-17 (×2): 2 [IU] via SUBCUTANEOUS
  Administered 2017-09-17 – 2017-09-18 (×2): 1 [IU] via SUBCUTANEOUS
  Administered 2017-09-19 (×2): 3 [IU] via SUBCUTANEOUS
  Administered 2017-09-20: 1 [IU] via SUBCUTANEOUS
  Administered 2017-09-20: 3 [IU] via SUBCUTANEOUS
  Administered 2017-09-20: 1 [IU] via SUBCUTANEOUS
  Administered 2017-09-21: 2 [IU] via SUBCUTANEOUS
  Administered 2017-09-21 (×2): 1 [IU] via SUBCUTANEOUS
  Administered 2017-09-22: 2 [IU] via SUBCUTANEOUS
  Administered 2017-09-22: 1 [IU] via SUBCUTANEOUS
  Administered 2017-09-23: 2 [IU] via SUBCUTANEOUS

## 2017-09-15 MED ORDER — INSULIN GLARGINE 100 UNIT/ML ~~LOC~~ SOLN
15.0000 [IU] | Freq: Two times a day (BID) | SUBCUTANEOUS | Status: DC
  Administered 2017-09-15: 15 [IU] via SUBCUTANEOUS
  Filled 2017-09-15 (×2): qty 0.15

## 2017-09-15 NOTE — Progress Notes (Signed)
Inpatient Diabetes Program Recommendations  AACE/ADA: New Consensus Statement on Inpatient Glycemic Control (2015)  Target Ranges:  Prepandial:   less than 140 mg/dL      Peak postprandial:   less than 180 mg/dL (1-2 hours)      Critically ill patients:  140 - 180 mg/dL   Lab Results  Component Value Date   GLUCAP 66 09/15/2017    Review of Glycemic ControlResults for Ronald MaroonBOWMAN, Ronald D (MRN [id number]) as of 09/15/2017 10:01  Ref. Range 09/13/2017 23:08 09/14/2017 11:32 09/14/2017 21:00 09/15/2017 07:45 09/15/2017 08:08  Glucose-Capillary Latest Ref Range: 65 - 99 mg/dL 89 409134 (H) 811147 (H) 46 (L) 66   Diabetes history: Type 2 DM Outpatient Diabetes medications: Lantus 35 units bid Current orders for Inpatient glycemic control:  Lantus 35 units bid Inpatient Diabetes Program Recommendations:   Please reduce Lantus to 15 units bid and add Novolog sensitive correction tid with meals and HS.  Text page sent to MD.   Thanks,  Ronald MeagerJenny April Colter, RN, BC-ADM Inpatient Diabetes Coordinator Pager [phone] (8a-5p)

## 2017-09-15 NOTE — Progress Notes (Signed)
PROGRESS NOTE    Ronald Lindsey  UJW:[id number] DOB: [birth year] DOA: 09/13/2017 PCP: Renford DillsPolite, Ronald, MD    Brief Narrative:  84 y.o. male with medical history significant of diabetes with diabetic retinopathy and hypertension who presents to the ER with shortness of breath. Patient started having this exertional dyspnea over the last 1 week. There is associated declining functional capacity unable to walk or do what he normally does.   Currently being treated for heart failure. Most likely diastolic dysfunction.    Assessment & Plan:   Principal Problem:   CHF (congestive heart failure), NYHA class II (HCC) - Patient had a elevated BNP of 518 in the ED -Diuresing well on Lasix 40 mg IV BID, down 2 Kg when reviewing weight.  - Chest x-ray reports pulmonary vascular congestion  Active Problems:   HTN (hypertension) - continue coreg, lisitnopril  Bradycardia - hold coreg if HR 60 or less    GERD (gastroesophageal reflux disease) - stable currently    Renal failure - stable, should serum creatinine trend up will have to consider discontinuing    DM (diabetes mellitus) (HCC) - Pt had hypoglycemic event overnight. Transition to lower dose lantus as per diabetic coordinator recommendations. Placed on SSI - continue diabetic diet.    DVT prophylaxis: Heparin Code Status: Full Family Communication: none at bedside Disposition Plan: home with continued diuresis.    Consultants:   Per H and P ER physician consulted cardiology   Procedures: echocardiogram: reporting EF of 65-70%    Antimicrobials: none   Subjective: Pt still complaining of sob with activity but improving.  Objective: Vitals:   09/14/17 1404 09/14/17 1759 09/14/17 2053 09/15/17 0428  BP: (!) 157/52 (!) 150/49 (!) 119/40 (!) 143/49  Pulse: (!) 56 (!) 59 (!) 108 (!) 47  Resp: 20 16 18 18   Temp: 98.2 F (36.8 C)  98.2 F (36.8 C) 98.1 F (36.7 C)  TempSrc: Oral  Oral Oral  SpO2: 96%  95% 94%    Weight:    114.9 kg (253 lb 3.2 oz)  Height:        Intake/Output Summary (Last 24 hours) at 09/15/2017 1009 Last data filed at 09/15/2017 0908 Gross per 24 hour  Intake 600 ml  Output 1450 ml  Net -850 ml   Filed Weights   09/13/17 2248 09/14/17 0602 09/15/17 0428  Weight: 117.2 kg (258 lb 6.1 oz) 116.2 kg (256 lb 1.6 oz) 114.9 kg (253 lb 3.2 oz)    Examination:  General exam: Appears calm and comfortable, pt in nad.  Respiratory system: decreased breath sounds at bases, no wheezes, equal chest rise. Cardiovascular system: S1 & S2 heard, RRR. No murmurs, rubs, gallops or clicks. + pedal edema. Gastrointestinal system: Abdomen is nondistended, soft and nontender. No organomegaly or masses felt. Normal bowel sounds heard. Central nervous system: Alert and oriented. No focal neurological deficits. Extremities: Symmetric 5 x 5 power. Skin: No rashes, lesions or ulcers, on limited exam. Psychiatry:  Mood & affect appropriate.   Data Reviewed: I have personally reviewed following labs and imaging studies  CBC: Recent Labs  Lab 09/13/17 1622  WBC 9.0  NEUTROABS 4.4  HGB 13.6  HCT 40.9  MCV 103.3*  PLT 156   Basic Metabolic Panel: Recent Labs  Lab 09/13/17 1622 09/13/17 2303 09/14/17 0446 09/15/17 0535  NA 141  --  141 140  K 4.6  --  4.6 4.1  CL 106  --  108 106  CO2 27  --  27 26  GLUCOSE 139*  --  77 66  BUN 35*  --  38* 44*  CREATININE 1.85*  --  1.95* 2.03*  CALCIUM 8.8*  --  8.6* 8.7*  MG  --  2.1  --   --    GFR: Estimated Creatinine Clearance: 30.5 mL/min (A) (by C-G formula based on SCr of 2.03 mg/dL (H)). Liver Function Tests: Recent Labs  Lab 09/13/17 1622  AST 64*  ALT 34  ALKPHOS 243*  BILITOT 0.9  PROT 6.7  ALBUMIN 2.9*   No results for input(s): LIPASE, AMYLASE in the last 168 hours. No results for input(s): AMMONIA in the last 168 hours. Coagulation Profile: No results for input(s): INR, PROTIME in the last 168 hours. Cardiac  Enzymes: Recent Labs  Lab 09/13/17 1622 09/13/17 2303 09/14/17 0446 09/14/17 1110  TROPONINI 0.04* 0.05* 0.06* 0.06*   BNP (last 3 results) No results for input(s): PROBNP in the last 8760 hours. HbA1C: No results for input(s): HGBA1C in the last 72 hours. CBG: Recent Labs  Lab 09/13/17 2308 09/14/17 1132 09/14/17 2100 09/15/17 0745 09/15/17 0808  GLUCAP 89 134* 147* 46* 66   Lipid Profile: No results for input(s): CHOL, HDL, LDLCALC, TRIG, CHOLHDL, LDLDIRECT in the last 72 hours. Thyroid Function Tests: Recent Labs    09/13/17 2303  TSH 3.039   Anemia Panel: No results for input(s): VITAMINB12, FOLATE, FERRITIN, TIBC, IRON, RETICCTPCT in the last 72 hours. Sepsis Labs: No results for input(s): PROCALCITON, LATICACIDVEN in the last 168 hours.  No results found for this or any previous visit (from the past 240 hour(s)).       Radiology Studies: Dg Chest 2 View  Result Date: 09/13/2017 CLINICAL DATA:  Shortness of breath. EXAM: CHEST  2 VIEW COMPARISON:  Chest x-ray dated March 18, 2017. FINDINGS: Mild cardiomegaly and central vascular congestion. Atherosclerotic calcification of the aortic arch. Low lung volumes. Small bilateral pleural effusions with bibasilar atelectasis. No pneumothorax. No acute osseous abnormality. IMPRESSION: Pulmonary vascular congestion and small bilateral pleural effusions. Electronically Signed   By: Obie Dredge M.D.   On: 09/13/2017 15:39        Scheduled Meds: . aspirin EC  81 mg Oral Daily  . carvedilol  3.125 mg Oral BID WC  . furosemide  40 mg Intravenous BID  . heparin  5,000 Units Subcutaneous Q8H  . insulin aspart  0-9 Units Subcutaneous TID WC  . insulin glargine  15 Units Subcutaneous BID  . lisinopril  10 mg Oral Daily  . multivitamin  1 tablet Oral BID  . sodium chloride flush  3 mL Intravenous Q12H  . traZODone  50 mg Oral Once   Continuous Infusions: . sodium chloride       LOS: 2 days    Time spent: >  35 minutes  Penny Pia, MD [facility] Pager [phone]  If 7PM-7AM, please contact night-coverage [email or web address] Password [email or web address] 09/15/2017, 10:09 AM

## 2017-09-15 NOTE — Plan of Care (Signed)
  Progressing Education: Knowledge of General Education information will improve 09/15/2017 2312 - Progressing by Cristela FeltSteffens, An Schnabel P, RN Health Behavior/Discharge Planning: Ability to manage health-related needs will improve 09/15/2017 2312 - Progressing by Cristela FeltSteffens, Maud Rubendall P, RN Clinical Measurements: Ability to maintain clinical measurements within normal limits will improve 09/15/2017 2312 - Progressing by Cristela FeltSteffens, Tressy Kunzman P, RN Will remain free from infection 09/15/2017 2312 - Progressing by Cristela FeltSteffens, Ova Meegan P, RN Diagnostic test results will improve 09/15/2017 2312 - Progressing by Cristela FeltSteffens, Akiko Schexnider P, RN Respiratory complications will improve 09/15/2017 2312 - Progressing by Cristela FeltSteffens, Jet Armbrust P, RN Cardiovascular complication will be avoided 09/15/2017 2312 - Progressing by Cristela FeltSteffens, Serenitie Vinton P, RN Activity: Risk for activity intolerance will decrease 09/15/2017 2312 - Progressing by Cristela FeltSteffens, Dynisha Due P, RN Nutrition: Adequate nutrition will be maintained 09/15/2017 2312 - Progressing by Cristela FeltSteffens, Vernis Cabacungan P, RN Coping: Level of anxiety will decrease 09/15/2017 2312 - Progressing by Cristela FeltSteffens, Hadli Vandemark P, RN Elimination: Will not experience complications related to urinary retention 09/15/2017 2312 - Progressing by Cristela FeltSteffens, Raina Sole P, RN Pain Managment: General experience of comfort will improve 09/15/2017 2312 - Progressing by Cristela FeltSteffens, Kamy Poinsett P, RN Safety: Ability to remain free from injury will improve 09/15/2017 2312 - Progressing by Cristela FeltSteffens, Krystin Keeven P, RN Skin Integrity: Risk for impaired skin integrity will decrease 09/15/2017 2312 - Progressing by Cristela FeltSteffens, Tyrae Alcoser P, RN Cardiac: Ability to achieve and maintain adequate cardiopulmonary perfusion will improve 09/15/2017 2312 - Progressing by Cristela FeltSteffens, Marck Mcclenny P, RN

## 2017-09-16 ENCOUNTER — Encounter (HOSPITAL_COMMUNITY): Payer: Self-pay | Admitting: Physician Assistant

## 2017-09-16 DIAGNOSIS — I441 Atrioventricular block, second degree: Secondary | ICD-10-CM

## 2017-09-16 HISTORY — DX: Atrioventricular block, second degree: I44.1

## 2017-09-16 LAB — BASIC METABOLIC PANEL
Anion gap: 10 (ref 5–15)
BUN: 47 mg/dL — AB (ref 6–20)
CALCIUM: 8.7 mg/dL — AB (ref 8.9–10.3)
CO2: 24 mmol/L (ref 22–32)
CREATININE: 1.92 mg/dL — AB (ref 0.61–1.24)
Chloride: 106 mmol/L (ref 101–111)
GFR calc Af Amer: 34 mL/min — ABNORMAL LOW (ref >60)
GFR calc non Af Amer: 29 mL/min — ABNORMAL LOW (ref >60)
GLUCOSE: 70 mg/dL (ref 65–99)
Potassium: 3.8 mmol/L (ref 3.5–5.1)
Sodium: 140 mmol/L (ref 135–145)

## 2017-09-16 LAB — GLUCOSE, CAPILLARY
GLUCOSE-CAPILLARY: 58 mg/dL — AB (ref 65–99)
GLUCOSE-CAPILLARY: 76 mg/dL (ref 65–99)
GLUCOSE-CAPILLARY: 152 mg/dL — AB (ref 65–99)
GLUCOSE-CAPILLARY: 133 mg/dL — AB (ref 65–99)
Glucose-Capillary: 123 mg/dL — ABNORMAL HIGH (ref 65–99)

## 2017-09-16 MED ORDER — INSULIN GLARGINE 100 UNIT/ML ~~LOC~~ SOLN
15.0000 [IU] | Freq: Every day | SUBCUTANEOUS | Status: DC
  Administered 2017-09-16 – 2017-09-22 (×7): 15 [IU] via SUBCUTANEOUS
  Filled 2017-09-16 (×8): qty 0.15

## 2017-09-16 NOTE — Progress Notes (Signed)
Hypoglycemic Event  CBG: 58  Treatment: 15 GM carbohydrate snack  Symptoms: None  Follow-up CBG: [id number]:[id number] CBG Result:76  Possible Reasons for Event: Unknown  Comments/MD notified  Arthur HolmsVega    Nilan Iddings, Francis LionsPamela D

## 2017-09-16 NOTE — Consult Note (Signed)
Cardiology Consultation:   Patient ID: Ronald Lindsey; [id number]; Aug 06, 1924   Admit date: 09/13/2017 Date of Consult: 09/16/2017  Primary Care Provider: Renford Dills, MD Primary Cardiologist: New Primary Electrophysiologist:  n/a   Patient Profile:   Ronald Lindsey is a 84 y.o. Lindsey with a hx of HTN, DM, Back pain w/ macular degeneration, who is being seen today for the evaluation of bradycardia and possible atrial fib, at the request of Dr Cena Benton.  History of Present Illness:   Ronald Lindsey describes increasing DOE, LE edema, orthopnea and PND. The sx worsened to the point that he came to the ER on 02/16. He was in CHF, Lasix given and breathing timproved. Currently on 40 mg IV bid. EF normal on echo, but no mention of diastolic dysfunction (grade 1 dd in 2016).    Pt bradycardic on admission, HR 54, ECG read as afib, but actually Mobitz I. HR remained in the 40s and 50s. One reading of 100, not accurate. Pt not on BB/CCB pta, was put on Coreg 3.125 mg bid to treat CHF, but some doses held due to low HR.   Pt denies presyncope or syncope pta. No hx bradycardia, never had cards eval. Never told he might need PPM.   Not that active around the house and SOB has been gradually progressive over time, so hard to sort out if any sx are related to low HR.   No hx CP w/ exertion but exertion level is not very high.   Past Medical History:  Diagnosis Date  . Acute diastolic CHF (congestive heart failure), NYHA class 3 (HCC) 09/13/2017  . Back pain   . Diabetes mellitus   . Diabetic neuropathy (HCC)   . Diabetic retinopathy   . GERD (gastroesophageal reflux disease)   . HTN (hypertension)   . Macular degeneration   . Previous back surgery 11/06/2011  . S/P appendectomy 11/06/2011    Past Surgical History:  Procedure Laterality Date  . BRAIN SURGERY    . CATARACT EXTRACTION     bilateral eyes  . COLONOSCOPY  11/08/2011   Procedure: COLONOSCOPY;  Surgeon: Graylin Shiver, MD;  Location: [facility]  ENDOSCOPY;  Service: Endoscopy;  Laterality: N/A;  with decompression  . IMPLANTABLE CONTACT LENS IMPLANTATION     RT eye successful, LT eye is difficult to see - pt states     Prior to Admission medications   Medication Sig Start Date End Date Taking? Authorizing Provider  aspirin EC 81 MG tablet Take 81 mg by mouth daily.   Yes [provider]  furosemide (LASIX) 40 MG tablet Take 80 mg by mouth 2 (two) times daily. 07/02/17  Yes [provider]  LANTUS SOLOSTAR 100 UNIT/ML Solostar Pen Inject 35 Units into the skin 2 (two) times daily. 09/12/17  Yes [provider]  lisinopril (PRINIVIL,ZESTRIL) 10 MG tablet Take 10 mg by mouth daily. 08/18/17  Yes [provider]  Multiple Vitamins-Minerals (PRESERVISION AREDS 2 PO) Take 1 tablet by mouth 2 (two) times daily.   Yes [provider]    Inpatient Medications: Scheduled Meds: . aspirin EC  81 mg Oral Daily  . carvedilol  3.125 mg Oral BID WC  . furosemide  40 mg Intravenous BID  . heparin  5,000 Units Subcutaneous Q8H  . insulin aspart  0-9 Units Subcutaneous TID WC  . insulin glargine  15 Units Subcutaneous QHS  . lisinopril  10 mg Oral Daily  . multivitamin  1 tablet Oral BID  .  sodium chloride flush  3 mL Intravenous Q12H  . traZODone  50 mg Oral Once   Continuous Infusions: . sodium chloride     PRN Meds: sodium chloride, acetaminophen, diphenhydrAMINE, ondansetron (ZOFRAN) IV, sodium chloride flush  Allergies:   No Known Allergies  Social History:   Social History   Socioeconomic History  . Marital status: Married    Spouse name: Not on file  . Number of children: Not on file  . Years of education: Not on file  . Highest education level: Not on file  Social Needs  . Financial resource strain: Not on file  . Food insecurity - worry: Not on file  . Food insecurity - inability: Not on file  . Transportation needs - medical: Not on file  . Transportation needs - non-medical:  Not on file  Occupational History  . Occupation: Retired  Tobacco Use  . Smoking status: Never Smoker  . Smokeless tobacco: Never Used  Substance and Sexual Activity  . Alcohol use: No  . Drug use: No  . Sexual activity: No  Other Topics Concern  . Not on file  Social History Narrative   [place] in [place]    Family History:   Family History  Problem Relation Age of Onset  . CAD Neg Hx    Family Status:  Family Status  Relation Name Status  . Mother  Deceased  . Father  Deceased  . Neg Hx  (Not Specified)    ROS:  Please see the history of present illness.  All other ROS reviewed and negative.     Physical Exam/Data:   Vitals:   09/15/17 1956 09/16/17 0513 09/16/17 0725 09/16/17 0916  BP: (!) 146/63 (!) 137/41 (!) 130/46 (!) 124/42  Pulse: (!) 47 100 (!) 110 (!) 49  Resp: 18 18    Temp: 98 F (36.7 C) 97.9 F (36.6 C)  97.6 F (36.4 C)  TempSrc: Oral Oral  Oral  SpO2: 98% 96% 96% 95%  Weight:  254 lb 13.6 oz (115.6 kg)    Height:        Intake/Output Summary (Last 24 hours) at 09/16/2017 1042 Last data filed at 09/16/2017 0600 Gross per 24 hour  Intake 343 ml  Output -  Net 343 ml   Filed Weights   09/14/17 0602 09/15/17 0428 09/16/17 0513  Weight: 256 lb 1.6 oz (116.2 kg) 253 lb 3.2 oz (114.9 kg) 254 lb 13.6 oz (115.6 kg)   Body mass index is 37.1 kg/m.  General:  Well nourished, well developed, Ronald Lindsey in no acute distress HEENT: normal Lymph: no adenopathy Neck: JVD 12 cm Endocrine:  No thryomegaly Vascular: No carotid bruits; upper extremity pulses 2+, LE pulses hard to detect due to edema  Cardiac:  normal S1, S2; RRR; no murmur Lungs:  Decreased BS bases bilaterally, no wheezing, rhonchi or rales  Abd: soft, nontender, no hepatomegaly  Ext: no edema Musculoskeletal:  No deformities, BUE and BLE strength normal and equal Skin: warm and dry  Neuro:  CNs 2-12 intact, no focal abnormalities noted Psych:  Normal affect    EKG:  The EKG was personally reviewed and demonstrates:  Mobitz 1, HR 54 Telemetry:  Telemetry was personally reviewed and demonstrates:  Bradycardia due to Mobitz I   Relevant CV Studies:  ECHO: 09/14/2017 - Left ventricle: The cavity size was normal. Wall thickness was   increased in a pattern of mild LVH. Systolic function was   vigorous.  The estimated ejection fraction was in the range of 65%   to 70%. - Mitral valve: There was mild regurgitation. (diastolic dysfunction not mentioned)  Laboratory Data:  Chemistry Recent Labs  Lab 09/14/17 0446 09/15/17 0535 09/16/17 0505  NA 141 140 140  K 4.6 4.1 3.8  CL 108 106 106  CO2 27 26 24   GLUCOSE 77 66 70  BUN 38* 44* 47*  CREATININE 1.95* 2.03* 1.92*  CALCIUM 8.6* 8.7* 8.7*  GFRNONAA 29* 27* 29*  GFRAA 33* 32* 34*  ANIONGAP 6 8 10     Lab Results  Component Value Date   ALT 34 09/13/2017   AST 64 (H) 09/13/2017   ALKPHOS 243 (H) 09/13/2017   BILITOT 0.9 09/13/2017   Hematology Recent Labs  Lab 09/13/17 1622  WBC 9.0  RBC 3.96*  HGB 13.6  HCT 40.9  MCV 103.3*  MCH 34.3*  MCHC 33.3  RDW 15.1  PLT 156   Cardiac Enzymes Recent Labs  Lab 09/13/17 1622 09/13/17 2303 09/14/17 0446 09/14/17 1110  TROPONINI 0.04* 0.05* 0.06* 0.06*     BNP Recent Labs  Lab 09/13/17 1622  BNP 518.8*    DDimer No results for input(s): DDIMER in the last 168 hours. TSH:  Lab Results  Component Value Date   TSH 3.039 09/13/2017   Lipids: Lab Results  Component Value Date   CHOL 92 11/11/2011   TRIG 113 11/11/2011   HgbA1c:No results found for: HGBA1C Magnesium:  Magnesium  Date Value Ref Range Status  09/13/2017 2.1 1.7 - 2.4 mg/dL Final    Comment:    Performed at [facility], [address], [place], [place] [address]     Radiology/Studies:  Dg Chest 2 View  Result Date: 09/13/2017 CLINICAL DATA:  Shortness of breath. EXAM: CHEST  2 VIEW COMPARISON:  Chest x-ray dated March 18, 2017. FINDINGS: Mild cardiomegaly and central vascular congestion. Atherosclerotic calcification of the aortic arch. Low lung volumes. Small bilateral pleural effusions with bibasilar atelectasis. No pneumothorax. No acute osseous abnormality. IMPRESSION: Pulmonary vascular congestion and small bilateral pleural effusions. Electronically Signed   By: Obie Dredge M.D.   On: 09/13/2017 15:39    Assessment and Plan:   Principal Problem: 1.  Acute diastolic CHF (congestive heart failure), NYHA class 3 (HCC) - agree w/ IV Lasix, continue to follow I/O, weights, BMET - pt admits to high-salt diet, hopefully he can modify this.   2. Mobitz I - present on admission - HR 40s, avoid BB/CCB for now - unclear if any of his sx are related to this or if low HR contributed to CHF. - need pt to ambulate and see if HR increases w/ ambulation, he is pretty weak right now. - continue to follow, once resp status improved, decide on PPM.  Otherwise per IM Active Problems:   HTN (hypertension)   GERD (gastroesophageal reflux disease)   Renal failure   DM (diabetes mellitus) (HCC)   CHF (congestive heart failure) (HCC)     For questions or updates, please contact [facility] Please consult [email or web address] for contact info under Cardiology/STEMI.   Melida Quitter, PA-C  09/16/2017 10:42 AM   Patient examined chart reviewed Discussed care with PA. Exam with Ronald Lindsey bilateral rales soft SEM plus 203 bilateral LE edema with erythema. Echo with hyperdynamic LV function mild MR. Telemetry with no afib. He has mostly 3:2 and 2:1 wenkebach which dates back to 2013 with chronic RBBB. No chest pain no  evidence of acute ischemic event. D/c coreg started in hospital. Continue bid lasix. Will watch on telemetry but no indication for TPM/PPM at this time avoid AV nodal blocking drugs Will Follow with you. Discussed possible need for pacer with patient. If he shows signs of below HIS block will  ask EP to see.   Charlton Haws

## 2017-09-16 NOTE — Plan of Care (Signed)
  Progressing Education: Knowledge of General Education information will improve 09/16/2017 2204 - Progressing by Cristela FeltSteffens, Rhiley Tarver P, RN Health Behavior/Discharge Planning: Ability to manage health-related needs will improve 09/16/2017 2204 - Progressing by Cristela FeltSteffens, Lailyn Appelbaum P, RN Clinical Measurements: Ability to maintain clinical measurements within normal limits will improve 09/16/2017 2204 - Progressing by Cristela FeltSteffens, Meagen Limones P, RN Will remain free from infection 09/16/2017 2204 - Progressing by Cristela FeltSteffens, Shivansh Hardaway P, RN Diagnostic test results will improve 09/16/2017 2204 - Progressing by Cristela FeltSteffens, Taniaya Rudder P, RN Respiratory complications will improve 09/16/2017 2204 - Progressing by Cristela FeltSteffens, Heavan Francom P, RN Cardiovascular complication will be avoided 09/16/2017 2204 - Progressing by Cristela FeltSteffens, Irais Mottram P, RN Activity: Risk for activity intolerance will decrease 09/16/2017 2204 - Progressing by Cristela FeltSteffens, Alicia Ackert P, RN Nutrition: Adequate nutrition will be maintained 09/16/2017 2204 - Progressing by Cristela FeltSteffens, Zeddie Njie P, RN Coping: Level of anxiety will decrease 09/16/2017 2204 - Progressing by Cristela FeltSteffens, Bostyn Kunkler P, RN Elimination: Will not experience complications related to bowel motility 09/16/2017 2204 - Progressing by Cristela FeltSteffens, Lasasha Brophy P, RN Will not experience complications related to urinary retention 09/16/2017 2204 - Progressing by Cristela FeltSteffens, Harlee Eckroth P, RN Pain Managment: General experience of comfort will improve 09/16/2017 2204 - Progressing by Cristela FeltSteffens, Laylah Riga P, RN Safety: Ability to remain free from injury will improve 09/16/2017 2204 - Progressing by Cristela FeltSteffens, Trystyn Dolley P, RN Skin Integrity: Risk for impaired skin integrity will decrease 09/16/2017 2204 - Progressing by Cristela FeltSteffens, Aloni Chuang P, RN Activity: Capacity to carry out activities will improve 09/16/2017 2204 - Progressing by Cristela FeltSteffens, Jemar Paulsen P, RN Cardiac: Ability to achieve and maintain adequate  cardiopulmonary perfusion will improve 09/16/2017 2204 - Progressing by Cristela FeltSteffens, Harvis Mabus P, RN

## 2017-09-16 NOTE — Progress Notes (Signed)
PROGRESS NOTE    Ronald Lindsey  ZOX:[id number] DOB: 1925/02/17 DOA: 09/13/2017 PCP: Renford Dills, MD    Brief Narrative:  84 y.o. male with medical history significant of diabetes with diabetic retinopathy and hypertension who presents to the ER with shortness of breath. Patient started having this exertional dyspnea over the last 1 week. There is associated declining functional capacity unable to walk or do what he normally does.   Currently being treated for heart failure. Most likely diastolic dysfunction.   Assessment & Plan:   Principal Problem:   CHF (congestive heart failure), NYHA class II (HCC) - Patient had a elevated BNP of 518 in the ED -Diuresing well on Lasix 40 mg IV BID, down > 2 Kg when reviewing weight.  - Chest x-ray reported pulmonary vascular congestion, patient thus diuresing and sob improved on lasix.  Active Problems:   HTN (hypertension) - continue lisinopril. B blocker held. See below  Bradycardia/Mobitz 1 - Consulted Cardiology and once breathing condition improves plan will be to see if patient is candidate for PPM - Cards discontinued coreg.     GERD (gastroesophageal reflux disease) - stable currently    Renal failure - stable, should serum creatinine trend up will have to consider discontinuing    DM (diabetes mellitus) (HCC) - Still hypoglycemic episode despite drastic reduction in Lantus. As such will make Lantus one timep er day at 15 units SQ. Continue on SSI - Diabetic diet.    DVT prophylaxis: Heparin Code Status: Full Family Communication: none at bedside Disposition Plan: home with continued diuresis.    Consultants:   Cardiology   Procedures: echocardiogram: reporting EF of 65-70%    Antimicrobials: none   Subjective: Pt has sob with activity. No new complaints otherwise.   Objective: Vitals:   09/16/17 0513 09/16/17 0725 09/16/17 0916 09/16/17 1320  BP: (!) 137/41 (!) 130/46 (!) 124/42 (!) 130/35  Pulse: 100 (!)  110 (!) 49 (!) 42  Resp: 18     Temp: 97.9 F (36.6 C)  97.6 F (36.4 C) (!) 97.5 F (36.4 C)  TempSrc: Oral  Oral Oral  SpO2: 96% 96% 95% 97%  Weight: 115.6 kg (254 lb 13.6 oz)     Height:        Intake/Output Summary (Last 24 hours) at 09/16/2017 1417 Last data filed at 09/16/2017 0600 Gross per 24 hour  Intake 103 ml  Output -  Net 103 ml   Filed Weights   09/14/17 0602 09/15/17 0428 09/16/17 0513  Weight: 116.2 kg (256 lb 1.6 oz) 114.9 kg (253 lb 3.2 oz) 115.6 kg (254 lb 13.6 oz)    Examination:  General exam: Appears calm and comfortable, pt in nad.  Respiratory system: CTA BL, no wheezes, equal chest rise. Cardiovascular system: S1 & S2 heard,. No murmurs, rubs, gallops or clicks. + pedal edema. Gastrointestinal system: Abdomen is nondistended, soft and nontender. No organomegaly or masses felt. Normal bowel sounds heard. Central nervous system: Alert and oriented. No focal neurological deficits. Extremities: Symmetric 5 x 5 power. Skin: No rashes, lesions or ulcers, on limited exam. Psychiatry:  Mood & affect appropriate.   Data Reviewed: I have personally reviewed following labs and imaging studies  CBC: Recent Labs  Lab 09/13/17 1622  WBC 9.0  NEUTROABS 4.4  HGB 13.6  HCT 40.9  MCV 103.3*  PLT 156   Basic Metabolic Panel: Recent Labs  Lab 09/13/17 1622 09/13/17 2303 09/14/17 0446 09/15/17 0535 09/16/17 0505  NA 141  --  141 140 140  K 4.6  --  4.6 4.1 3.8  CL 106  --  108 106 106  CO2 27  --  27 26 24   GLUCOSE 139*  --  77 66 70  BUN 35*  --  38* 44* 47*  CREATININE 1.85*  --  1.95* 2.03* 1.92*  CALCIUM 8.8*  --  8.6* 8.7* 8.7*  MG  --  2.1  --   --   --    GFR: Estimated Creatinine Clearance: 32.3 mL/min (A) (by C-G formula based on SCr of 1.92 mg/dL (H)). Liver Function Tests: Recent Labs  Lab 09/13/17 1622  AST 64*  ALT 34  ALKPHOS 243*  BILITOT 0.9  PROT 6.7  ALBUMIN 2.9*   No results for input(s): LIPASE, AMYLASE in the last  168 hours. No results for input(s): AMMONIA in the last 168 hours. Coagulation Profile: No results for input(s): INR, PROTIME in the last 168 hours. Cardiac Enzymes: Recent Labs  Lab 09/13/17 1622 09/13/17 2303 09/14/17 0446 09/14/17 1110  TROPONINI 0.04* 0.05* 0.06* 0.06*   BNP (last 3 results) No results for input(s): PROBNP in the last 8760 hours. HbA1C: No results for input(s): HGBA1C in the last 72 hours. CBG: Recent Labs  Lab 09/15/17 1704 09/15/17 2158 09/16/17 0730 09/16/17 0809 09/16/17 1140  GLUCAP 116* 134* 58* 76 152*   Lipid Profile: No results for input(s): CHOL, HDL, LDLCALC, TRIG, CHOLHDL, LDLDIRECT in the last 72 hours. Thyroid Function Tests: Recent Labs    09/13/17 2303  TSH 3.039   Anemia Panel: No results for input(s): VITAMINB12, FOLATE, FERRITIN, TIBC, IRON, RETICCTPCT in the last 72 hours. Sepsis Labs: No results for input(s): PROCALCITON, LATICACIDVEN in the last 168 hours.  No results found for this or any previous visit (from the past 240 hour(s)).       Radiology Studies: No results found.      Scheduled Meds: . aspirin EC  81 mg Oral Daily  . furosemide  40 mg Intravenous BID  . heparin  5,000 Units Subcutaneous Q8H  . insulin aspart  0-9 Units Subcutaneous TID WC  . insulin glargine  15 Units Subcutaneous QHS  . lisinopril  10 mg Oral Daily  . multivitamin  1 tablet Oral BID  . sodium chloride flush  3 mL Intravenous Q12H  . traZODone  50 mg Oral Once   Continuous Infusions: . sodium chloride       LOS: 3 days    Time spent: > 35 minutes  Penny Piarlando Ling Flesch, MD [facility] Pager [phone]  If 7PM-7AM, please contact night-coverage [email or web address] Password [email or web address] 09/16/2017, 2:17 PM

## 2017-09-16 NOTE — Care Management Important Message (Signed)
Important Message  Patient Details  Name: Ronald MaroonReid Lindsey Chumney MRN: [id number] Date of Birth: March 24, 1925   Medicare Important Message Given:  Yes    Caren MacadamFuller, Nyaire Denbleyker 09/16/2017, 10:57 AMImportant Message  Patient Details  Name: Ronald MaroonReid Lindsey Kisiel MRN: [id number] Date of Birth: March 24, 1925   Medicare Important Message Given:  Yes    Caren MacadamFuller, Esiah Bazinet 09/16/2017, 10:57 AM

## 2017-09-17 LAB — BASIC METABOLIC PANEL
ANION GAP: 8 (ref 5–15)
BUN: 55 mg/dL — AB (ref 6–20)
CALCIUM: 8.8 mg/dL — AB (ref 8.9–10.3)
CO2: 24 mmol/L (ref 22–32)
Chloride: 107 mmol/L (ref 101–111)
Creatinine, Ser: 2.07 mg/dL — ABNORMAL HIGH (ref 0.61–1.24)
GFR calc Af Amer: 31 mL/min — ABNORMAL LOW (ref >60)
GFR calc non Af Amer: 27 mL/min — ABNORMAL LOW (ref >60)
GLUCOSE: 102 mg/dL — AB (ref 65–99)
Potassium: 4.4 mmol/L (ref 3.5–5.1)
Sodium: 139 mmol/L (ref 135–145)

## 2017-09-17 LAB — GLUCOSE, CAPILLARY
Glucose-Capillary: 88 mg/dL (ref 65–99)
Glucose-Capillary: 128 mg/dL — ABNORMAL HIGH (ref 65–99)
Glucose-Capillary: 153 mg/dL — ABNORMAL HIGH (ref 65–99)
Glucose-Capillary: 171 mg/dL — ABNORMAL HIGH (ref 65–99)

## 2017-09-17 MED ORDER — TRAZODONE HCL 50 MG PO TABS
25.0000 mg | ORAL_TABLET | Freq: Every evening | ORAL | Status: DC | PRN
  Administered 2017-09-17 – 2017-09-21 (×4): 25 mg via ORAL
  Filled 2017-09-17 (×5): qty 1

## 2017-09-17 NOTE — Progress Notes (Signed)
Progress Note  Patient Name: Ronald Lindsey Date of Encounter: 09/17/2017  Primary Cardiologist: Charlton HawsPeter Amparo Donalson, MD   Subjective   No chest pain, SOB has improved but not being able to sleep has not.  Has been awake since 0400.  Inpatient Medications    Scheduled Meds: . aspirin EC  81 mg Oral Daily  . furosemide  40 mg Intravenous BID  . heparin  5,000 Units Subcutaneous Q8H  . insulin aspart  0-9 Units Subcutaneous TID WC  . insulin glargine  15 Units Subcutaneous QHS  . lisinopril  10 mg Oral Daily  . multivitamin  1 tablet Oral BID  . sodium chloride flush  3 mL Intravenous Q12H  . traZODone  50 mg Oral Once   Continuous Infusions: . sodium chloride     PRN Meds: sodium chloride, acetaminophen, diphenhydrAMINE, ondansetron (ZOFRAN) IV, sodium chloride flush   Vital Signs    Vitals:   09/16/17 0916 09/16/17 1320 09/16/17 2004 09/17/17 0422  BP: (!) 124/42 (!) 130/35 (!) 165/44 (!) 153/48  Pulse: (!) 49 (!) 42 (!) 49 (!) 50  Resp:   20 20  Temp: 97.6 F (36.4 C) (!) 97.5 F (36.4 C) 98 F (36.7 C) 98 F (36.7 C)  TempSrc: Oral Oral Oral Oral  SpO2: 95% 97% 100% 95%  Weight:    254 lb 13.6 oz (115.6 kg)  Height:        Intake/Output Summary (Last 24 hours) at 09/17/2017 0825 Last data filed at 09/17/2017 0600 Gross per 24 hour  Intake 1173 ml  Output -  Net 1173 ml   Filed Weights   09/15/17 0428 09/16/17 0513 09/17/17 0422  Weight: 253 lb 3.2 oz (114.9 kg) 254 lb 13.6 oz (115.6 kg) 254 lb 13.6 oz (115.6 kg)    Telemetry    Mobitz I  HR 40-50s during the day but at night 28-30 freq - Personally Reviewed  ECG    No new- Personally Reviewed  Physical Exam   GEN: No acute distress.   Neck: No JVD sitting up Cardiac: RRR to irregular, slow, no murmurs, rubs, or gallops.  Respiratory: decreased BS in bases, clear upper half of lungs to auscultation bilaterally. GI: Soft, nontender, non-distended  MS: 1+ edema; No deformity. Neuro:  Nonfocal    Psych: Normal affect   Labs    Chemistry Recent Labs  Lab 09/13/17 1622  09/15/17 0535 09/16/17 0505 09/17/17 0523  NA 141   < > 140 140 139  K 4.6   < > 4.1 3.8 4.4  CL 106   < > 106 106 107  CO2 27   < > 26 24 24   GLUCOSE 139*   < > 66 70 102*  BUN 35*   < > 44* 47* 55*  CREATININE 1.85*   < > 2.03* 1.92* 2.07*  CALCIUM 8.8*   < > 8.7* 8.7* 8.8*  PROT 6.7  --   --   --   --   ALBUMIN 2.9*  --   --   --   --   AST 64*  --   --   --   --   ALT 34  --   --   --   --   ALKPHOS 243*  --   --   --   --   BILITOT 0.9  --   --   --   --   GFRNONAA 30*   < > 27* 29* 27*  GFRAA 35*   < > 32* 34* 31*  ANIONGAP 8   < > 8 10 8    < > = values in this interval not displayed.     Hematology Recent Labs  Lab 09/13/17 1622  WBC 9.0  RBC 3.96*  HGB 13.6  HCT 40.9  MCV 103.3*  MCH 34.3*  MCHC 33.3  RDW 15.1  PLT 156    Cardiac Enzymes Recent Labs  Lab 09/13/17 1622 09/13/17 2303 09/14/17 0446 09/14/17 1110  TROPONINI 0.04* 0.05* 0.06* 0.06*   No results for input(s): TROPIPOC in the last 168 hours.   BNP Recent Labs  Lab 09/13/17 1622  BNP 518.8*     DDimer No results for input(s): DDIMER in the last 168 hours.   Radiology    No results found.  Cardiac Studies    ECHO: 09/14/2017 - Left ventricle: The cavity size was normal. Wall thickness was increased in a pattern of mild LVH. Systolic function was vigorous. The estimated ejection fraction was in the range of 65% to 70%. - Mitral valve: There was mild regurgitation. (diastolic dysfunction not mentioned)    Patient Profile     84 y.o. male with a hx of HTN, DM, Back pain w/ macular degeneration now admitted with SOB, leg edema and found to be bradycardic with Mobitz I with HR 40-50s.      Assessment & Plan    Acute diastolic CHF --IV lasix  With + 406 since admit --though improving --wt 258 on admit and now 254 lbs --Cr. 2.07 was 1.85 on admit  Mobitz I  --HR 40s avoid BB/CCB and  was not on rate lowering meds prior to admit.  No dizziness but at night HR to low 30s and upper 20s he does snore.   --monitor once ambulatory for PPM   HTN   --BP 164/44 today   DM --per IM   For questions or updates, please contact [facility] Please consult [email or web address] for contact info under Cardiology/STEMI.      Signed, Nada Boozer, NP  09/17/2017, 8:25 AM    Patient examined chart reviewed Some diuresis Echo confirms diastolic dysfunction LE edema some venous insufficiency as well erythema no active infection Basilar crackles no murmur continue ACE and diuretic for  BP no firm indication for pacing currently has he only has above HIS block and wenkebach will see what he  Does over next 48 hours and with ambulation  Charlton Haws

## 2017-09-17 NOTE — Progress Notes (Signed)
PROGRESS NOTE  Ronald Lindsey NWG:[id number] DOB: 1925-01-12 DOA: 09/13/2017 PCP: Renford Dills, MD  HPI/Recap of past 24 hours:  Sitting up on the side of bed, denies pain, no fever, report sob is improving, edema is improving Continue to have bradycardia but does not have symptom  Assessment/Plan: Principal Problem:   Acute diastolic CHF (congestive heart failure), NYHA class 3 (HCC) Active Problems:   HTN (hypertension)   GERD (gastroesophageal reflux disease)   Renal failure   DM (diabetes mellitus) (HCC)   CHF (congestive heart failure) (HCC)   Mobitz (type) I (Wenckebach's) atrioventricular block   Acute on chronic distolic CHF (congestive heart failure), NYHA class II (HCC) - Patient had a elevated BNP of 518 in the ED -Diuresing well on Lasix 40 mg IV BID, down > 2 Kg when reviewing weight.  - Chest x-ray reported pulmonary vascular congestion, patient thus diuresing and sob improved on lasix. But remain edematous  Bradycardia/Mobitz 1 - Consulted Cardiology and once breathing condition improves plan will be to see if patient is candidate for PPM - Cards discontinued coreg.     HTN (hypertension) - continue lisinopril. B blocker held due to bradycardia     GERD (gastroesophageal reflux disease) - stable currently    ckd III, cr stable at baseline - stable, should serum creatinine trend up will have to consider discontinuing    insulin dependent DM (diabetes mellitus) (HCC) - had  hypoglycemic episodes despite drastic reduction in Lantus.  -he is on lantus 35units bid, currently on Lantus 15 units SQ qd . Continue on SSI - Diabetic diet.  Check a1c     Code Status: full  Family Communication: patient   Disposition Plan: not ready to discharge   Consultants:  cardiology  Procedures:  none  Antibiotics:  none   Objective: BP (!) 153/48 (BP Location: Left Arm)   Pulse (!) 50   Temp 98 F (36.7 C) (Oral)   Resp 20   Ht 5' 9.5"  (1.765 m)   Wt 115.6 kg (254 lb 13.6 oz)   SpO2 95%   BMI 37.10 kg/m   Intake/Output Summary (Last 24 hours) at 09/17/2017 0809 Last data filed at 09/17/2017 0600 Gross per 24 hour  Intake 1173 ml  Output -  Net 1173 ml   Filed Weights   09/15/17 0428 09/16/17 0513 09/17/17 0422  Weight: 114.9 kg (253 lb 3.2 oz) 115.6 kg (254 lb 13.6 oz) 115.6 kg (254 lb 13.6 oz)    Exam: Patient is examined daily including today on 09/17/2017, exams remain the same as of yesterday except that has changed    General:  NAD, pleasant  Cardiovascular: RRR  Respiratory: CTABL  Abdomen: Soft/ND/NT, positive BS  Musculoskeletal: bilateral lower extremity pitting Edema, venous stasis changes  Neuro: alert, oriented   Data Reviewed: Basic Metabolic Panel: Recent Labs  Lab 09/13/17 1622 09/13/17 2303 09/14/17 0446 09/15/17 0535 09/16/17 0505 09/17/17 0523  NA 141  --  141 140 140 139  K 4.6  --  4.6 4.1 3.8 4.4  CL 106  --  108 106 106 107  CO2 27  --  27 26 24 24   GLUCOSE 139*  --  77 66 70 102*  BUN 35*  --  38* 44* 47* 55*  CREATININE 1.85*  --  1.95* 2.03* 1.92* 2.07*  CALCIUM 8.8*  --  8.6* 8.7* 8.7* 8.8*  MG  --  2.1  --   --   --   --  Liver Function Tests: Recent Labs  Lab 09/13/17 1622  AST 64*  ALT 34  ALKPHOS 243*  BILITOT 0.9  PROT 6.7  ALBUMIN 2.9*   No results for input(s): LIPASE, AMYLASE in the last 168 hours. No results for input(s): AMMONIA in the last 168 hours. CBC: Recent Labs  Lab 09/13/17 1622  WBC 9.0  NEUTROABS 4.4  HGB 13.6  HCT 40.9  MCV 103.3*  PLT 156   Cardiac Enzymes:   Recent Labs  Lab 09/13/17 1622 09/13/17 2303 09/14/17 0446 09/14/17 1110  TROPONINI 0.04* 0.05* 0.06* 0.06*   BNP (last 3 results) Recent Labs    09/13/17 1622  BNP 518.8*    ProBNP (last 3 results) No results for input(s): PROBNP in the last 8760 hours.  CBG: Recent Labs  Lab 09/16/17 0809 09/16/17 1140 09/16/17 1643 09/16/17 2008  09/17/17 0748  GLUCAP 76 152* 123* 133* 88    No results found for this or any previous visit (from the past 240 hour(s)).   Studies: No results found.  Scheduled Meds: . aspirin EC  81 mg Oral Daily  . furosemide  40 mg Intravenous BID  . heparin  5,000 Units Subcutaneous Q8H  . insulin aspart  0-9 Units Subcutaneous TID WC  . insulin glargine  15 Units Subcutaneous QHS  . lisinopril  10 mg Oral Daily  . multivitamin  1 tablet Oral BID  . sodium chloride flush  3 mL Intravenous Q12H  . traZODone  50 mg Oral Once    Continuous Infusions: . sodium chloride       Time spent: 35mins I have personally reviewed and interpreted on  09/17/2017 daily labs, tele strips, imagings as discussed above under date review session and assessment and plans.  I reviewed all nursing notes, pharmacy notes, consultant notes,  vitals, pertinent old records  I have discussed plan of care as described above with RN , patient on 09/17/2017   Albertine GratesFang Donnarae Rae MD, PhD  [facility] Pager [phone]. If 7PM-7AM, please contact night-coverage at [email or web address], password [email or web address] 09/17/2017, 8:09 AM  LOS: 4 days

## 2017-09-17 NOTE — Progress Notes (Signed)
Pt ambulated in hallway with assistance. HR increased to 58 bpm while ambulating. O2 saturations 93% on RA while ambulating, minimal SOB. Pt resting comfortably in bed.

## 2017-09-18 ENCOUNTER — Inpatient Hospital Stay (HOSPITAL_COMMUNITY): Payer: Medicare Other

## 2017-09-18 DIAGNOSIS — R609 Edema, unspecified: Secondary | ICD-10-CM

## 2017-09-18 DIAGNOSIS — R001 Bradycardia, unspecified: Secondary | ICD-10-CM

## 2017-09-18 LAB — GLUCOSE, CAPILLARY
GLUCOSE-CAPILLARY: 232 mg/dL — AB (ref 65–99)
Glucose-Capillary: 75 mg/dL (ref 65–99)
Glucose-Capillary: 123 mg/dL — ABNORMAL HIGH (ref 65–99)
Glucose-Capillary: 116 mg/dL — ABNORMAL HIGH (ref 65–99)

## 2017-09-18 LAB — HEMOGLOBIN A1C
Hgb A1c MFr Bld: 6.3 % — ABNORMAL HIGH (ref 4.8–5.6)
Mean Plasma Glucose: 134.11 mg/dL

## 2017-09-18 LAB — BASIC METABOLIC PANEL
Anion gap: 8 (ref 5–15)
BUN: 56 mg/dL — ABNORMAL HIGH (ref 6–20)
CALCIUM: 8.5 mg/dL — AB (ref 8.9–10.3)
CO2: 23 mmol/L (ref 22–32)
CREATININE: 2.07 mg/dL — AB (ref 0.61–1.24)
Chloride: 107 mmol/L (ref 101–111)
GFR calc Af Amer: 31 mL/min — ABNORMAL LOW (ref >60)
GFR calc non Af Amer: 27 mL/min — ABNORMAL LOW (ref >60)
Glucose, Bld: 81 mg/dL (ref 65–99)
Potassium: 4.3 mmol/L (ref 3.5–5.1)
Sodium: 138 mmol/L (ref 135–145)

## 2017-09-18 MED ORDER — LISINOPRIL 5 MG PO TABS
5.0000 mg | ORAL_TABLET | Freq: Every day | ORAL | Status: DC
  Administered 2017-09-19 – 2017-09-23 (×5): 5 mg via ORAL
  Filled 2017-09-18 (×5): qty 1

## 2017-09-18 NOTE — Progress Notes (Addendum)
Progress Note  Patient Name: Ronald Lindsey D Vinzant Date of Encounter: 09/18/2017  Primary Cardiologist: Charlton HawsPeter Carle Fenech, MD   Subjective   Sitting on side of bed.  No chest pain and no current SOB but no real difference per pt.  With walking in hall yesterday he had HR 58 sp02 at 93%, but significantly SOB.   HR in 20s at night. And when pt awake but in bed  Inpatient Medications    Scheduled Meds: . aspirin EC  81 mg Oral Daily  . furosemide  40 mg Intravenous BID  . heparin  5,000 Units Subcutaneous Q8H  . insulin aspart  0-9 Units Subcutaneous TID WC  . insulin glargine  15 Units Subcutaneous QHS  . lisinopril  10 mg Oral Daily  . multivitamin  1 tablet Oral BID  . sodium chloride flush  3 mL Intravenous Q12H   Continuous Infusions: . sodium chloride     PRN Meds: sodium chloride, acetaminophen, ondansetron (ZOFRAN) IV, sodium chloride flush, traZODone   Vital Signs    Vitals:   09/17/17 1326 09/17/17 1600 09/17/17 2059 09/18/17 0612  BP: (!) 149/35  (!) 151/43 (!) 125/37  Pulse: 97 (!) 58 (!) 48 100  Resp: 20  20 18   Temp: 97.7 F (36.5 C)  98.3 F (36.8 C) 98.8 F (37.1 C)  TempSrc: Oral  Oral Oral  SpO2: 100%  97% 92%  Weight:    251 lb 8.7 oz (114.1 kg)  Height:        Intake/Output Summary (Last 24 hours) at 09/18/2017 0914 Last data filed at 09/18/2017 [id number] Gross per 24 hour  Intake 600 ml  Output 1650 ml  Net -1050 ml   Filed Weights   09/16/17 0513 09/17/17 0422 09/18/17 0612  Weight: 254 lb 13.6 oz (115.6 kg) 254 lb 13.6 oz (115.6 kg) 251 lb 8.7 oz (114.1 kg)    Telemetry    Mobitz I and what appears II but long cycle.  Also occ escape beats -   Yesterday with ambulation HR to 58  Personally Reviewed  ECG    No new- Personally Reviewed  Physical Exam   GEN: No acute distress.   Neck: No JVD sitting up in bed.   Cardiac: IRRR, no murmurs, rubs, or gallops.  Respiratory: decreased in Rt base and wheezes in Lt base to auscultation  bilaterally. GI: Soft, nontender, non-distended  MS: 2+ edema; area on Lt leg with fluid blister drained and is wrapped.  No deformity. Neuro:  Nonfocal  Psych: Normal affect   Labs    Chemistry Recent Labs  Lab 09/13/17 1622  09/16/17 0505 09/17/17 0523 09/18/17 0504  NA 141   < > 140 139 138  K 4.6   < > 3.8 4.4 4.3  CL 106   < > 106 107 107  CO2 27   < > 24 24 23   GLUCOSE 139*   < > 70 102* 81  BUN 35*   < > 47* 55* 56*  CREATININE 1.85*   < > 1.92* 2.07* 2.07*  CALCIUM 8.8*   < > 8.7* 8.8* 8.5*  PROT 6.7  --   --   --   --   ALBUMIN 2.9*  --   --   --   --   AST 64*  --   --   --   --   ALT 34  --   --   --   --   ALKPHOS 243*  --   --   --   --  BILITOT 0.9  --   --   --   --   GFRNONAA 30*   < > 29* 27* 27*  GFRAA 35*   < > 34* 31* 31*  ANIONGAP 8   < > 10 8 8    < > = values in this interval not displayed.     Hematology Recent Labs  Lab 09/13/17 1622  WBC 9.0  RBC 3.96*  HGB 13.6  HCT 40.9  MCV 103.3*  MCH 34.3*  MCHC 33.3  RDW 15.1  PLT 156    Cardiac Enzymes Recent Labs  Lab 09/13/17 1622 09/13/17 2303 09/14/17 0446 09/14/17 1110  TROPONINI 0.04* 0.05* 0.06* 0.06*   No results for input(s): TROPIPOC in the last 168 hours.   BNP Recent Labs  Lab 09/13/17 1622  BNP 518.8*     DDimer No results for input(s): DDIMER in the last 168 hours.   Radiology    No results found.  Cardiac Studies   ECHO:09/14/2017 - Left ventricle: The cavity size was normal. Wall thickness was increased in a pattern of mild LVH. Systolic function was vigorous. The estimated ejection fraction was in the range of 65% to 70%. - Mitral valve: There was mild regurgitation. (diastolic dysfunction not mentioned)     Patient Profile     84 y.o. male with a hx of HTN, DM, Back pain w/ macular degeneration now admitted with SOB, leg edema and found to be bradycardic with Mobitz I with HR 40-50s.  in the 20s at times in the night.  Assessment & Plan   Acute diastolic CHF  BNP on admit 518 --lasix 40 mg IV BID  -- neg 284  And wt down from 256 to 251 lbs --Cr remains at 2.07  --DOE no chest pain   Mobitz I  --HR to 24 at times at night and when sitting in chair, no hx OSA but does snore.  With ambulation HR to 58.  No rate lowering meds.  HTN 151/43 to 125/37 stable   DM --per IM   For questions or updates, please contact [facility] Please consult [email or web address] for contact info under Cardiology/STEMI.      Signed, Nada Boozer, NP  09/18/2017, 9:14 AM    Patient examined chart reviewed still with volume overload and LE edema. Will not likely need inpatient PPM but will arrange outpatient f/u with EP as I suspect chronotropic incompetence is contributing to his dyspnea continue diuresis

## 2017-09-18 NOTE — Progress Notes (Signed)
Telemetry called and noted that Pt HR dropped in the 20s nonsustained. Pt checked and noted to be neurologically intact with no complaints of dizziness or discomfort. Will continue to monitor.

## 2017-09-18 NOTE — Consult Note (Addendum)
Cardiology Consultation:   Patient ID: Ronald Lindsey; [id number]; 1925/01/08   Admit date: 09/13/2017 Date of Consult: 09/18/2017  Primary Care Provider: Renford DillsPolite, Ronald, MD Primary Cardiologist: Charlton HawsPeter Nishan, MD  Primary Electrophysiologist:  none   Patient Profile:   Ronald MaroonReid D Lindsey is a 84 y.o. male with a hx of HTN, DM, CBP who is being seen today for the evaluation of bradycaria at the request of Nishan.  History of Present Illness:   Mr. Ronald Lindsey [facility] with increasing symptoms of DOE, eventually with symptoms of orthopnea and PND, admitted with CHF.    Cardiology was consulted 2/2 EKG suspected to be AFib, though noted to be Mobitz I, also observed to have nocturnal bradycardia.  At home not on any nodal blocking/rate limiting drugs, though in the hospital was started on Coreg 3.125mg .  He received a total of 3 doses, last was 09/16/17 at 0807.   LABS K+ 4.3 BUN/Creat 56/2.07 Mag 2.1 Trop I: 0.04, 0.05, 0.06 (x2) WBC 9.0 H/H 13/40 Plts 156 TSH 3.039  The patient describes a couple weeks of decreasing exertional capacity in the last several days having difficulty getting from the garage to the house, is 5 steps and a ery short distance.  IN the last 2 days prior to coming in developed symptoms of PND/orthopnea, the day he came in he and his wife went to eat as usual, just not feel well, though nothing specific, and towards the evening did not feel like he would be able to struggle through another night of SOB.  He does not describe any CP, palpitations, no reports of dizziness, near syncope or syncope.  He has never fainted.  He does not feel like he has made any progress feels just as short of breath as when he came.  Past Medical History:  Diagnosis Date  . Acute diastolic CHF (congestive heart failure), NYHA class 3 (HCC) 09/13/2017  . Back pain   . Diabetes mellitus   . Diabetic neuropathy (HCC)   . Diabetic retinopathy   . GERD (gastroesophageal  reflux disease)   . HTN (hypertension)   . Macular degeneration   . Mobitz (type) I (Wenckebach's) atrioventricular block 09/16/2017  . Previous back surgery 11/06/2011  . S/P appendectomy 11/06/2011    Past Surgical History:  Procedure Laterality Date  . BRAIN SURGERY    . CATARACT EXTRACTION     bilateral eyes  . COLONOSCOPY  11/08/2011   Procedure: COLONOSCOPY;  Surgeon: Graylin ShiverSalem F Ganem, MD;  Location: [facility] ENDOSCOPY;  Service: Endoscopy;  Laterality: N/A;  with decompression  . IMPLANTABLE CONTACT LENS IMPLANTATION     RT eye successful, LT eye is difficult to see - pt states      Inpatient Medications: Scheduled Meds: . aspirin EC  81 mg Oral Daily  . furosemide  40 mg Intravenous BID  . heparin  5,000 Units Subcutaneous Q8H  . insulin aspart  0-9 Units Subcutaneous TID WC  . insulin glargine  15 Units Subcutaneous QHS  . lisinopril  10 mg Oral Daily  . multivitamin  1 tablet Oral BID  . sodium chloride flush  3 mL Intravenous Q12H   Continuous Infusions: . sodium chloride     PRN Meds: sodium chloride, acetaminophen, ondansetron (ZOFRAN) IV, sodium chloride flush, traZODone  Allergies:   No Known Allergies  Social History:   Social History   Socioeconomic History  . Marital status: Married    Spouse name: Not on file  . Number  of children: Not on file  . Years of education: Not on file  . Highest education level: Not on file  Social Needs  . Financial resource strain: Not on file  . Food insecurity - worry: Not on file  . Food insecurity - inability: Not on file  . Transportation needs - medical: Not on file  . Transportation needs - non-medical: Not on file  Occupational History  . Occupation: Retired  Tobacco Use  . Smoking status: Never Smoker  . Smokeless tobacco: Never Used  Substance and Sexual Activity  . Alcohol use: No  . Drug use: No  . Sexual activity: No  Other Topics Concern  . Not on file  Social History Narrative   Pt lives with wife in  [place], [place]    Family History:    Family History  Problem Relation Age of Onset  . CAD Neg Hx      ROS:  Please see the history of present illness.  All other ROS reviewed and negative.     Physical Exam/Data:   Vitals:   09/17/17 1326 09/17/17 1600 09/17/17 2059 09/18/17 0612  BP: (!) 149/35  (!) 151/43 (!) 125/37  Pulse: 97 (!) 58 (!) 48 100  Resp: 20  20 18   Temp: 97.7 F (36.5 C)  98.3 F (36.8 C) 98.8 F (37.1 C)  TempSrc: Oral  Oral Oral  SpO2: 100%  97% 92%  Weight:    251 lb 8.7 oz (114.1 kg)  Height:        Intake/Output Summary (Last 24 hours) at 09/18/2017 1354 Last data filed at 09/18/2017 1610 Gross per 24 hour  Intake 480 ml  Output 825 ml  Net -345 ml   Filed Weights   09/16/17 0513 09/17/17 0422 09/18/17 0612  Weight: 254 lb 13.6 oz (115.6 kg) 254 lb 13.6 oz (115.6 kg) 251 lb 8.7 oz (114.1 kg)   Body mass index is 36.61 kg/m.  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  Relevant CV Studies:  09/14/17: TTE Study Conclusions - Left ventricle: The cavity size was normal. Wall thickness was   increased in a pattern of mild LVH. Systolic  function was   vigorous. The estimated ejection fraction was in the range of 65%   to 70%. - Mitral valve: There was mild regurgitation.  Laboratory Data:  Chemistry Recent Labs  Lab 09/16/17 0505 09/17/17 0523 09/18/17 0504  NA 140 139 138  K 3.8 4.4 4.3  CL 106 107 107  CO2 24 24 23   GLUCOSE 70 102* 81  BUN 47* 55* 56*  CREATININE 1.92* 2.07* 2.07*  CALCIUM 8.7* 8.8* 8.5*  GFRNONAA 29* 27* 27*  GFRAA 34*  31* 31*  ANIONGAP 10 8 8     Recent Labs  Lab 09/13/17 1622  PROT 6.7  ALBUMIN 2.9*  AST 64*  ALT 34  ALKPHOS 243*  BILITOT 0.9   Hematology Recent Labs  Lab 09/13/17 1622  WBC 9.0  RBC 3.96*  HGB 13.6  HCT 40.9  MCV 103.3*  MCH 34.3*  MCHC 33.3  RDW 15.1  PLT 156   Cardiac Enzymes Recent Labs  Lab 09/13/17 1622 09/13/17 2303 09/14/17 0446 09/14/17 1110  TROPONINI 0.04* 0.05* 0.06* 0.06*   No results for input(s): TROPIPOC in the last 168 hours.  BNP Recent Labs  Lab 09/13/17 1622  BNP 518.8*    DDimer No results for input(s): DDIMER in the last 168 hours.  Radiology/Studies:  No results found.  Assessment and Plan:   1. Bradycardia     Baseline conduction system disease goes back to 2013, Mobitz I, RBBB, LAD     Primarily nocturnal bradycardia has been observed (to high 20's), awake brady 30;s is reported though at rest     Charted with ambulation SB 50's  2. Acute CHF, HFpEF     missing a day of charted out put, appears fairly euvolemic by the numbers     Down 7 lbs (suspect initial weight is inaccurate)  Remains with DOE  Continues diuresis with primary cardiology team, remains quite edematous, no rest SOB w/HOB at 28degrees.  + DOE remains unchanged.  Dr. Elberta Fortis has seen and examined the patient Nana Vastine eventually need pacing,  Tashon Capp make outpatient EP follow up given no hx of dizziness, near syncope or syncope, longstanding conduction system disease.   Would prefer to f/u out patient once diuresed and out of the hospital, prior  to pacer implant.   For questions or updates, please contact [facility] Please consult [email or web address] for contact info under Cardiology/STEMI.   Signed, Sheilah Pigeon, PA-C  09/18/2017 1:54 PM  I have seen and examined this patient with Francis Dowse.  Agree with above, note added to reflect my findings.  On exam, iRRR, no murmurs, lungs clear, 2+ LE edema. Contining to have SOB with ambulation. Naif Alabi likely need continued diuresis. Having Mobitz 1 AV block as well as intermittent 2:1 AV block. This could be contiriuting to his SOB, though in review of ECGs, he has had mobitz I block for many years. 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 That being said, his bradycardia could also be contributing to his SOB. Would continue diuresis. Would prefer to have him out of the hospital prior to pacemaker implant. Carle Fenech arrange clinic follow up for further pacemaker discussion. If his rhythm abnormality worsens, would potentially need pacemaker as an impatient.    Myron Stankovich M. Arcelia Pals MD 09/18/2017 8:03 PM

## 2017-09-18 NOTE — Progress Notes (Signed)
LE venous duplex prelim: negative for DVT. Feliciano Wynter Eunice, RDMS, RVT  

## 2017-09-18 NOTE — Progress Notes (Signed)
PROGRESS NOTE  Ronald Lindsey:[id number] DOB: 11/16/1924 DOA: 09/13/2017 PCP: Renford Dills, MD  HPI/Recap of past 24 hours:  Remain edematous, bradycardia at night denies pain, no fever,   Assessment/Plan: Principal Problem:   Acute diastolic CHF (congestive heart failure), NYHA class 3 (HCC) Active Problems:   HTN (hypertension)   GERD (gastroesophageal reflux disease)   Renal failure   DM (diabetes mellitus) (HCC)   CHF (congestive heart failure) (HCC)   Mobitz (type) I (Wenckebach's) atrioventricular block   Acute on chronic distolic CHF (congestive heart failure), NYHA class II (HCC) - Patient had a elevated BNP of 518 in the ED, Chest x-ray reported pulmonary vascular congestion, -on Lasix 40 mg IV BID, 9 he is on lasix 80mg  bid at home) -  .But remain edematous -cardiology to manage diuresis, will follow cardiology recommendation.  Bradycardia/Mobitz 1 - coreg discontinued -Follow cardiology and EP recommendation  ckd III, cr stable at baseline - stable, should serum creatinine trend up will have to consider discontinuing    insulin dependent DM (diabetes mellitus) (HCC) - had  hypoglycemic episodes despite drastic reduction in Lantus.  -he is on lantus 35units bid, currently on Lantus 15 units SQ qd . Continue on SSI, a.m. blood glucose today on 2/21 is 75 - Diabetic diet.  - a1c  6.3   HTN (hypertension) -BP stable IV Lasix , continue lisinopril. B blocker discontinued due to bradycardia     GERD (gastroesophageal reflux disease) - stable currently     Code Status: full  Family Communication: patient   Disposition Plan: not ready to discharge, remain edematous, cardiology manage diuresis   Consultants:  cardiology  Procedures:  none  Antibiotics:  none   Objective: BP (!) 125/37 (BP Location: Right Arm)   Pulse 100   Temp 98.8 F (37.1 C) (Oral)   Resp 18   Ht 5' 9.5" (1.765 m)   Wt 114.1 kg (251 lb 8.7 oz)   SpO2 92%    BMI 36.61 kg/m   Intake/Output Summary (Last 24 hours) at 09/18/2017 1040 Last data filed at 09/18/2017 8119 Gross per 24 hour  Intake 600 ml  Output 1300 ml  Net -700 ml   Filed Weights   09/16/17 0513 09/17/17 0422 09/18/17 0612  Weight: 115.6 kg (254 lb 13.6 oz) 115.6 kg (254 lb 13.6 oz) 114.1 kg (251 lb 8.7 oz)    Exam: Patient is examined daily including today on 09/18/2017, exams remain the same as of yesterday except that has changed    General:  NAD, pleasant  Cardiovascular: RRR  Respiratory: CTABL  Abdomen: Soft/ND/NT, positive BS  Musculoskeletal: bilateral lower extremity pitting Edema, venous stasis changes, few open blisters on lower extremity ,does not appear infected  Neuro: alert, oriented   Data Reviewed: Basic Metabolic Panel: Recent Labs  Lab 09/13/17 2303 09/14/17 0446 09/15/17 0535 09/16/17 0505 09/17/17 0523 09/18/17 0504  NA  --  141 140 140 139 138  K  --  4.6 4.1 3.8 4.4 4.3  CL  --  108 106 106 107 107  CO2  --  27 26 24 24 23   GLUCOSE  --  77 66 70 102* 81  BUN  --  38* 44* 47* 55* 56*  CREATININE  --  1.95* 2.03* 1.92* 2.07* 2.07*  CALCIUM  --  8.6* 8.7* 8.7* 8.8* 8.5*  MG 2.1  --   --   --   --   --    Liver Function Tests:  Recent Labs  Lab 09/13/17 1622  AST 64*  ALT 34  ALKPHOS 243*  BILITOT 0.9  PROT 6.7  ALBUMIN 2.9*   No results for input(s): LIPASE, AMYLASE in the last 168 hours. No results for input(s): AMMONIA in the last 168 hours. CBC: Recent Labs  Lab 09/13/17 1622  WBC 9.0  NEUTROABS 4.4  HGB 13.6  HCT 40.9  MCV 103.3*  PLT 156   Cardiac Enzymes:   Recent Labs  Lab 09/13/17 1622 09/13/17 2303 09/14/17 0446 09/14/17 1110  TROPONINI 0.04* 0.05* 0.06* 0.06*   BNP (last 3 results) Recent Labs    09/13/17 1622  BNP 518.8*    ProBNP (last 3 results) No results for input(s): PROBNP in the last 8760 hours.  CBG: Recent Labs  Lab 09/17/17 0748 09/17/17 1225 09/17/17 1729 09/17/17 2104  09/18/17 0745  GLUCAP 88 128* 153* 171* 75    No results found for this or any previous visit (from the past 240 hour(s)).   Studies: No results found.  Scheduled Meds: . aspirin EC  81 mg Oral Daily  . furosemide  40 mg Intravenous BID  . heparin  5,000 Units Subcutaneous Q8H  . insulin aspart  0-9 Units Subcutaneous TID WC  . insulin glargine  15 Units Subcutaneous QHS  . lisinopril  10 mg Oral Daily  . multivitamin  1 tablet Oral BID  . sodium chloride flush  3 mL Intravenous Q12H    Continuous Infusions: . sodium chloride       Time spent: 25mins I have personally reviewed and interpreted on  09/18/2017 daily labs, tele strips, imagings as discussed above under date review session and assessment and plans.  I reviewed all nursing notes, pharmacy notes, consultant notes,  vitals, pertinent old records  I have discussed plan of care as described above with RN , patient on 09/18/2017   Albertine GratesFang Javell Blackburn MD, PhD  [facility] Pager [phone]. If 7PM-7AM, please contact night-coverage at [email or web address], password [email or web address] 09/18/2017, 10:40 AM  LOS: 5 days

## 2017-09-19 LAB — CBC WITH DIFFERENTIAL/PLATELET
BASOS PCT: 0 %
Basophils Absolute: 0 10*3/uL (ref 0.0–0.1)
Eosinophils Absolute: 0.3 10*3/uL (ref 0.0–0.7)
Eosinophils Relative: 5 %
HEMATOCRIT: 36.9 % — AB (ref 39.0–52.0)
HEMOGLOBIN: 12.1 g/dL — AB (ref 13.0–17.0)
Lymphocytes Relative: 35 %
Lymphs Abs: 1.9 10*3/uL (ref 0.7–4.0)
MCH: 34.4 pg — ABNORMAL HIGH (ref 26.0–34.0)
MCHC: 32.8 g/dL (ref 30.0–36.0)
MCV: 104.8 fL — ABNORMAL HIGH (ref 78.0–100.0)
MONOS PCT: 13 %
Monocytes Absolute: 0.7 10*3/uL (ref 0.1–1.0)
NEUTROS ABS: 2.5 10*3/uL (ref 1.7–7.7)
NEUTROS PCT: 47 %
Platelets: 163 10*3/uL (ref 150–400)
RBC: 3.52 MIL/uL — AB (ref 4.22–5.81)
RDW: 15.3 % (ref 11.5–15.5)
WBC: 5.5 10*3/uL (ref 4.0–10.5)

## 2017-09-19 LAB — BASIC METABOLIC PANEL
Anion gap: 7 (ref 5–15)
BUN: 62 mg/dL — AB (ref 6–20)
CALCIUM: 8.5 mg/dL — AB (ref 8.9–10.3)
CO2: 24 mmol/L (ref 22–32)
Chloride: 108 mmol/L (ref 101–111)
Creatinine, Ser: 2.14 mg/dL — ABNORMAL HIGH (ref 0.61–1.24)
GFR calc Af Amer: 30 mL/min — ABNORMAL LOW (ref >60)
GFR, EST NON AFRICAN AMERICAN: 26 mL/min — AB (ref >60)
GLUCOSE: 127 mg/dL — AB (ref 65–99)
Potassium: 4.4 mmol/L (ref 3.5–5.1)
SODIUM: 139 mmol/L (ref 135–145)

## 2017-09-19 LAB — GLUCOSE, CAPILLARY
Glucose-Capillary: 90 mg/dL (ref 65–99)
Glucose-Capillary: 201 mg/dL — ABNORMAL HIGH (ref 65–99)
Glucose-Capillary: 246 mg/dL — ABNORMAL HIGH (ref 65–99)
Glucose-Capillary: 136 mg/dL — ABNORMAL HIGH (ref 65–99)

## 2017-09-19 LAB — MAGNESIUM: Magnesium: 2.4 mg/dL (ref 1.7–2.4)

## 2017-09-19 MED ORDER — HYDROCERIN EX CREA
TOPICAL_CREAM | CUTANEOUS | Status: DC
  Administered 2017-09-19: 19:00:00 via TOPICAL
  Filled 2017-09-19: qty 113

## 2017-09-19 MED ORDER — HYDRALAZINE HCL 20 MG/ML IJ SOLN
10.0000 mg | Freq: Four times a day (QID) | INTRAMUSCULAR | Status: DC | PRN
Start: 2017-09-19 — End: 2017-09-23

## 2017-09-19 MED ORDER — INSULIN ASPART 100 UNIT/ML ~~LOC~~ SOLN
3.0000 [IU] | Freq: Three times a day (TID) | SUBCUTANEOUS | Status: DC
  Administered 2017-09-20 – 2017-09-23 (×8): 3 [IU] via SUBCUTANEOUS

## 2017-09-19 NOTE — Consult Note (Signed)
WOC Nurse wound consult note Reason for Consult:Patient with LE edema (bilaterally) and full thickness wound on LLE, medial aspect.  Small wound draining serous fluid on RLE.  Heels intact, sacrum intact.  Awaiting wound healing prior to pacemaker implantation.  Wound type: venous insufficiency Pressure Injury POA: NA Measurement: Left LE: 4.2cm x 2.5cm x 0.2cm with ruddy, red appearance, no fresh exudate.  Smaller wound at 1-2 o'clock measures 2cm x 0.4cm x 0.1cm with serous exudate. RLE:  .05cm round x 0.1cm  partial thickness wound with serous exudate Wound bed:As describe dabove Drainage (amount, consistency, odor) As described above Periwound: edematous with erythema in LLE and dry, flaking skin on RLE.  Macular ash on posterior RLE covering a 12cm x 14cm area.  Patient denies itching or discomfort in this area. Dressing procedure/placement/frequency: We will dress wound with a topical antimicrobial (silver hydrofiber) and compress.  Initially I preferred a dry boot, but after consideration of urgency, agree with applying a zinc-past boot and changing twice weekly.  [facility] until healed. Expeditious healing is the priority due to patient needing to have a pacemaker implanted. Orders provided for nursing team for wound care. Additionally, I have included moisturizing with Eucerin cream prior to Unna's boot application as they tend to dry tissues, pressure redistribution heel boots for use in bed and a pressure redistribution chair cushion for OOB use to prevent pressure injury.  WOC nursing team will not follow, but will remain available to this patient, the nursing and medical teams.  Please re-consult if needed. Thanks, Ladona MowLaurie Pepper Wyndham, MSN, RN, GNP, Leda MinCWOCN, CWON-AP, FAAN  Pager# [phone].

## 2017-09-19 NOTE — Progress Notes (Signed)
PROGRESS NOTE  Ronald Lindsey:[id number] DOB: 1924/09/02 DOA: 09/13/2017 PCP: Renford Dills, MD  HPI/Recap of past 24 hours:  Remain edematous, legs are weeping Remain DOE, no cough, no chest pain bradycardia at night denies pain, no fever,  He states the urine output was not accurately documented, he reports he urinated more than recorded.  Wife at bedside  Assessment/Plan: Principal Problem:   Acute diastolic CHF (congestive heart failure), NYHA class 3 (HCC) Active Problems:   HTN (hypertension)   GERD (gastroesophageal reflux disease)   Renal failure   DM (diabetes mellitus) (HCC)   CHF (congestive heart failure) (HCC)   Mobitz (type) I (Wenckebach's) atrioventricular block   Acute on chronic distolic CHF (congestive heart failure), NYHA class II (HCC) - Patient had a elevated BNP of 518 in the ED, Chest x-ray reported pulmonary vascular congestion, -on Lasix 40 mg IV BID, ( he is on lasix 80mg  bid at home) -  .But remain edematous -cardiology to manage diuresis, will follow cardiology recommendation.  Bradycardia/Mobitz 1 - coreg discontinued -Follow cardiology and EP recommendation  Bilateral lower extremity edema Weeping open wound, does not appear infected Wound care consulted, Unna boots bilaterally recommended She will benefit from outpatient wound care follow-up as well  ckd III, cr stable at baseline - will need to differentiate between over diuresis versus cardiorenal syndrome if creatinine go up    insulin dependent DM (diabetes mellitus) (HCC) - had  hypoglycemic episodes despite drastic reduction in Lantus.  -he is on lantus 35units bid, currently on Lantus 15 units SQ qd . Continue on SSI, a.m. blood glucose today on 2/22 is 90. He does has postprandial hyperglycemia, will start meal cartridge - Diabetic diet.  - a1c  6.3   HTN (hypertension) -BP stable IV Lasix , continue lisinopril. B blocker discontinued due to bradycardia     GERD  (gastroesophageal reflux disease) - stable currently     Code Status: full  Family Communication: patient   Disposition Plan: not ready to discharge, remain edematous, cardiology manage diuresis   Consultants:  cardiology  Procedures:  none  Antibiotics:  none   Objective: BP (!) 186/43 (BP Location: Left Arm)   Pulse (!) 44   Temp 97.7 F (36.5 C) (Oral)   Resp 20   Ht 5' 9.5" (1.765 m)   Wt 115.3 kg (254 lb 3.1 oz)   SpO2 97%   BMI 37.00 kg/m   Intake/Output Summary (Last 24 hours) at 09/19/2017 1718 Last data filed at 09/19/2017 1534 Gross per 24 hour  Intake 240 ml  Output 2100 ml  Net -1860 ml   Filed Weights   09/17/17 0422 09/18/17 0612 09/19/17 0504  Weight: 115.6 kg (254 lb 13.6 oz) 114.1 kg (251 lb 8.7 oz) 115.3 kg (254 lb 3.1 oz)    Exam: Patient is examined daily including today on 09/19/2017, exams remain the same as of yesterday except that has changed    General:  NAD, pleasant  Cardiovascular: RRR  Respiratory: CTABL  Abdomen: Soft/ND/NT, positive BS  Musculoskeletal: bilateral lower extremity pitting Edema, venous stasis changes, few open blisters on lower extremity ,does not appear infected  Neuro: alert, oriented   Data Reviewed: Basic Metabolic Panel: Recent Labs  Lab 09/13/17 2303  09/15/17 0535 09/16/17 0505 09/17/17 0523 09/18/17 0504 09/19/17 0451  NA  --    < > 140 140 139 138 139  K  --    < > 4.1 3.8 4.4 4.3 4.4  CL  --    < >  106 106 107 107 108  CO2  --    < > 26 24 24 23 24   GLUCOSE  --    < > 66 70 102* 81 127*  BUN  --    < > 44* 47* 55* 56* 62*  CREATININE  --    < > 2.03* 1.92* 2.07* 2.07* 2.14*  CALCIUM  --    < > 8.7* 8.7* 8.8* 8.5* 8.5*  MG 2.1  --   --   --   --   --  2.4   < > = values in this interval not displayed.   Liver Function Tests: Recent Labs  Lab 09/13/17 1622  AST 64*  ALT 34  ALKPHOS 243*  BILITOT 0.9  PROT 6.7  ALBUMIN 2.9*   No results for input(s): LIPASE, AMYLASE in  the last 168 hours. No results for input(s): AMMONIA in the last 168 hours. CBC: Recent Labs  Lab 09/13/17 1622 09/19/17 0451  WBC 9.0 5.5  NEUTROABS 4.4 2.5  HGB 13.6 12.1*  HCT 40.9 36.9*  MCV 103.3* 104.8*  PLT 156 163   Cardiac Enzymes:   Recent Labs  Lab 09/13/17 1622 09/13/17 2303 09/14/17 0446 09/14/17 1110  TROPONINI 0.04* 0.05* 0.06* 0.06*   BNP (last 3 results) Recent Labs    09/13/17 1622  BNP 518.8*    ProBNP (last 3 results) No results for input(s): PROBNP in the last 8760 hours.  CBG: Recent Labs  Lab 09/18/17 1204 09/18/17 1642 09/18/17 2200 09/19/17 0751 09/19/17 1129  GLUCAP 123* 116* 232* 90 201*    No results found for this or any previous visit (from the past 240 hour(s)).   Studies: No results found.  Scheduled Meds: . aspirin EC  81 mg Oral Daily  . furosemide  40 mg Intravenous BID  . heparin  5,000 Units Subcutaneous Q8H  . hydrocerin   Topical Once per day on Mon Thu  . insulin aspart  0-9 Units Subcutaneous TID WC  . insulin glargine  15 Units Subcutaneous QHS  . lisinopril  5 mg Oral Daily  . multivitamin  1 tablet Oral BID  . sodium chloride flush  3 mL Intravenous Q12H    Continuous Infusions: . sodium chloride       Time spent: 25mins I have personally reviewed and interpreted on  09/19/2017 daily labs, tele strips, imagings as discussed above under date review session and assessment and plans.  I reviewed all nursing notes, pharmacy notes, consultant notes,  vitals, pertinent old records  I have discussed plan of care as described above with RN , patient on 09/19/2017   Albertine GratesFang Rahul Malinak MD, PhD  [facility] Pager [phone]. If 7PM-7AM, please contact night-coverage at [email or web address], password [email or web address] 09/19/2017, 5:18 PM  LOS: 6 days

## 2017-09-19 NOTE — Progress Notes (Addendum)
Progress Note  Patient Name: Ronald Lindsey Date of Encounter: 09/19/2017  Primary Cardiologist: Charlton HawsPeter Laguana Desautel, MD   Subjective   No chest pain, still SOB, in bed with talking SOB.  Did not walk yesterday.    Inpatient Medications    Scheduled Meds: . aspirin EC  81 mg Oral Daily  . furosemide  40 mg Intravenous BID  . heparin  5,000 Units Subcutaneous Q8H  . insulin aspart  0-9 Units Subcutaneous TID WC  . insulin glargine  15 Units Subcutaneous QHS  . lisinopril  5 mg Oral Daily  . multivitamin  1 tablet Oral BID  . sodium chloride flush  3 mL Intravenous Q12H   Continuous Infusions: . sodium chloride     PRN Meds: sodium chloride, acetaminophen, ondansetron (ZOFRAN) IV, sodium chloride flush, traZODone   Vital Signs    Vitals:   09/18/17 0612 09/18/17 1430 09/18/17 2157 09/19/17 0504  BP: (!) 125/37 (!) 135/51 (!) 159/43 (!) 127/41  Pulse: 100 (!) 45 (!) 50 (!) 40  Resp: 18 20 18 20   Temp: 98.8 F (37.1 C) 98.7 F (37.1 C) 98.2 F (36.8 C) 98 F (36.7 C)  TempSrc: Oral Oral Oral Oral  SpO2: 92% 92% 96% 95%  Weight: 251 lb 8.7 oz (114.1 kg)   254 lb 3.1 oz (115.3 kg)  Height:        Intake/Output Summary (Last 24 hours) at 09/19/2017 0749 Last data filed at 09/18/2017 2223 Gross per 24 hour  Intake 480 ml  Output 1150 ml  Net -670 ml   Filed Weights   09/17/17 0422 09/18/17 0612 09/19/17 0504  Weight: 254 lb 13.6 oz (115.6 kg) 251 lb 8.7 oz (114.1 kg) 254 lb 3.1 oz (115.3 kg)    Telemetry    Mobitz I HR 40-50 until night then 30s occ 20s - Personally Reviewed  ECG    No new - Personally Reviewed  Physical Exam   GEN: No acute distress.   Neck: No JVD Cardiac: RRR, no murmurs, rubs, or gallops.  Respiratory: occ wheeze lt and decreased breath sounds  to auscultation bilaterally. GI: Soft, nontender, non-distended  MS: + edema but improved, while in bed, legs red; No deformity. Neuro:  Nonfocal  Psych: Normal affect   Labs     Chemistry Recent Labs  Lab 09/13/17 1622  09/17/17 0523 09/18/17 0504 09/19/17 0451  NA 141   < > 139 138 139  K 4.6   < > 4.4 4.3 4.4  CL 106   < > 107 107 108  CO2 27   < > 24 23 24   GLUCOSE 139*   < > 102* 81 127*  BUN 35*   < > 55* 56* 62*  CREATININE 1.85*   < > 2.07* 2.07* 2.14*  CALCIUM 8.8*   < > 8.8* 8.5* 8.5*  PROT 6.7  --   --   --   --   ALBUMIN 2.9*  --   --   --   --   AST 64*  --   --   --   --   ALT 34  --   --   --   --   ALKPHOS 243*  --   --   --   --   BILITOT 0.9  --   --   --   --   GFRNONAA 30*   < > 27* 27* 26*  GFRAA 35*   < > 31* 31* 30*  ANIONGAP 8   < > 8 8 7    < > = values in this interval not displayed.     Hematology Recent Labs  Lab 09/13/17 1622 09/19/17 0451  WBC 9.0 5.5  RBC 3.96* 3.52*  HGB 13.6 12.1*  HCT 40.9 36.9*  MCV 103.3* 104.8*  MCH 34.3* 34.4*  MCHC 33.3 32.8  RDW 15.1 15.3  PLT 156 163    Cardiac Enzymes Recent Labs  Lab 09/13/17 1622 09/13/17 2303 09/14/17 0446 09/14/17 1110  TROPONINI 0.04* 0.05* 0.06* 0.06*   No results for input(s): TROPIPOC in the last 168 hours.   BNP Recent Labs  Lab 09/13/17 1622  BNP 518.8*     DDimer No results for input(s): DDIMER in the last 168 hours.   Radiology    No results found.  Cardiac Studies   ECHO:09/14/2017 - Left ventricle: The cavity size was normal. Wall thickness was increased in a pattern of mild LVH. Systolic function was vigorous. The estimated ejection fraction was in the range of 65% to 70%. - Mitral valve: There was mild regurgitation. (diastolic dysfunction not mentioned)    Patient Profile     84 y.o. male with a hx of HTN, DM, Back pain w/ macular degeneration now admitted with SOB, leg edema and found to be bradycardic with Mobitz I with HR 40-50s.  EP saw yesterday.    Assessment & Plan    Acute diastolic CHF --IV lasix  With neg 1194  --wt 258 on admit and now 254 lbs --Cr. 2.14 was 1.85 on admit  Lower ext edema-   Cannot wear support stockings- too difficult for he or his wife to put on.   Mobitz I  --HR 40s avoid BB/CCB and was not on rate lowering meds prior to admit.  No dizziness but at night HR to low 30s and upper 20s he does snore.   --monitor once ambulatory for PPM  --eval by EP and plan for outpt follow up and PPM, believe DOE will improve with PPM --ambulate   HTN   --BP 127/41 today   DM --per IM     For questions or updates, please contact [facility] Please consult [email or web address] for contact info under Cardiology/STEMI.      Signed, Nada Boozer, NP  09/19/2017, 7:49 AM    Patient examined chart reviewed Exam still with open wound medial aspect LLE Both legs with edema and erythema Appreciate Dr Elberta Fortis seeing Agree no need for inpatient pacing and should not have open wound before PPM increased risk of infecting device. Should have primary service make f/u appt with [facility] wound center. Continue diuresis with lasix Have made outpatient f/u appt with Dr Elberta Fortis HR 45-50 this am with mostly wenkebach  Ok to d/c over weekend   [employer]

## 2017-09-20 DIAGNOSIS — I441 Atrioventricular block, second degree: Secondary | ICD-10-CM

## 2017-09-20 DIAGNOSIS — N183 Chronic kidney disease, stage 3 (moderate): Secondary | ICD-10-CM

## 2017-09-20 DIAGNOSIS — R600 Localized edema: Secondary | ICD-10-CM

## 2017-09-20 DIAGNOSIS — I10 Essential (primary) hypertension: Secondary | ICD-10-CM

## 2017-09-20 LAB — GLUCOSE, CAPILLARY
GLUCOSE-CAPILLARY: 125 mg/dL — AB (ref 65–99)
GLUCOSE-CAPILLARY: 126 mg/dL — AB (ref 65–99)
GLUCOSE-CAPILLARY: 137 mg/dL — AB (ref 65–99)
Glucose-Capillary: 212 mg/dL — ABNORMAL HIGH (ref 65–99)

## 2017-09-20 LAB — BASIC METABOLIC PANEL
Anion gap: 11 (ref 5–15)
BUN: 59 mg/dL — AB (ref 6–20)
CHLORIDE: 107 mmol/L (ref 101–111)
CO2: 23 mmol/L (ref 22–32)
CREATININE: 1.95 mg/dL — AB (ref 0.61–1.24)
Calcium: 8.6 mg/dL — ABNORMAL LOW (ref 8.9–10.3)
GFR calc Af Amer: 33 mL/min — ABNORMAL LOW (ref >60)
GFR calc non Af Amer: 29 mL/min — ABNORMAL LOW (ref >60)
GLUCOSE: 147 mg/dL — AB (ref 65–99)
POTASSIUM: 4.3 mmol/L (ref 3.5–5.1)
SODIUM: 141 mmol/L (ref 135–145)

## 2017-09-20 NOTE — Progress Notes (Signed)
Progress Note  Patient Name: Ronald Lindsey Date of Encounter: 09/20/2017  Primary Cardiologist: Dr. Charlton Haws  Subjective   No shortness of breath, with some ambulation.  His legs are feeling better, wrapped yesterday he slept in the boots last night.  Inpatient Medications    Scheduled Meds: . aspirin EC  81 mg Oral Daily  . furosemide  40 mg Intravenous BID  . heparin  5,000 Units Subcutaneous Q8H  . hydrocerin   Topical Once per day on Mon Thu  . insulin aspart  0-9 Units Subcutaneous TID WC  . insulin aspart  3 Units Subcutaneous TID WC  . insulin glargine  15 Units Subcutaneous QHS  . lisinopril  5 mg Oral Daily  . multivitamin  1 tablet Oral BID  . sodium chloride flush  3 mL Intravenous Q12H   Continuous Infusions: . sodium chloride     PRN Meds: sodium chloride, acetaminophen, hydrALAZINE, ondansetron (ZOFRAN) IV, sodium chloride flush, traZODone   Vital Signs    Vitals:   09/19/17 0504 09/19/17 1230 09/19/17 2118 09/20/17 0418  BP: (!) 127/41 (!) 186/43 (!) 138/38 (!) 147/50  Pulse: (!) 40 (!) 44 90 62  Resp: 20 20 20 20   Temp: 98 F (36.7 C) 97.7 F (36.5 C) 98 F (36.7 C) 98.1 F (36.7 C)  TempSrc: Oral Oral Oral Oral  SpO2: 95% 97% 99% 94%  Weight: 254 lb 3.1 oz (115.3 kg)   248 lb 8 oz (112.7 kg)  Height:        Intake/Output Summary (Last 24 hours) at 09/20/2017 0909 Last data filed at 09/20/2017 0356 Gross per 24 hour  Intake 480 ml  Output 1650 ml  Net -1170 ml   Filed Weights   09/18/17 0612 09/19/17 0504 09/20/17 0418  Weight: 251 lb 8.7 oz (114.1 kg) 254 lb 3.1 oz (115.3 kg) 248 lb 8 oz (112.7 kg)    Telemetry    Sinus rhythm with intermittent Wenckebach block in 2:1 block, no pauses.  Personally reviewed.  Physical Exam   GEN:  Elderly male. No acute distress.   Neck: No JVD. Cardiac: RRR, no gallop.  Respiratory: Nonlabored.  Decreased breath sounds. GI: Soft, nontender, bowel sounds present. MS:  Legs dressed and  wrapped.  Less edema per nursing. Neuro:  Nonfocal.  Hearing loss. Psych: Alert and oriented x 3. Normal affect.  Labs    Chemistry Recent Labs  Lab 09/13/17 1622  09/18/17 0504 09/19/17 0451 09/20/17 0530  NA 141   < > 138 139 141  K 4.6   < > 4.3 4.4 4.3  CL 106   < > 107 108 107  CO2 27   < > 23 24 23   GLUCOSE 139*   < > 81 127* 147*  BUN 35*   < > 56* 62* 59*  CREATININE 1.85*   < > 2.07* 2.14* 1.95*  CALCIUM 8.8*   < > 8.5* 8.5* 8.6*  PROT 6.7  --   --   --   --   ALBUMIN 2.9*  --   --   --   --   AST 64*  --   --   --   --   ALT 34  --   --   --   --   ALKPHOS 243*  --   --   --   --   BILITOT 0.9  --   --   --   --   GFRNONAA 30*   < >  27* 26* 29*  GFRAA 35*   < > 31* 30* 33*  ANIONGAP 8   < > 8 7 11    < > = values in this interval not displayed.     Hematology Recent Labs  Lab 09/13/17 1622 09/19/17 0451  WBC 9.0 5.5  RBC 3.96* 3.52*  HGB 13.6 12.1*  HCT 40.9 36.9*  MCV 103.3* 104.8*  MCH 34.3* 34.4*  MCHC 33.3 32.8  RDW 15.1 15.3  PLT 156 163    Cardiac Enzymes Recent Labs  Lab 09/13/17 1622 09/13/17 2303 09/14/17 0446 09/14/17 1110  TROPONINI 0.04* 0.05* 0.06* 0.06*   No results for input(s): TROPIPOC in the last 168 hours.   BNP Recent Labs  Lab 09/13/17 1622  BNP 518.8*     Radiology    No results found.  Cardiac Studies   Echocardiogram 09/14/2017: Study Conclusions  - Left ventricle: The cavity size was normal. Wall thickness was   increased in a pattern of mild LVH. Systolic function was   vigorous. The estimated ejection fraction was in the range of 65%   to 70%. - Mitral valve: There was mild regurgitation.  Patient Profile     84 y.o. male with a history of hypertension, type 2 diabetes mellitus, macular degeneration, presenting with bilateral leg edema and ulcerations, also bradycardia with Wenckebach block and 2:1 block.  Assessment & Plan    1.  Bilateral leg edema and ulcerations.  Wound care team following.   He has had improvement with IV Lasix, also leg wraps and therapeutic boots.  He was on Lasix 80 mg twice daily as an outpatient.  2.  Bradycardia with Wenckebach block and intermittent 2:1 block.  Patient has been seen by EP, Dr. Elberta Fortisamnitz.  Ultimately pacemaker is planned once leg wounds have healed.  He is not on any AV nodal blockers at this time.  No pauses or syncope.  3. CKD, stage 3.  Creatinine 1.95, relatively stable.  4.  Essential hypertension.  Legs improving per discussion with nursing.  Would likely continue IV Lasix  today, had 1100 cc net out more than in last 24 hours and renal function stable.  Increase activity as tolerated.  Ideally, pacemaker would not be pursued until his leg wounds have healed completely to reduce chance of device infection.  Patient is already been scheduled to have an outpatient follow-up visit with Dr. Elberta Fortisamnitz.  Signed, Nona DellSamuel McDowell, MD  09/20/2017, 9:09 AM

## 2017-09-20 NOTE — Progress Notes (Signed)
PROGRESS NOTE  Ronald Lindsey ZOX:[id number] DOB: 04/30/1925 DOA: 09/13/2017 PCP: Renford Dills, MD  HPI/Recap of past 24 hours:  He is sitting up in chair, legs are wrapped with Unna boot Remain DOE, no cough, no chest pain bradycardia at night denies pain, no fever,    Assessment/Plan: Principal Problem:   Acute diastolic CHF (congestive heart failure), NYHA class 3 (HCC) Active Problems:   HTN (hypertension)   GERD (gastroesophageal reflux disease)   Renal failure   DM (diabetes mellitus) (HCC)   CHF (congestive heart failure) (HCC)   Mobitz (type) I (Wenckebach's) atrioventricular block   Acute on chronic distolic CHF (congestive heart failure), NYHA class II (HCC) - Patient had a elevated BNP of 518 in the ED, Chest x-ray reported pulmonary vascular congestion, -on Lasix 40 mg IV BID, ( he is on lasix 80mg  bid at home) -  .But remain edematous -cardiology to manage diuresis, will follow cardiology recommendation.  Bradycardia/Mobitz 1 - coreg discontinued -Follow cardiology and EP recommendation  Bilateral lower extremity edema Weeping open wound, does not appear infected Wound care consulted, Unna boots bilaterally recommended She will benefit from outpatient wound care follow-up as well  ckd III, cr stable at baseline - will need to differentiate between over diuresis versus cardiorenal syndrome if creatinine go up    insulin dependent DM (diabetes mellitus) (HCC) - had  hypoglycemic episodes despite drastic reduction in Lantus.  -he is on lantus 35units bid, currently on Lantus 15 units SQ qd . Continue on SSI, a.m. blood glucose today on 2/22 is 90. He does has postprandial hyperglycemia, will start meal cartridge - Diabetic diet.  - a1c  6.3   HTN (hypertension) -BP stable on IV Lasix , continue lisinopril. B blocker discontinued due to bradycardia     GERD (gastroesophageal reflux disease) - stable currently    Body mass index is 36.17  kg/m.  Code Status: full  Family Communication: patient   Disposition Plan: not ready to discharge, remain edematous, cardiology manage diuresis   Consultants:  cardiology  Procedures:  none  Antibiotics:  none   Objective: BP (!) 147/50 (BP Location: Right Arm)   Pulse 62   Temp 98.1 F (36.7 C) (Oral)   Resp 20   Ht 5' 9.5" (1.765 m)   Wt 112.7 kg (248 lb 8 oz)   SpO2 94%   BMI 36.17 kg/m   Intake/Output Summary (Last 24 hours) at 09/20/2017 0751 Last data filed at 09/20/2017 0356 Gross per 24 hour  Intake 480 ml  Output 1650 ml  Net -1170 ml   Filed Weights   09/18/17 0612 09/19/17 0504 09/20/17 0418  Weight: 114.1 kg (251 lb 8.7 oz) 115.3 kg (254 lb 3.1 oz) 112.7 kg (248 lb 8 oz)    Exam: Patient is examined daily including today on 09/20/2017, exams remain the same as of yesterday except that has changed    General:  NAD, pleasant, left eye legally blind  Cardiovascular: RRR  Respiratory: CTABL  Abdomen: Soft/ND/NT, positive BS  Musculoskeletal: bilateral lower extremity pitting Edema, now wrapped with small Unna boots  Neuro: alert, oriented   Data Reviewed: Basic Metabolic Panel: Recent Labs  Lab 09/13/17 2303  09/16/17 0505 09/17/17 0523 09/18/17 0504 09/19/17 0451 09/20/17 0530  NA  --    < > 140 139 138 139 141  K  --    < > 3.8 4.4 4.3 4.4 4.3  CL  --    < > 106 107  107 108 107  CO2  --    < > 24 24 23 24 23   GLUCOSE  --    < > 70 102* 81 127* 147*  BUN  --    < > 47* 55* 56* 62* 59*  CREATININE  --    < > 1.92* 2.07* 2.07* 2.14* 1.95*  CALCIUM  --    < > 8.7* 8.8* 8.5* 8.5* 8.6*  MG 2.1  --   --   --   --  2.4  --    < > = values in this interval not displayed.   Liver Function Tests: Recent Labs  Lab 09/13/17 1622  AST 64*  ALT 34  ALKPHOS 243*  BILITOT 0.9  PROT 6.7  ALBUMIN 2.9*   No results for input(s): LIPASE, AMYLASE in the last 168 hours. No results for input(s): AMMONIA in the last 168  hours. CBC: Recent Labs  Lab 09/13/17 1622 09/19/17 0451  WBC 9.0 5.5  NEUTROABS 4.4 2.5  HGB 13.6 12.1*  HCT 40.9 36.9*  MCV 103.3* 104.8*  PLT 156 163   Cardiac Enzymes:   Recent Labs  Lab 09/13/17 1622 09/13/17 2303 09/14/17 0446 09/14/17 1110  TROPONINI 0.04* 0.05* 0.06* 0.06*   BNP (last 3 results) Recent Labs    09/13/17 1622  BNP 518.8*    ProBNP (last 3 results) No results for input(s): PROBNP in the last 8760 hours.  CBG: Recent Labs  Lab 09/19/17 0751 09/19/17 1129 09/19/17 1719 09/19/17 2126 09/20/17 0725  GLUCAP 90 201* 246* 136* 125*    No results found for this or any previous visit (from the past 240 hour(s)).   Studies: No results found.  Scheduled Meds: . aspirin EC  81 mg Oral Daily  . furosemide  40 mg Intravenous BID  . heparin  5,000 Units Subcutaneous Q8H  . hydrocerin   Topical Once per day on Mon Thu  . insulin aspart  0-9 Units Subcutaneous TID WC  . insulin aspart  3 Units Subcutaneous TID WC  . insulin glargine  15 Units Subcutaneous QHS  . lisinopril  5 mg Oral Daily  . multivitamin  1 tablet Oral BID  . sodium chloride flush  3 mL Intravenous Q12H    Continuous Infusions: . sodium chloride       Time spent: 15mins I have personally reviewed and interpreted on  09/20/2017 daily labs, tele strips, imagings as discussed above under date review session and assessment and plans.  I reviewed all nursing notes, pharmacy notes, consultant notes,  vitals, pertinent old records  I have discussed plan of care as described above with RN , patient on 09/20/2017   Albertine GratesFang Jaxten Brosh MD, PhD  [facility] Pager [phone]. If 7PM-7AM, please contact night-coverage at [email or web address], password [email or web address] 09/20/2017, 7:51 AM  LOS: 7 days

## 2017-09-21 LAB — BASIC METABOLIC PANEL
ANION GAP: 10 (ref 5–15)
BUN: 53 mg/dL — ABNORMAL HIGH (ref 6–20)
CALCIUM: 9 mg/dL (ref 8.9–10.3)
CO2: 26 mmol/L (ref 22–32)
Chloride: 105 mmol/L (ref 101–111)
Creatinine, Ser: 2.01 mg/dL — ABNORMAL HIGH (ref 0.61–1.24)
GFR calc non Af Amer: 28 mL/min — ABNORMAL LOW (ref >60)
GFR, EST AFRICAN AMERICAN: 32 mL/min — AB (ref >60)
GLUCOSE: 118 mg/dL — AB (ref 65–99)
Potassium: 5.2 mmol/L — ABNORMAL HIGH (ref 3.5–5.1)
Sodium: 141 mmol/L (ref 135–145)

## 2017-09-21 LAB — GLUCOSE, CAPILLARY
GLUCOSE-CAPILLARY: 126 mg/dL — AB (ref 65–99)
GLUCOSE-CAPILLARY: 168 mg/dL — AB (ref 65–99)
Glucose-Capillary: 122 mg/dL — ABNORMAL HIGH (ref 65–99)
Glucose-Capillary: 145 mg/dL — ABNORMAL HIGH (ref 65–99)

## 2017-09-21 MED ORDER — TORSEMIDE 20 MG PO TABS
60.0000 mg | ORAL_TABLET | Freq: Two times a day (BID) | ORAL | Status: DC
  Administered 2017-09-21 – 2017-09-23 (×5): 60 mg via ORAL
  Filled 2017-09-21 (×5): qty 3

## 2017-09-21 NOTE — Progress Notes (Signed)
Progress Note  Patient Name: Ronald Lindsey Date of Encounter: 09/21/2017  Primary Cardiologist: Dr. Charlton HawsPeter Nishan  Subjective   No complaints of shortness of breath or chest pain.  No pain in his legs.  Legs remain wrapped and with therapeutic boots overnight.  Inpatient Medications    Scheduled Meds: . aspirin EC  81 mg Oral Daily  . furosemide  40 mg Intravenous BID  . heparin  5,000 Units Subcutaneous Q8H  . hydrocerin   Topical Once per day on Mon Thu  . insulin aspart  0-9 Units Subcutaneous TID WC  . insulin aspart  3 Units Subcutaneous TID WC  . insulin glargine  15 Units Subcutaneous QHS  . lisinopril  5 mg Oral Daily  . multivitamin  1 tablet Oral BID  . sodium chloride flush  3 mL Intravenous Q12H   Continuous Infusions: . sodium chloride     PRN Meds: sodium chloride, acetaminophen, hydrALAZINE, ondansetron (ZOFRAN) IV, sodium chloride flush, traZODone   Vital Signs    Vitals:   09/20/17 0418 09/20/17 1547 09/20/17 2018 09/21/17 0356  BP: (!) 147/50 (!) 141/38 (!) 156/66 (!) 151/46  Pulse: 62 98 (!) 116 (!) 51  Resp: 20 18 20  (!) 26  Temp: 98.1 F (36.7 C) 97.6 F (36.4 C) 97.6 F (36.4 C) 98.1 F (36.7 C)  TempSrc: Oral Oral Oral Oral  SpO2: 94% 98% 97% 95%  Weight: 248 lb 8 oz (112.7 kg)   245 lb 6.4 oz (111.3 kg)  Height:        Intake/Output Summary (Last 24 hours) at 09/21/2017 0846 Last data filed at 09/21/2017 0200 Gross per 24 hour  Intake 480 ml  Output 400 ml  Net 80 ml   Filed Weights   09/19/17 0504 09/20/17 0418 09/21/17 0356  Weight: 254 lb 3.1 oz (115.3 kg) 248 lb 8 oz (112.7 kg) 245 lb 6.4 oz (111.3 kg)    Telemetry    Sinus rhythm with intermittent Wenckebach block and 2:1 block, no prolonged pauses.  Personally reviewed.  Physical Exam   GEN:  Elderly male.  No acute distress.   Neck: No JVD. Cardiac: RRR, no gallop.  Respiratory: Nonlabored.  Decreased breath sounds. GI: Soft, nontender, bowel sounds present. MS:   Legs dressed and wrapped, currently in therapeutic boots. Neuro:  Nonfocal.  Hearing loss. Psych: Alert and oriented x 3. Normal affect.  Labs    Chemistry Recent Labs  Lab 09/19/17 0451 09/20/17 0530 09/21/17 0528  NA 139 141 141  K 4.4 4.3 5.2*  CL 108 107 105  CO2 24 23 26   GLUCOSE 127* 147* 118*  BUN 62* 59* 53*  CREATININE 2.14* 1.95* 2.01*  CALCIUM 8.5* 8.6* 9.0  GFRNONAA 26* 29* 28*  GFRAA 30* 33* 32*  ANIONGAP 7 11 10      Hematology Recent Labs  Lab 09/19/17 0451  WBC 5.5  RBC 3.52*  HGB 12.1*  HCT 36.9*  MCV 104.8*  MCH 34.4*  MCHC 32.8  RDW 15.3  PLT 163    Cardiac Enzymes Recent Labs  Lab 09/14/17 1110  TROPONINI 0.06*   No results for input(s): TROPIPOC in the last 168 hours.    Radiology    No results found.  Cardiac Studies   Echocardiogram 09/14/2017: Study Conclusions  - Left ventricle: The cavity size was normal. Wall thickness was increased in a pattern of mild LVH. Systolic function was vigorous. The estimated ejection fraction was in the range of 65% to  70%. - Mitral valve: There was mild regurgitation.  Patient Profile     84 y.o. male with a history of hypertension, type 2 diabetes mellitus, macular degeneration, presenting with bilateral leg edema and ulcerations, also bradycardia with Wenckebach block and 2:1 block.  Assessment & Plan    1.  Bilateral leg edema and ulcerations.  Continues with leg wraps and therapeutic foods.  Wound care team is following.  He has continued on IV Lasix, weight decreased but urine output collection incomplete.  2.  Bradycardia with Wenckebach block and intermittent 2:1 block.  Patient has been evaluated by Dr. Elberta Fortis and ultimately pacemaker is anticipated once leg wounds have healed.  He is not on any AV nodal blockers at this point.  3.  CKD stage 3, creatinine 2.0.  4.  Essential hypertension.  Today we will switch from IV Lasix to oral diuretics.  Since he was already on  Lasix 80 mg twice daily and this was not managing his leg swelling as an outpatient, we will try Demadex 60 mg twice daily.  Follow urine output, BMET in a.m.  Signed, Nona Dell, MD  09/21/2017, 8:46 AM

## 2017-09-21 NOTE — Progress Notes (Signed)
PROGRESS NOTE  Ronald Lindsey MVH:[id number] DOB: 16-Jul-1925 DOA: 09/13/2017 PCP: Renford Dills, MD  HPI/Recap of past 24 hours:  Urine output not accurately documented  He is sitting up in chair, legs are wrapped with Unna boot Remain DOE, no cough, no chest pain bradycardia is improving, now 40-50's at night denies pain, no fever,  Wife and caregiver at bedside    Assessment/Plan: Principal Problem:   Acute diastolic CHF (congestive heart failure), NYHA class 3 (HCC) Active Problems:   HTN (hypertension)   GERD (gastroesophageal reflux disease)   Renal failure   DM (diabetes mellitus) (HCC)   CHF (congestive heart failure) (HCC)   Mobitz (type) I (Wenckebach's) atrioventricular block   Acute on chronic distolic CHF (congestive heart failure), NYHA class II (HCC) - Patient had a elevated BNP of 518 in the ED, Chest x-ray reported pulmonary vascular congestion, -on Lasix 40 mg IV BID, ( he is on lasix 80mg  bid at home) -  .But remain edematous -cardiology to manage diuresis, will follow cardiology recommendation. Patient request to follow up with Dr Katrinka Blazing after discharge from the hospital.  Bradycardia/Mobitz 1 - coreg discontinued -Follow cardiology and EP recommendation  Bilateral lower extremity edema Weeping open wound, does not appear infected Wound care consulted, Unna boots bilaterally recommended She will benefit from outpatient wound care follow-up as well  CKD III, cr stable at baseline - will need to differentiate between over diuresis versus cardiorenal syndrome if creatinine go up   Insulin dependent DM (diabetes mellitus) (HCC) - had  hypoglycemic episodes initially in the hospital  -on lantus 35units bid at home, currently on Lantus 15 units SQ qd . Continue on SSI, a.m.  - Diabetic diet.  - a1c  6.3   HTN (hypertension) -BP stable diuretics and  lisinopril (reduced from 10 to 5mg  to give room for diuresis) -avoid  B blocker due to  bradycardia     GERD (gastroesophageal reflux disease) - stable currently    Body mass index is 35.72 kg/m.  Code Status: full  Family Communication: patient   Disposition Plan: not ready to discharge, remain edematous, cardiology managing diuresis   Consultants:  Cardiology  EP  Wound  Care  PT/OT  Procedures:  none  Antibiotics:  none   Objective: BP (!) 151/46 (BP Location: Right Arm)   Pulse (!) 51   Temp 98.1 F (36.7 C) (Oral)   Resp (!) 26   Ht 5' 9.5" (1.765 m)   Wt 111.3 kg (245 lb 6.4 oz)   SpO2 95%   BMI 35.72 kg/m   Intake/Output Summary (Last 24 hours) at 09/21/2017 0737 Last data filed at 09/21/2017 0200 Gross per 24 hour  Intake 480 ml  Output -  Net 480 ml   Filed Weights   09/19/17 0504 09/20/17 0418 09/21/17 0356  Weight: 115.3 kg (254 lb 3.1 oz) 112.7 kg (248 lb 8 oz) 111.3 kg (245 lb 6.4 oz)    Exam: Patient is examined daily including today on 09/21/2017, exams remain the same as of yesterday except that has changed    General:  NAD, pleasant, left eye legally blind  Cardiovascular: RRR  Respiratory: CTABL  Abdomen: Soft/ND/NT, positive BS  Musculoskeletal: bilateral lower extremity pitting Edema, now wrapped with small Unna boots  Neuro: alert, oriented   Data Reviewed: Basic Metabolic Panel: Recent Labs  Lab 09/16/17 0505 09/17/17 0523 09/18/17 0504 09/19/17 0451 09/20/17 0530  NA 140 139 138 139 141  K 3.8 4.4 4.3  4.4 4.3  CL 106 107 107 108 107  CO2 24 24 23 24 23   GLUCOSE 70 102* 81 127* 147*  BUN 47* 55* 56* 62* 59*  CREATININE 1.92* 2.07* 2.07* 2.14* 1.95*  CALCIUM 8.7* 8.8* 8.5* 8.5* 8.6*  MG  --   --   --  2.4  --    Liver Function Tests: No results for input(s): AST, ALT, ALKPHOS, BILITOT, PROT, ALBUMIN in the last 168 hours. No results for input(s): LIPASE, AMYLASE in the last 168 hours. No results for input(s): AMMONIA in the last 168 hours. CBC: Recent Labs  Lab 09/19/17 0451  WBC  5.5  NEUTROABS 2.5  HGB 12.1*  HCT 36.9*  MCV 104.8*  PLT 163   Cardiac Enzymes:   Recent Labs  Lab 09/14/17 1110  TROPONINI 0.06*   BNP (last 3 results) Recent Labs    09/13/17 1622  BNP 518.8*    ProBNP (last 3 results) No results for input(s): PROBNP in the last 8760 hours.  CBG: Recent Labs  Lab 09/20/17 0725 09/20/17 1153 09/20/17 1734 09/20/17 2034 09/21/17 0728  GLUCAP 125* 212* 126* 137* 126*    No results found for this or any previous visit (from the past 240 hour(s)).   Studies: No results found.  Scheduled Meds: . aspirin EC  81 mg Oral Daily  . furosemide  40 mg Intravenous BID  . heparin  5,000 Units Subcutaneous Q8H  . hydrocerin   Topical Once per day on Mon Thu  . insulin aspart  0-9 Units Subcutaneous TID WC  . insulin aspart  3 Units Subcutaneous TID WC  . insulin glargine  15 Units Subcutaneous QHS  . lisinopril  5 mg Oral Daily  . multivitamin  1 tablet Oral BID  . sodium chloride flush  3 mL Intravenous Q12H    Continuous Infusions: . sodium chloride       Time spent: 25mins I have personally reviewed and interpreted on  09/21/2017 daily labs, tele strips, imagings as discussed above under date review session and assessment and plans.  I reviewed all nursing notes, pharmacy notes, consultant notes,  vitals, pertinent old records  I have discussed plan of care as described above with RN , patient  And wife at bedside on 09/21/2017   Albertine GratesFang Dwana Garin MD, PhD  [facility] Pager [phone]. If 7PM-7AM, please contact night-coverage at [email or web address], password [facility] 09/21/2017, 7:37 AM  LOS: 8 days

## 2017-09-22 ENCOUNTER — Telehealth: Payer: Self-pay | Admitting: *Deleted

## 2017-09-22 DIAGNOSIS — N179 Acute kidney failure, unspecified: Secondary | ICD-10-CM

## 2017-09-22 DIAGNOSIS — N184 Chronic kidney disease, stage 4 (severe): Secondary | ICD-10-CM

## 2017-09-22 LAB — COMPREHENSIVE METABOLIC PANEL
ALBUMIN: 2.8 g/dL — AB (ref 3.5–5.0)
ALK PHOS: 207 U/L — AB (ref 38–126)
ALT: 28 U/L (ref 17–63)
ANION GAP: 9 (ref 5–15)
AST: 38 U/L (ref 15–41)
BILIRUBIN TOTAL: 0.9 mg/dL (ref 0.3–1.2)
BUN: 57 mg/dL — AB (ref 6–20)
CO2: 25 mmol/L (ref 22–32)
Calcium: 9 mg/dL (ref 8.9–10.3)
Chloride: 107 mmol/L (ref 101–111)
Creatinine, Ser: 2.06 mg/dL — ABNORMAL HIGH (ref 0.61–1.24)
GFR calc Af Amer: 31 mL/min — ABNORMAL LOW (ref >60)
GFR calc non Af Amer: 27 mL/min — ABNORMAL LOW (ref >60)
GLUCOSE: 119 mg/dL — AB (ref 65–99)
Potassium: 4.8 mmol/L (ref 3.5–5.1)
SODIUM: 141 mmol/L (ref 135–145)
TOTAL PROTEIN: 6.4 g/dL — AB (ref 6.5–8.1)

## 2017-09-22 LAB — GLUCOSE, CAPILLARY
GLUCOSE-CAPILLARY: 170 mg/dL — AB (ref 65–99)
Glucose-Capillary: 83 mg/dL (ref 65–99)
Glucose-Capillary: 148 mg/dL — ABNORMAL HIGH (ref 65–99)
Glucose-Capillary: 108 mg/dL — ABNORMAL HIGH (ref 65–99)

## 2017-09-22 LAB — CBC
HEMATOCRIT: 40.3 % (ref 39.0–52.0)
HEMOGLOBIN: 13.1 g/dL (ref 13.0–17.0)
MCH: 34.4 pg — ABNORMAL HIGH (ref 26.0–34.0)
MCHC: 32.5 g/dL (ref 30.0–36.0)
MCV: 105.8 fL — ABNORMAL HIGH (ref 78.0–100.0)
Platelets: 155 10*3/uL (ref 150–400)
RBC: 3.81 MIL/uL — AB (ref 4.22–5.81)
RDW: 15.3 % (ref 11.5–15.5)
WBC: 5.5 10*3/uL (ref 4.0–10.5)

## 2017-09-22 LAB — BRAIN NATRIURETIC PEPTIDE: B Natriuretic Peptide: 488.5 pg/mL — ABNORMAL HIGH (ref 0.0–100.0)

## 2017-09-22 MED ORDER — HYDROCERIN EX CREA
TOPICAL_CREAM | CUTANEOUS | Status: DC
  Administered 2017-09-23: 09:00:00 via TOPICAL
  Filled 2017-09-22: qty 113

## 2017-09-22 MED ORDER — TRAZODONE HCL 50 MG PO TABS
50.0000 mg | ORAL_TABLET | Freq: Every evening | ORAL | Status: DC | PRN
  Administered 2017-09-22: 50 mg via ORAL
  Filled 2017-09-22: qty 1

## 2017-09-22 NOTE — Clinical Social Work Note (Signed)
Clinical Social Work Assessment  Patient Details  Name: Ronald Lindsey MRN: [id number] Date of Birth: [birth year]  Date of referral:  09/22/17               Reason for consult:  Facility Placement                Permission sought to share information with:  Facility Industrial/product designerContact Representative Permission granted to share information::  Yes, Verbal Permission Granted  Name::     Ronald Lindsey; Ronald Lindsey   Agency::     Relationship::  Wife; Ronald DiamondFriend  Contact Information:  [phone];; [phone]  Housing/Transportation Living arrangements for the past 2 months:  Single Family Home Source of Information:  Patient, Spouse, Friend/Neighbor Patient Interpreter Needed:  None Criminal Activity/Legal Involvement Pertinent to Current Situation/Hospitalization:  No - Comment as needed Significant Relationships:  Spouse Lives with:  Spouse Do you feel safe going back to the place where you live?  Yes Need for family participation in patient care:  Yes (Comment)  Care giving concerns:  Patient from home with wife. Patient's wife reported that patient is independent with ambulation and ADLs at baseline. Patient's wife reported that patient can no longer drive due to visual impairment. PT recommending SNF.    Social Worker assessment / plan:  CSW spoke with patient/patient's wife/patient's friend at bedside regarding PT recommendation for SNF. Patient's wife reported that they are interested in [facility]. Patient's wife reported that if [facility]F is not available that patient will dc home.  CSW completed FL2 and will follow up with [facility].  CSW will continue to follow and assist with discharge planning.   Employment status:  Retired Database administratornsurance information:  Managed Medicare PT Recommendations:  Skilled Nursing Facility Information / Referral to community resources:  Skilled Nursing Facility  Patient/Family's Response to care: Patient/patient's wife appreciative of CSW  assistance with discharge planning.   Patient/Family's Understanding of and Emotional Response to Diagnosis, Current Treatment, and Prognosis:  Patient presented pleasant and deferred to wife to discuss SNF placement. Patient's wife involved in patient's care and reported that they are only interested in [facility] due to the close proximity to their home. Patient's wife reported that patient will dc home if [facility] is not available, patient agreeable.   Emotional Assessment Appearance:  Appears younger than stated age Attitude/Demeanor/Rapport:  Other(Cooperative) Affect (typically observed):  Pleasant Orientation:  Oriented to Self, Oriented to Place, Oriented to  Time, Oriented to Situation Alcohol / Substance use:  Not Applicable Psych involvement (Current and /or in the community):  No (Comment)  Discharge Needs  Concerns to be addressed:  Care Coordination Readmission within the last 30 days:  No Current discharge risk:  Physical Impairment Barriers to Discharge:  Continued Medical Work up   USG CorporationKimberly L Mehar Kirkwood, LCSW 09/22/2017, 3:17 PM

## 2017-09-22 NOTE — Progress Notes (Addendum)
The patient has been seen in conjunction with Berton Bon, NP-C. All aspects of care have been considered and discussed. The patient has been personally interviewed, examined, and all clinical data has been reviewed.   Still having exertional fatigue and dyspnea.  Has lost 18 pounds with in-hospital diuresis.  Torsemide 60 mg twice daily is currently being given.  This is a much more potent regimen than initial presentation which was furosemide 80 a.m. and 40 p.m.  Kidney function is stable over last 5 days.  Would continue current dose of torsemide.  Will need daily weights at discharge to monitor volume status and help adjust diuretic regimen as required.  I do believe bradycardia is contributing to weakness, dyspnea and fatigue.  We need to make sure that follow-up appointment with EP occurs.  At discharge will need to have continued care of lower extremity wound.  Agree with several week rehab stay to improve functional status before returning home.  Progress Note  Patient Name: Ronald Lindsey Date of Encounter: 09/22/2017  Primary Cardiologist: Charlton Haws, MD   Subjective   Breathing much better than on admission, but still with mild shortness of breath ambulating to the bathroom. Has not been up walking in the halls. He feels like he has had at least as much urine output with the demadex as the IV lasix.   Inpatient Medications    Scheduled Meds: . aspirin EC  81 mg Oral Daily  . heparin  5,000 Units Subcutaneous Q8H  . hydrocerin   Topical Once per day on Mon Thu  . insulin aspart  0-9 Units Subcutaneous TID WC  . insulin aspart  3 Units Subcutaneous TID WC  . insulin glargine  15 Units Subcutaneous QHS  . lisinopril  5 mg Oral Daily  . multivitamin  1 tablet Oral BID  . sodium chloride flush  3 mL Intravenous Q12H  . torsemide  60 mg Oral BID   Continuous Infusions: . sodium chloride     PRN Meds: sodium chloride, acetaminophen, hydrALAZINE, ondansetron  (ZOFRAN) IV, sodium chloride flush, traZODone   Vital Signs    Vitals:   09/21/17 1300 09/21/17 2117 09/22/17 0343 09/22/17 0518  BP: (!) 152/52 (!) 159/55  (!) 136/50  Pulse: (!) 54 (!) 40  (!) 47  Resp: 18 20  18   Temp: (!) 97.4 F (36.3 C) 97.8 F (36.6 C)  97.9 F (36.6 C)  TempSrc: Oral Oral  Oral  SpO2: 97% 97%  96%  Weight:   240 lb 12.8 oz (109.2 kg)   Height:        Intake/Output Summary (Last 24 hours) at 09/22/2017 0726 Last data filed at 09/21/2017 2121 Gross per 24 hour  Intake -  Output 1150 ml  Net -1150 ml   Filed Weights   09/20/17 0418 09/21/17 0356 09/22/17 0343  Weight: 248 lb 8 oz (112.7 kg) 245 lb 6.4 oz (111.3 kg) 240 lb 12.8 oz (109.2 kg)    Telemetry    Wenckebach with rates in the 40's.  - Personally Reviewed  ECG    No new tracings - Personally Reviewed  Physical Exam   GEN: Elderly male, No acute distress.   Neck: No JVD Cardiac: Slow irregular rhythm, no murmurs, rubs, or gallops.  Respiratory: Clear to auscultation bilaterally, dminished GI: Soft, nontender, rounded abdomen MS: Bilateral lower legs wrapped, unable to assess degree of edema Neuro:  Nonfocal  Psych: Normal affect   Labs    Chemistry Recent  Labs  Lab 09/20/17 0530 09/21/17 0528 09/22/17 0522  NA 141 141 141  K 4.3 5.2* 4.8  CL 107 105 107  CO2 23 26 25   GLUCOSE 147* 118* 119*  BUN 59* 53* 57*  CREATININE 1.95* 2.01* 2.06*  CALCIUM 8.6* 9.0 9.0  PROT  --   --  6.4*  ALBUMIN  --   --  2.8*  AST  --   --  38  ALT  --   --  28  ALKPHOS  --   --  207*  BILITOT  --   --  0.9  GFRNONAA 29* 28* 27*  GFRAA 33* 32* 31*  ANIONGAP 11 10 9      Hematology Recent Labs  Lab 09/19/17 0451 09/22/17 0522  WBC 5.5 5.5  RBC 3.52* 3.81*  HGB 12.1* 13.1  HCT 36.9* 40.3  MCV 104.8* 105.8*  MCH 34.4* 34.4*  MCHC 32.8 32.5  RDW 15.3 15.3  PLT 163 155    Cardiac EnzymesNo results for input(s): TROPONINI in the last 168 hours. No results for input(s): TROPIPOC  in the last 168 hours.   BNP Recent Labs  Lab 09/22/17 0522  BNP 488.5*     DDimer No results for input(s): DDIMER in the last 168 hours.   Radiology    No results found.  Cardiac Studies   Lower extremity venous dopplers 09/18/17 Right: There is no evidence of deep vein thrombosis in the lower extremity. No cystic structure found in the popliteal fossa. Left: There is no evidence of deep vein thrombosis in the lower extremity. No cystic structure found in the popliteal fossa.  Echocardiogram 09/14/17 Study Conclusions  - Left ventricle: The cavity size was normal. Wall thickness was   increased in a pattern of mild LVH. Systolic function was   vigorous. The estimated ejection fraction was in the range of 65% to 70%. - Mitral valve: There was mild regurgitation.  Patient Profile     84 y.o. male with a history of hypertension, type 2 diabetes mellitus, macular degeneration, presenting with bilateral leg edema and ulcerations, also bradycardia with Wenckebach block and 2:1 block.  Assessment & Plan    Acute diastolic heart failure -Has been diuresed with lasix 40 mg IV BID. Switched to demadex 60 mg BID yesterday as his home lasix 80 mg bid was not doing an adequate job of fluid removal. Pt thinks that he has had at least as much urine output with the demadex yesterday as he had with the IV lasix the day before. Pt advised to take his diuretic at home in the morning and early afternoon instead of at night to allow for better continuation of morning dose. -1.1L UOP since yesterday. Net negative 3.4L fluid balance since admission. (UOP collection not likely complete) Wt is down 18 pounds from max 258>>240 lbs.  -Breathing is much better than on admission, but still has mild DOE with walking to the bathroom.  -Dr. Katrinka Blazing to see pt and advise if ready to go home.  -Will arrange early [facility] office follow up with labs.   Lower extremity edema with ulcerations -Wound care team  following. Legs wrapped and therapeutic diet.   Mobitz 1 AVB -HR 40's and down to 20's-30's at night. Intermittent Wenckebach with 2:1 block.  -Evaluated by Dr. Elberta Fortis for consideration of PPM. Not optimal time to place PPM due to leg wounds. Pt has EP outpatient follow up arranged.  -Avoiding AV nodal blocking agents.  CKD stage 3 -SCr  stable at 2.06. Will need BMet at follow up.   Hypertension  -Lisinopril reduced to allow for diuresis. -BP elevated yesterday, improved this am.   DM -On insulin. A1c 6.3. Management per IM  For questions or updates, please contact [facility] Please consult [email or web address] for contact info under Cardiology/STEMI.      Signed, Berton BonJanine Hammond, NP  09/22/2017, 7:26 AM

## 2017-09-22 NOTE — Evaluation (Signed)
Physical Therapy Evaluation Patient Details Name: Ronald MaroonReid D Rogalski MRN: [id number] DOB: [birth year] Today's Date: 09/22/2017   History of Present Illness  [facility] with SOB and leg swelling. He has been found to have CHF and irregular HR and will have eventual pacemaker. He as been under treatment.  He also has a wound on his left let that is being treated with unna boot. Past hx included diabetes, renal failure and HTN  Clinical Impression  This very pleasant 84 year old is deconditioned from prolonged hospitalization with medical treatment.  His 82 yo  wife is not able to provide physical assist at home. I feel he has potential to increase strength and functional safety for independence to return to home after a short term stay at SNF.  Wife prefers [facility] as it is very close to their home      Follow Up Recommendations SNF    Equipment Recommendations       Recommendations for Other Services       Precautions / Restrictions Precautions Precautions: Fall Restrictions Weight Bearing Restrictions: No      Mobility  Bed Mobility Overal bed mobility: Needs Assistance Bed Mobility: Rolling;Sidelying to Sit;Sit to Sidelying Rolling: Min guard Sidelying to sit: Min guard(definite use of bedrails )     Sit to sidelying: Min guard(needs bedrails and extra time, assist to bring legs to bed ) General bed mobility comments: Pt needs extra time, needs to grab on to bedrails,  Transfers Overall transfer level: Needs assistance Equipment used: Rolling walker (2 wheeled)             General transfer comment: needs frequent cues to push up with hands, needs extra time   Ambulation/Gait Ambulation/Gait assistance: Min guard Ambulation Distance (Feet): 200 Feet Assistive device: Rolling walker (2 wheeled)(pt would benefit from a wide walker ) Gait Pattern/deviations: Step-through pattern     General Gait Details: without RW pt has decrased gait pattern with  more wide based, shuffle and increasede arm swing   Stairs            Wheelchair Mobility    Modified Rankin (Stroke Patients Only)       Balance Overall balance assessment: Needs assistance(needs close supervision, occasional min guard ) Sitting-balance support: Single extremity supported;Feet supported       Standing balance support: During functional activity(pt able to wash hands at sink)                                 Pertinent Vitals/Pain Pain Assessment: No/denies pain    Home Living Family/patient expects to be discharged to:: Private residence Living Arrangements: Spouse/significant other;Non-relatives/Friends(wife uses a cane , ) Available Help at Discharge: Family;Available 24 hours/day(caregiver that lives close by [name]( [name]) ) Type of Home: House Home Access: Stairs to enter Entrance Stairs-Rails: Can reach both Entrance Stairs-Number of Steps: 4 Home Layout: One level Home Equipment: Walker - 2 wheels;Cane - single point;Grab bars - toilet;Grab bars - tub/shower Additional Comments: wife is not able to physically assist     Prior Function Level of Independence: Independent               Hand Dominance        Extremity/Trunk Assessment   Upper Extremity Assessment Upper Extremity Assessment: Generalized weakness    Lower Extremity Assessment Lower Extremity Assessment: Overall WFL for tasks assessed    Cervical / Trunk  Assessment Cervical / Trunk Assessment: Kyphotic  Communication   Communication: No difficulties  Cognition Arousal/Alertness: Awake/alert Behavior During Therapy: WFL for tasks assessed/performed                                   General Comments: pt very pleasant and cooperative , wants to be back to independence at home for ease for wife       General Comments General comments (skin integrity, edema, etc.): Pt is only able to do 3 reps of sit to stand in 30 seconds with need for  frequent cues to push with hands for safety, O2 sat on RA at rest 98 and after ambulation on RA is 95.  Pt has bilateral LE unna boot     Exercises     Assessment/Plan    PT Assessment Patient needs continued PT services  PT Problem List Decreased strength;Decreased activity tolerance;Decreased balance;Decreased mobility;Decreased knowledge of use of DME;Obesity       PT Treatment Interventions DME instruction;Gait training;Stair training;Functional mobility training;Therapeutic activities;Therapeutic exercise;Balance training;Patient/family education    PT Goals (Current goals can be found in the Care Plan section)  Acute Rehab PT Goals Patient Stated Goal: to get back to where he was prior to admission PT Goal Formulation: With patient/family Time For Goal Achievement: 10/06/17 Potential to Achieve Goals: Good    Frequency Min 2X/week   Barriers to discharge Decreased caregiver support wife is not able to physically assist.  Wife requesting [facility] for short term rehab and pt agrees     Co-evaluation               AM-PAC PT "6 Clicks" Daily Activity  Outcome Measure Difficulty turning over in bed (including adjusting bedclothes, sheets and blankets)?: A Little Difficulty moving from lying on back to sitting on the side of the bed? : A Little Difficulty sitting down on and standing up from a chair with arms (e.g., wheelchair, bedside commode, etc,.)?: A Little Help needed moving to and from a bed to chair (including a wheelchair)?: A Little Help needed walking in hospital room?: A Little Help needed climbing 3-5 steps with a railing? : A Lot 6 Click Score: 17    End of Session Equipment Utilized During Treatment: Gait belt Activity Tolerance: Patient tolerated treatment well Patient left: in chair;with family/visitor present   PT Visit Diagnosis: Unsteadiness on feet (R26.81);Muscle weakness (generalized) (M62.81)    Time: 1610-9604 PT Time Calculation (min)  (ACUTE ONLY): 29 min   Charges:   PT Evaluation $PT Eval Low Complexity: 1 Low     PT G Codes:        Teresa K. Manson Passey, PT   Donnetta Hail 09/22/2017, 12:12 PM

## 2017-09-22 NOTE — Progress Notes (Signed)
PROGRESS NOTE  Ronald Lindsey RUE:[id number] DOB: 08-27-1924 DOA: 09/13/2017 PCP: Renford DillsPolite, Ronald, MD  HPI/Recap of past 24 hours:  1.1 liter Urine output  Documented last 24hrs   legs are wrapped with Unna boot Remain DOE, no cough, no chest pain bradycardia is improving, now 40-50's at night denies pain, no fever,    Assessment/Plan: Principal Problem:   Acute diastolic CHF (congestive heart failure), NYHA class 3 (HCC) Active Problems:   HTN (hypertension)   GERD (gastroesophageal reflux disease)   Renal failure   DM (diabetes mellitus) (HCC)   CHF (congestive heart failure) (HCC)   Mobitz (type) I (Wenckebach's) atrioventricular block   Acute on chronic distolic CHF (congestive heart failure), NYHA class II (HCC) - Patient had a elevated BNP of 518 in the ED, Chest x-ray reported pulmonary vascular congestion, -on Lasix 40 mg IV BID, ( he is on lasix 80mg  bid at home), diuretic changed to demadex 60mg  bid per cardiology recommendation - remain edematous, remain c/o DOE -cardiology to manage diuresis, will follow cardiology recommendation. Patient request to follow up with Dr Katrinka BlazingSmith after discharge from the hospital.  Bradycardia/Mobitz 1 - coreg discontinued -Follow cardiology and EP recommendation  Bilateral lower extremity edema Venous doppler negative for dvt Weeping open wound, does not appear infected Wound care consulted, Unna boots bilaterally recommended She will benefit from outpatient wound care follow-up as well  CKD III, cr stable at baseline - will need to differentiate between over diuresis versus cardiorenal syndrome if creatinine go up   Insulin dependent DM (diabetes mellitus) (HCC) - had  hypoglycemic episodes initially in the hospital  -on lantus 35units bid at home, currently on Lantus 15 units SQ qd . Continue on SSI, a.m.  - Diabetic diet.  - a1c  6.3   HTN (hypertension) -BP stable diuretics and  lisinopril (reduced from 10 to 5mg  to  give room for diuresis) -avoid  B blocker due to bradycardia     GERD (gastroesophageal reflux disease) - stable currently    Body mass index is 35.05 kg/m.  Code Status: full  Family Communication: patient   Disposition Plan: to SNF, need cardiology clearance   Consultants:  Cardiology  EP  Wound  Care  PT/OT  Procedures:  none  Antibiotics:  none   Objective: BP (!) 145/46 (BP Location: Right Arm)   Pulse (!) 107   Temp 98.7 F (37.1 C) (Oral)   Resp 20   Ht 5' 9.5" (1.765 m)   Wt 109.2 kg (240 lb 12.8 oz)   SpO2 97%   BMI 35.05 kg/m   Intake/Output Summary (Last 24 hours) at 09/22/2017 1548 Last data filed at 09/22/2017 1328 Gross per 24 hour  Intake 800 ml  Output 1050 ml  Net -250 ml   Filed Weights   09/20/17 0418 09/21/17 0356 09/22/17 0343  Weight: 112.7 kg (248 lb 8 oz) 111.3 kg (245 lb 6.4 oz) 109.2 kg (240 lb 12.8 oz)    Exam: Patient is examined daily including today on 09/22/2017, exams remain the same as of yesterday except that has changed    General:  NAD, pleasant, left eye legally blind  Cardiovascular: RRR  Respiratory: CTABL  Abdomen: Soft/ND/NT, positive BS  Musculoskeletal: bilateral lower extremity pitting Edema, now wrapped with small Unna boots  Neuro: alert, oriented   Data Reviewed: Basic Metabolic Panel: Recent Labs  Lab 09/18/17 0504 09/19/17 0451 09/20/17 0530 09/21/17 0528 09/22/17 0522  NA 138 139 141 141 141  K  4.3 4.4 4.3 5.2* 4.8  CL 107 108 107 105 107  CO2 23 24 23 26 25   GLUCOSE 81 127* 147* 118* 119*  BUN 56* 62* 59* 53* 57*  CREATININE 2.07* 2.14* 1.95* 2.01* 2.06*  CALCIUM 8.5* 8.5* 8.6* 9.0 9.0  MG  --  2.4  --   --   --    Liver Function Tests: Recent Labs  Lab 09/22/17 0522  AST 38  ALT 28  ALKPHOS 207*  BILITOT 0.9  PROT 6.4*  ALBUMIN 2.8*   No results for input(s): LIPASE, AMYLASE in the last 168 hours. No results for input(s): AMMONIA in the last 168  hours. CBC: Recent Labs  Lab 09/19/17 0451 09/22/17 0522  WBC 5.5 5.5  NEUTROABS 2.5  --   HGB 12.1* 13.1  HCT 36.9* 40.3  MCV 104.8* 105.8*  PLT 163 155   Cardiac Enzymes:   No results for input(s): CKTOTAL, CKMB, CKMBINDEX, TROPONINI in the last 168 hours. BNP (last 3 results) Recent Labs    09/13/17 1622 09/22/17 0522  BNP 518.8* 488.5*    ProBNP (last 3 results) No results for input(s): PROBNP in the last 8760 hours.  CBG: Recent Labs  Lab 09/21/17 1150 09/21/17 1658 09/21/17 2115 09/22/17 0733 09/22/17 1156  GLUCAP 122* 168* 145* 83 170*    No results found for this or any previous visit (from the past 240 hour(s)).   Studies: No results found.  Scheduled Meds: . aspirin EC  81 mg Oral Daily  . heparin  5,000 Units Subcutaneous Q8H  . [START ON 09/23/2017] hydrocerin   Topical Once per day on Tue Fri  . insulin aspart  0-9 Units Subcutaneous TID WC  . insulin aspart  3 Units Subcutaneous TID WC  . insulin glargine  15 Units Subcutaneous QHS  . lisinopril  5 mg Oral Daily  . multivitamin  1 tablet Oral BID  . sodium chloride flush  3 mL Intravenous Q12H  . torsemide  60 mg Oral BID    Continuous Infusions: . sodium chloride       Time spent: I have personally reviewed and interpreted on  09/22/2017 daily labs, tele strips, imagings as discussed above under date review session and assessment and plans.  I reviewed all nursing notes, pharmacy notes, consultant notes,  vitals, pertinent old records  I have discussed plan of care as described above with RN , patient   on 09/22/2017   Albertine Grates MD, PhD  [facility] Pager [phone]. If 7PM-7AM, please contact night-coverage at [email or web address], password [facility] 09/22/2017, 3:48 PM  LOS: 9 days

## 2017-09-22 NOTE — NC FL2 (Signed)
MEDICAID FL2 LEVEL OF CARE SCREENING TOOL     IDENTIFICATION  Patient Name: Ronald Lindsey Birthdate: August 06, 1924 Sex: male Admission Date (Current Location): 09/13/2017  [facility] and [place] Number:  Producer, television/film/video and Address:  [facility],  [address] [place] [address]      Provider Number: [id number]  Attending Physician Name and Address:  Albertine Grates, MD  Relative Name and Phone Number:       Current Level of Care: Hospital Recommended Level of Care: Skilled Nursing Facility Prior Approval Number:    Date Approved/Denied:   PASRR Number: [id number]  Discharge Plan: SNF    Current Diagnoses: Patient Active Problem List   Diagnosis Date Noted  . Mobitz (type) I (Wenckebach's) atrioventricular block 09/16/2017  . Acute diastolic CHF (congestive heart failure), NYHA class 3 (HCC) 09/13/2017  . CHF (congestive heart failure) (HCC) 09/13/2017  . Pleural effusion 05/08/2015  . Gastroenteritis 11/06/2011  . Renal failure 11/06/2011  . DM (diabetes mellitus) (HCC) 11/06/2011  . Previous back surgery 11/06/2011  . S/P appendectomy 11/06/2011  . Cellulitis of right leg 11/06/2011  . Diabetic retinopathy   . Macular degeneration   . Diabetic neuropathy (HCC)   . HTN (hypertension)   . GERD (gastroesophageal reflux disease)   . Back pain     Orientation RESPIRATION BLADDER Height & Weight     Self, Time, Situation, Place  Normal Continent Weight: 240 lb 12.8 oz (109.2 kg) Height:  5' 9.5" (176.5 cm)  BEHAVIORAL SYMPTOMS/MOOD NEUROLOGICAL BOWEL NUTRITION STATUS      Continent Diet(heart healthy, carb modified)  AMBULATORY STATUS COMMUNICATION OF NEEDS Skin   Limited Assist Verbally PU Stage and Appropriate Care   Patient with LE edema (bilaterally) and full thickness wound on LLE, medial aspect.  Small wound draining serous fluid on RLE.  Heels intact, sacrum intact.  Awaiting wound healing prior to pacemaker implantation.  Wound type:  venous insufficiency Pressure Injury POA: NA Measurement: Left LE: 4.2cm x 2.5cm x 0.2cm with ruddy, red appearance, no fresh exudate.  Smaller wound at 1-2 o'clock measures 2cm x 0.4cm x 0.1cm with serous exudate. RLE:  .05cm round x 0.1cm  partial thickness wound with serous exudate Wound bed:As describe dabove Drainage (amount, consistency, odor) As described above Periwound: edematous with erythema in LLE and dry, flaking skin on RLE.  Macular ash on posterior RLE covering a 12cm x 14cm area.  Patient denies itching or discomfort in this area. Dressing procedure/placement/frequency: We will dress wound with a topical antimicrobial (silver hydrofiber) and compress.  Initially I preferred a dry boot, but after consideration of urgency, agree with applying a zinc-past boot and changing twice weekly.  Patent should follow in the outpatient [facility] post discharge until healed. Expeditious healing is the priority due to patient needing to have a pacemaker implanted. Orders provided for nursing team for wound care. Additionally, I have included moisturizing with Eucerin cream prior to Unna's boot application as they tend to dry tissues, pressure redistribution heel boots for use in bed and a pressure redistribution chair cushion for OOB use to prevent pressure injury.                       Personal Care Assistance Level of Assistance  Bathing, Feeding, Dressing Bathing Assistance: Limited assistance Feeding assistance: Independent Dressing Assistance: Independent     Functional Limitations Info  Sight, Hearing, Speech Sight Info: Impaired(macular degeneration) Hearing Info: Adequate Speech Info: Adequate  SPECIAL CARE FACTORS FREQUENCY                       Contractures Contractures Info: Not present    Additional Factors Info  Code Status, Allergies Code Status Info: full code Allergies Info: nka           Current Medications (09/22/2017):  This is the current hospital  active medication list Current Facility-Administered Medications  Medication Dose Route Frequency Provider Last Rate Last Dose  . 0.9 %  sodium chloride infusion  250 mL Intravenous PRN Earlie LouGarba, Mohammad L, MD      . acetaminophen (TYLENOL) tablet 650 mg  650 mg Oral Q4H PRN Rometta EmeryGarba, Mohammad L, MD      . aspirin EC tablet 81 mg  81 mg Oral Daily Earlie LouGarba, Mohammad L, MD   81 mg at 09/22/17 0938  . heparin injection 5,000 Units  5,000 Units Subcutaneous Q8H Rometta EmeryGarba, Mohammad L, MD   5,000 Units at 09/22/17 1250  . hydrALAZINE (APRESOLINE) injection 10 mg  10 mg Intravenous Q6H PRN Albertine GratesXu, Fang, MD      . Melene Muller[START ON 09/23/2017] hydrocerin (EUCERIN) cream   Topical Once per day on Tue Fri Xu, Fang, MD      . insulin aspart (novoLOG) injection 0-9 Units  0-9 Units Subcutaneous TID WC Penny PiaVega, [place], MD   2 Units at 09/22/17 1249  . insulin aspart (novoLOG) injection 3 Units  3 Units Subcutaneous TID WC Albertine GratesXu, Fang, MD   3 Units at 09/22/17 1250  . insulin glargine (LANTUS) injection 15 Units  15 Units Subcutaneous QHS Penny PiaVega, [place], MD   15 Units at 09/21/17 2126  . lisinopril (PRINIVIL,ZESTRIL) tablet 5 mg  5 mg Oral Daily Albertine GratesXu, Fang, MD   5 mg at 09/22/17 [id number]  . multivitamin (PROSIGHT) tablet 1 tablet  1 tablet Oral BID Rometta EmeryGarba, Mohammad L, MD   1 tablet at 09/22/17 [id number]  . ondansetron (ZOFRAN) injection 4 mg  4 mg Intravenous Q6H PRN Mikeal HawthorneGarba, Mohammad L, MD      . sodium chloride flush (NS) 0.9 % injection 3 mL  3 mL Intravenous Q12H Rometta EmeryGarba, Mohammad L, MD   3 mL at 09/22/17 0939  . sodium chloride flush (NS) 0.9 % injection 3 mL  3 mL Intravenous PRN Rometta EmeryGarba, Mohammad L, MD      . torsemide (DEMADEX) tablet 60 mg  60 mg Oral BID Jonelle SidleMcDowell, Samuel G, MD   60 mg at 09/22/17 [id number]  . traZODone (DESYREL) tablet 50 mg  50 mg Oral QHS PRN Albertine GratesXu, Fang, MD         Discharge Medications: Please see discharge summary for a list of discharge medications.  Relevant Imaging Results:  Relevant Lab Results:   Additional  Information SS#[id number]  Nelwyn SalisburyMeghan R Shalee Paolo, LCSW

## 2017-09-22 NOTE — Evaluation (Signed)
Occupational Therapy Evaluation Patient Details Name: Ronald Lindsey MRN: [id number] DOB: [birth year] Today's Date: 09/22/2017    History of Present Illness [facility] with SOB and leg swelling. He has been found to have CHF and irregular HR and will have eventual pacemaker. He as been under treatment.  He also has a wound on his left let that is being treated with unna boot. Past hx included diabetes, renal failure and HTN   Clinical Impression   Pt admitted with SOB and BLE edema . Pt currently with functional limitations due to the deficits listed below (see OT Problem List).  Pt will benefit from skilled OT to increase their safety and independence with ADL and functional mobility for ADL to facilitate discharge to venue listed below.      Follow Up Recommendations  SNF    Equipment Recommendations  None recommended by OT           Mobility Bed Mobility Overal bed mobility: Needs Assistance Bed Mobility: Rolling;Sidelying to Sit;Sit to Sidelying Rolling: Min guard Sidelying to sit: Min guard(definite use of bedrails )     Sit to sidelying: Min guard(needs bedrails and extra time, assist to bring legs to bed ) General bed mobility comments: Pt needs extra time, needs to grab on to bedrails,  Transfers Overall transfer level: Needs assistance Equipment used: Rolling walker (2 wheeled)             General transfer comment: needs frequent cues to push up with hands, needs extra time     Balance Overall balance assessment: Needs assistance(needs close supervision, occasional min guard ) Sitting-balance support: Single extremity supported;Feet supported       Standing balance support: During functional activity(pt able to wash hands at sink)                               ADL either performed or assessed with clinical judgement   ADL Overall ADL's : Needs assistance/impaired Eating/Feeding: Set up   Grooming: Set up;Sitting    Upper Body Bathing: Set up;Sitting   Lower Body Bathing: Moderate assistance;Sit to/from stand;Cueing for safety;Cueing for sequencing   Upper Body Dressing : Set up;Sitting   Lower Body Dressing: Moderate assistance;Sit to/from stand;Cueing for safety;Cueing for sequencing   Toilet Transfer: Minimal assistance;RW;Comfort height toilet   Toileting- Clothing Manipulation and Hygiene: Minimal assistance;Sit to/from stand;Cueing for sequencing;Cueing for safety         General ADL Comments: Pt was I with ADL activity prior to DC.  Pts wife will  need pt to be mod I prior to return home     Vision Baseline Vision/History: Glaucoma Patient Visual Report: No change from baseline              Pertinent Vitals/Pain Pain Assessment: No/denies pain     Hand Dominance     Extremity/Trunk Assessment Upper Extremity Assessment Upper Extremity Assessment: Overall WFL for tasks assessed       Cervical / Trunk Assessment Cervical / Trunk Assessment: Kyphotic   Communication Communication Communication: No difficulties   Cognition Arousal/Alertness: Awake/alert Behavior During Therapy: WFL for tasks assessed/performed Overall Cognitive Status: Within Functional Limits for tasks assessed                                 General Comments: pt very pleasant and cooperative , wants  to be back to independence at home for ease for wife    General Comments               Home Living Family/patient expects to be discharged to:: Private residence Living Arrangements: Spouse/significant other;Non-relatives/Friends(wife uses a cane , ) Available Help at Discharge: Family;Available 24 hours/day(caregiver that lives close by Ronald Lindsey) ) Type of Home: House Home Access: Stairs to enter [employer] of Steps: 4 Entrance Stairs-Rails: Can reach both Home Layout: One level         Bathroom Toilet: Handicapped height     Home Equipment: Environmental consultant - 2  wheels;Cane - single point;Grab bars - toilet;Grab bars - tub/shower   Additional Comments: wife is not able to physically assist       Prior Functioning/Environment Level of Independence: Independent                 OT Problem List: Decreased strength;Decreased activity tolerance;Impaired balance (sitting and/or standing);Decreased safety awareness;Decreased knowledge of use of DME or AE      OT Treatment/Interventions: Self-care/ADL training;Patient/family education;DME and/or AE instruction    OT Goals(Current goals can be found in the care plan section) Acute Rehab OT Goals Patient Stated Goal: to get back to where he was prior to admission OT Goal Formulation: With patient Time For Goal Achievement: 09/22/17 ADL Goals Pt Will Perform Lower Body Bathing: with min assist;sit to/from stand Pt Will Perform Lower Body Dressing: with supervision Pt Will Transfer to Toilet: with supervision Pt Will Perform Toileting - Clothing Manipulation and hygiene: with supervision  OT Frequency: Min 2X/week   Barriers to D/C:               AM-PAC PT "6 Clicks" Daily Activity     Outcome Measure Help from another person eating meals?: None Help from another person taking care of personal grooming?: A Little Help from another person toileting, which includes using toliet, bedpan, or urinal?: A Little Help from another person bathing (including washing, rinsing, drying)?: A Lot Help from another person to put on and taking off regular upper body clothing?: A Little Help from another person to put on and taking off regular lower body clothing?: A Lot 6 Click Score: 17   End of Session Equipment Utilized During Treatment: Rolling walker Nurse Communication: Mobility status  Activity Tolerance: Patient tolerated treatment well Patient left: in chair;with call bell/phone within reach;with family/visitor present  OT Visit Diagnosis: Unsteadiness on feet (R26.81);Muscle weakness  (generalized) (M62.81)                Time: 1430-1455 OT Time Calculation (min): 25 min Charges:  OT General Charges $OT Visit: 1 Visit OT Evaluation $OT Eval Moderate Complexity: 1 Mod OT Treatments $Self Care/Home Management : 8-22 mins G-Codes:     Lise Auer, OT [phone]  Einar Crow D 09/22/2017, 4:22 PM

## 2017-09-22 NOTE — Care Management Important Message (Signed)
Important Message  Patient Details  Name: Ronald Lindsey MRN: [id number] Date of Birth: 1924/08/19   Medicare Important Message Given:  Yes    Caren MacadamFuller, Arun Herrod 09/22/2017, 12:06 PMImportant Message  Patient Details  Name: Ronald Lindsey MRN: [id number] Date of Birth: 1924/08/19   Medicare Important Message Given:  Yes    Caren MacadamFuller, Doreatha Offer 09/22/2017, 12:06 PM

## 2017-09-22 NOTE — Telephone Encounter (Signed)
-----   Message from Berton BonJanine Hammond, NP sent at 09/22/2017  3:48 PM EST ----- This pt has a TOC appt on 3/4 with Dawayne PatriciaLori G. He will need a TOC follow up. Probable discharge today to rehab.   Thanks,  Lizabeth LeydenNina Hammond, NP

## 2017-09-23 LAB — BASIC METABOLIC PANEL
Anion gap: 9 (ref 5–15)
BUN: 55 mg/dL — ABNORMAL HIGH (ref 6–20)
CALCIUM: 9 mg/dL (ref 8.9–10.3)
CHLORIDE: 105 mmol/L (ref 101–111)
CO2: 26 mmol/L (ref 22–32)
CREATININE: 2.23 mg/dL — AB (ref 0.61–1.24)
GFR calc non Af Amer: 24 mL/min — ABNORMAL LOW (ref >60)
GFR, EST AFRICAN AMERICAN: 28 mL/min — AB (ref >60)
Glucose, Bld: 125 mg/dL — ABNORMAL HIGH (ref 65–99)
Potassium: 4.8 mmol/L (ref 3.5–5.1)
SODIUM: 140 mmol/L (ref 135–145)

## 2017-09-23 LAB — GLUCOSE, CAPILLARY
Glucose-Capillary: 106 mg/dL — ABNORMAL HIGH (ref 65–99)
Glucose-Capillary: 176 mg/dL — ABNORMAL HIGH (ref 65–99)

## 2017-09-23 MED ORDER — INSULIN ASPART 100 UNIT/ML ~~LOC~~ SOLN: SUBCUTANEOUS | 0 refills | Status: DC

## 2017-09-23 MED ORDER — INSULIN ASPART 100 UNIT/ML ~~LOC~~ SOLN: 3.0000 [IU] | Freq: Three times a day (TID) | SUBCUTANEOUS | 0 refills | Status: DC

## 2017-09-23 MED ORDER — DOXYCYCLINE HYCLATE 100 MG PO TABS: 100.0000 mg | ORAL_TABLET | Freq: Two times a day (BID) | ORAL | 0 refills | Status: AC

## 2017-09-23 MED ORDER — ACETAMINOPHEN 325 MG PO TABS: 650.0000 mg | ORAL_TABLET | Freq: Four times a day (QID) | ORAL | 0 refills | Status: DC | PRN

## 2017-09-23 MED ORDER — TORSEMIDE 20 MG PO TABS: 60.0000 mg | ORAL_TABLET | Freq: Every day | ORAL | 0 refills | Status: DC

## 2017-09-23 MED ORDER — LANTUS SOLOSTAR 100 UNIT/ML ~~LOC~~ SOPN: 15.0000 [IU] | PEN_INJECTOR | Freq: Every day | SUBCUTANEOUS | 0 refills | Status: DC

## 2017-09-23 MED ORDER — TORSEMIDE 20 MG PO TABS: 60.0000 mg | ORAL_TABLET | Freq: Every day | ORAL | Status: DC

## 2017-09-23 MED ORDER — TRAZODONE HCL 50 MG PO TABS: 50.0000 mg | ORAL_TABLET | Freq: Every evening | ORAL | 0 refills | Status: DC | PRN

## 2017-09-23 MED ORDER — DOXYCYCLINE HYCLATE 100 MG PO TABS
100.0000 mg | ORAL_TABLET | Freq: Two times a day (BID) | ORAL | Status: DC
  Administered 2017-09-23: 100 mg via ORAL
  Filled 2017-09-23: qty 1

## 2017-09-23 MED ORDER — HYDROCERIN EX CREA: 1.0000 "application " | TOPICAL_CREAM | CUTANEOUS | 0 refills | Status: DC

## 2017-09-23 MED ORDER — LISINOPRIL 5 MG PO TABS: 5.0000 mg | ORAL_TABLET | Freq: Every day | ORAL | 0 refills | Status: DC

## 2017-09-23 NOTE — Progress Notes (Signed)
PROGRESS NOTE  Ronald Lindsey ZOX:[id number] DOB: Mar 05, 1925 DOA: 09/13/2017 PCP: Renford Dills, MD  HPI/Recap of past 24 hours:  1.4 liter Urine output  Documented last 24hrs  He is in the process of having his Unna boots changed, bilateral lower extremity edema is much improved.  Right leg blistering now popped up, with some dry blood, mild erythema No short of breath at rest,  no cough, no chest pain, no fever bradycardia is improving, now 40-50's at night     Assessment/Plan: Principal Problem:   Acute diastolic CHF (congestive heart failure), NYHA class 3 (HCC) Active Problems:   HTN (hypertension)   GERD (gastroesophageal reflux disease)   Renal failure   DM (diabetes mellitus) (HCC)   CHF (congestive heart failure) (HCC)   Mobitz (type) I (Wenckebach's) atrioventricular block   Acute on chronic distolic CHF (congestive heart failure), NYHA class II (HCC) - Patient had a elevated BNP of 518 in the ED, Chest x-ray reported pulmonary vascular congestion, -on Lasix 40 mg IV BID, ( he is on lasix 80mg  bid at home), diuretic changed to demadex 60mg  bid since 2/24 per cardiology recommendation -Bilateral lower extremity edema has much improved, remain c/o DOE -Creatinine slightly trending up , cardiology to manage diuresis, will follow cardiology recommendation. Patient request to follow up with Dr Katrinka Blazing after discharge from the hospital.  Bradycardia/Mobitz 1 - coreg discontinued -Follow cardiology and EP recommendation  Bilateral lower extremity edema (see pic below) -Venous doppler negative for dvt -Wound care consulted, Unna boots bilaterally recommended - per wound care "Drainage (amount, consistency, odor) As described above Periwound: edematous with erythema in LLE and dry, flaking skin on RLE.  Macular ash on posterior RLE covering a 12cm x 14cm area.  Patient denies itching or discomfort in this area. Dressing procedure/placement/frequency: We will dress wound  with a topical antimicrobial (silver hydrofiber) and compress.  Initially I preferred a dry boot, but after consideration of urgency, agree with applying a zinc-past boot and changing twice weekly.  Patent should follow in the outpatient [facility] post discharge until healed. Expeditious healing is the priority due to patient needing to have a pacemaker implanted. Orders provided for nursing team for wound care. Additionally, I have included moisturizing with Eucerin cream prior to Unna's boot application as they tend to dry tissues, pressure redistribution heel boots for use in bed and a pressure redistribution chair cushion for OOB use to prevent pressure injury."  -mild erythema around LLE blister, will start doxycycline for 5 days. Need to follow up with  wound care  Clinic   CKD III, cr stable at baseline - will need to differentiate between over diuresis versus cardiorenal syndrome if creatinine go up   Insulin dependent DM (diabetes mellitus) (HCC) - had  hypoglycemic episodes initially in the hospital  -on lantus 35units bid at home, currently on Lantus 15 units SQ qd . Continue on SSI, a.m.  - Diabetic diet.  - a1c  6.3  Plan to discharge on decreased dose insulin.  HTN (hypertension) -BP stable diuretics and  lisinopril (reduced from 10 to 5mg  to give room for diuresis) -avoid  B blocker due to bradycardia     GERD (gastroesophageal reflux disease) - stable currently    Body mass index is 34.99 kg/m.  Code Status: full  Family Communication: patient   Disposition Plan: to SNF, need cardiology clearance   Consultants:  Cardiology  EP  Wound  Care  PT/OT  Procedures:  none  Antibiotics:  Oral doxycycline from 2/26   Objective: BP (!) 136/47 (BP Location: Left Arm)   Pulse 99   Temp 97.7 F (36.5 C) (Oral)   Resp 20   Ht 5' 9.5" (1.765 m)   Wt 109 kg (240 lb 6.4 oz)   SpO2 96%   BMI 34.99 kg/m   Intake/Output Summary (Last 24 hours) at 09/23/2017  1025 Last data filed at 09/23/2017 0900 Gross per 24 hour  Intake 440 ml  Output 950 ml  Net -510 ml   Filed Weights   09/21/17 0356 09/22/17 0343 09/23/17 0357  Weight: 111.3 kg (245 lb 6.4 oz) 109.2 kg (240 lb 12.8 oz) 109 kg (240 lb 6.4 oz)    Exam: Patient is examined daily including today on 09/23/2017, exams remain the same as of yesterday except that has changed    General:  NAD, pleasant, left eye legally blind  Cardiovascular: RRR  Respiratory: CTABL  Abdomen: Soft/ND/NT, positive BS  Musculoskeletal: bilateral lower extremity pitting Edema has much improved, LLE open blister with dry blood, mild erythema, small RLE blister, no drainage, no erythema,    Left leg on 2/26     Right leg on 2/26      pic obtained on 2/26    Neuro: alert, oriented   Data Reviewed: Basic Metabolic Panel: Recent Labs  Lab 09/19/17 0451 09/20/17 0530 09/21/17 0528 09/22/17 0522 09/23/17 0519  NA 139 141 141 141 140  K 4.4 4.3 5.2* 4.8 4.8  CL 108 107 105 107 105  CO2 24 23 26 25 26   GLUCOSE 127* 147* 118* 119* 125*  BUN 62* 59* 53* 57* 55*  CREATININE 2.14* 1.95* 2.01* 2.06* 2.23*  CALCIUM 8.5* 8.6* 9.0 9.0 9.0  MG 2.4  --   --   --   --    Liver Function Tests: Recent Labs  Lab 09/22/17 0522  AST 38  ALT 28  ALKPHOS 207*  BILITOT 0.9  PROT 6.4*  ALBUMIN 2.8*   No results for input(s): LIPASE, AMYLASE in the last 168 hours. No results for input(s): AMMONIA in the last 168 hours. CBC: Recent Labs  Lab 09/19/17 0451 09/22/17 0522  WBC 5.5 5.5  NEUTROABS 2.5  --   HGB 12.1* 13.1  HCT 36.9* 40.3  MCV 104.8* 105.8*  PLT 163 155   Cardiac Enzymes:   No results for input(s): CKTOTAL, CKMB, CKMBINDEX, TROPONINI in the last 168 hours. BNP (last 3 results) Recent Labs    09/13/17 1622 09/22/17 0522  BNP 518.8* 488.5*    ProBNP (last 3 results) No results for input(s): PROBNP in the last 8760 hours.  CBG: Recent Labs  Lab 09/22/17 0733  09/22/17 1156 09/22/17 1650 09/22/17 2236 09/23/17 0759  GLUCAP 83 170* 148* 108* 106*    No results found for this or any previous visit (from the past 240 hour(s)).   Studies: No results found.  Scheduled Meds: . aspirin EC  81 mg Oral Daily  . heparin  5,000 Units Subcutaneous Q8H  . hydrocerin   Topical Once per day on Tue Fri  . insulin aspart  0-9 Units Subcutaneous TID WC  . insulin aspart  3 Units Subcutaneous TID WC  . insulin glargine  15 Units Subcutaneous QHS  . lisinopril  5 mg Oral Daily  . multivitamin  1 tablet Oral BID  . sodium chloride flush  3 mL Intravenous Q12H  . torsemide  60 mg Oral BID    Continuous Infusions: . sodium  chloride       Time spent: , case discussed with cardiology over the phone I have personally reviewed and interpreted on  09/23/2017 daily labs, tele strips, imagings as discussed above under date review session and assessment and plans.  I reviewed all nursing notes, pharmacy notes, consultant notes,  vitals, pertinent old records  I have discussed plan of care as described above with RN , patient   on 09/23/2017   Albertine Grates MD, PhD  [facility] Pager [phone]. If 7PM-7AM, please contact night-coverage at [email or web address], password [email or web address] 09/23/2017, 10:25 AM  LOS: 10 days

## 2017-09-23 NOTE — Care Management Note (Signed)
Case Management Note  Patient Details  Name: Ronald Lindsey MRN: [id number] Date of Birth: Dec 08, 1924  Subjective/Objective: CSW following for d/c SNF if cardio clears.                   Action/Plan:dc SNF   Expected Discharge Date:  09/23/17               Expected Discharge Plan:  Skilled Nursing Facility  In-House Referral:  Clinical Social Work  Discharge planning Services  CM Consult  Post Acute Care Choice:    Choice offered to:     DME Arranged:    DME Agency:     HH Arranged:    HH Agency:     Status of Service:  Completed, signed off  If discussed at [employer] Meetings, dates discussed:    Additional Comments:  Ronald Lindsey, Ronald Deason, RN 09/23/2017, 10:31 AM

## 2017-09-23 NOTE — Progress Notes (Signed)
Nutrition Brief Note  Patient identified for low Braden.   Wt Readings from Last 15 Encounters:  09/23/17 240 lb 6.4 oz (109 kg)  05/08/15 242 lb (109.8 kg)  11/06/11 256 lb (116.1 kg)    Body mass index is 34.99 kg/m. Patient meets criteria for obesity based on current BMI. Bilateral leg wounds documented in flow sheet. Pt admitted with acute on CHF with associated BLE edema, CKD 3, DM, HTN, GERD. Discharge order in place with plan for [facility] for rehab.  Current diet order is Heart Healthy/Carb Modified, patient is consuming approximately 75-100% of meals at this time. Medications reviewed; sliding scale Novolog, 3 units Novolog TID, 15 units Lantus/day, 1 tablet Prosight BID. Labs reviewed; BUN: 565 mg/dL, creatinine: 1.192.23 mg/dL, GFR: 24 mL/min.    No nutrition interventions warranted at this time. If nutrition issues arise, please consult RD.      Trenton GammonJessica Zackary Mckeone, MS, RD, LDN, [facility] Inpatient Clinical Dietitian Pager # [phone] After hours/weekend pager # [phone]

## 2017-09-23 NOTE — Telephone Encounter (Signed)
Patient [facility] on [facility]/17 to SNF.  Patient's wife (DPR on file) understands to follow up with provider Norma FredricksonLori Gerhardt, NP on 09/29/17 at 10:00 AM at [address] [place], [place] [id number].  Patient's wife understands discharge instructions? Yes  Patient's wife understands medications and regiment? Yes  Patient's wife understands to bring all medications to this visit? Yes  Patient's wife states that the patient is at rehab and they have told her that they have all of the patient's medicines and that they will be bringing the patient to the appointment and the wife will meet them there.

## 2017-09-23 NOTE — Progress Notes (Signed)
[facility] [place].  Awaiting PTAR.

## 2017-09-23 NOTE — Progress Notes (Addendum)
Progress Note  Patient Name: Ronald Lindsey Date of Encounter: 09/23/2017  Primary Cardiologist: Charlton HawsPeter Nishan, MD   Subjective   Pt denies dyspnea. Has only been up the room and tolerating that OK. Feels much better than on admission, about the same as yesterday.   Inpatient Medications    Scheduled Meds: . aspirin EC  81 mg Oral Daily  . heparin  5,000 Units Subcutaneous Q8H  . hydrocerin   Topical Once per day on Tue Fri  . insulin aspart  0-9 Units Subcutaneous TID WC  . insulin aspart  3 Units Subcutaneous TID WC  . insulin glargine  15 Units Subcutaneous QHS  . lisinopril  5 mg Oral Daily  . multivitamin  1 tablet Oral BID  . sodium chloride flush  3 mL Intravenous Q12H  . torsemide  60 mg Oral BID   Continuous Infusions: . sodium chloride     PRN Meds: sodium chloride, acetaminophen, hydrALAZINE, ondansetron (ZOFRAN) IV, sodium chloride flush, traZODone   Vital Signs    Vitals:   09/22/17 1328 09/22/17 2233 09/23/17 0357 09/23/17 0428  BP: (!) 145/46 (!) 135/46  (!) 136/47  Pulse: (!) 107 (!) 47  99  Resp: 20 20  20   Temp: 98.7 F (37.1 C) 98 F (36.7 C)  97.7 F (36.5 C)  TempSrc: Oral Oral  Oral  SpO2: 97% 98%  96%  Weight:   240 lb 6.4 oz (109 kg)   Height:        Intake/Output Summary (Last 24 hours) at 09/23/2017 1053 Last data filed at 09/23/2017 0900 Gross per 24 hour  Intake 440 ml  Output 950 ml  Net -510 ml   Filed Weights   09/21/17 0356 09/22/17 0343 09/23/17 0357  Weight: 245 lb 6.4 oz (111.3 kg) 240 lb 12.8 oz (109.2 kg) 240 lb 6.4 oz (109 kg)    Telemetry    Wenckebach rates in the 30's overnight, 40's-50's during day - Personally Reviewed  ECG    No new tracings.  - Personally Reviewed  Physical Exam   GEN: Elderly male, No acute distress.   Neck: No JVD Cardiac: slow irregular rhythm, no murmurs, rubs, or gallops.  Respiratory: Clear to auscultation bilaterally. GI: Soft, nontender, non-distended  MS: Trace-1+  pedal/ankle edema; Pink foam dressings in place to lower legs Neuro:  Nonfocal  Psych: Normal affect   Labs    Chemistry Recent Labs  Lab 09/21/17 0528 09/22/17 0522 09/23/17 0519  NA 141 141 140  K 5.2* 4.8 4.8  CL 105 107 105  CO2 26 25 26   GLUCOSE 118* 119* 125*  BUN 53* 57* 55*  CREATININE 2.01* 2.06* 2.23*  CALCIUM 9.0 9.0 9.0  PROT  --  6.4*  --   ALBUMIN  --  2.8*  --   AST  --  38  --   ALT  --  28  --   ALKPHOS  --  207*  --   BILITOT  --  0.9  --   GFRNONAA 28* 27* 24*  GFRAA 32* 31* 28*  ANIONGAP 10 9 9      Hematology Recent Labs  Lab 09/19/17 0451 09/22/17 0522  WBC 5.5 5.5  RBC 3.52* 3.81*  HGB 12.1* 13.1  HCT 36.9* 40.3  MCV 104.8* 105.8*  MCH 34.4* 34.4*  MCHC 32.8 32.5  RDW 15.3 15.3  PLT 163 155    Cardiac EnzymesNo results for input(s): TROPONINI in the last 168 hours. No results  for input(s): TROPIPOC in the last 168 hours.   BNP Recent Labs  Lab 09/22/17 0522  BNP 488.5*     DDimer No results for input(s): DDIMER in the last 168 hours.   Radiology    No results found.  Cardiac Studies   Lower extremity venous dopplers 09/18/17 Right: There is no evidence of deep vein thrombosis in the lower extremity. No cystic structure found in the popliteal fossa. Left: There is no evidence of deep vein thrombosis in the lower extremity. No cystic structure found in the popliteal fossa.  Echocardiogram 09/14/17 Study Conclusions  - Left ventricle: The cavity size was normal. Wall thickness was increased in a pattern of mild LVH. Systolic function was vigorous. The estimated ejection fraction was in the range of 65% to 70%. - Mitral valve: There was mild regurgitation.   Patient Profile     84 y.o. male with a history of hypertension, type 2 diabetes mellitus, macular degeneration, presenting with bilateral leg edema and ulcerations, also bradycardia with Wenckebach block and 2:1 block.  Assessment & Plan    Acute diastolic  heart failure -Has been diuresed with lasix 40 mg IV BID. Switched to demadex 60 mg BID 2/24 as his home lasix 80 mg am and 40 mg pm was not doing an adequate job of fluid removal. -1.4L UOP since yesterday. Net negative 4L fluid balance since admission. (UOP collection not likely complete) Wt is down 18 pounds from max 258>>240 lbs. Stable since yesterday.  -Breathing is much better than on admission, but still has mild DOE with walking to the bathroom.  -LE edema is significantly improved.  -Will need daily wts at discharge to monitor volume status and help adjust diuretic regimen.  -Will arrange early [facility] office follow up with labs. Arranged for cardiology f/u for CHF with Norma Fredrickson on 3/4 and follow up for EP on 3/8.  -Pt planned for rehab stay to help improve functional status before returning home. Pt has a bed at [facility] -Dr. Roda Shutters would like input on discharge diuretic regimen.   Discussed with Dr. Katrinka Blazing. Will decrease torsemide to 60 mg daily and will need daily wt under same circumstances. Notify our office for increase of 3 pounds in 1 day or 5 pounds in a week.    Lower extremity edema with ulcerations -Wound care team following. Legs wrapped and therapeutic diet.   Mobitz 1 AVB -HR 40's-50's and down to 30's at night.  Wenckebach. Likely contributing to his weakness, dyspnea and fatigue.  -Evaluated by Dr. Elberta Fortis for consideration of PPM. Not optimal time to place PPM due to leg wounds. Pt has EP outpatient follow up arranged.  -Avoiding AV nodal blocking agents.  CKD stage 3 -SCr bumped up 2.06>2.23 with torsemide. Will need BMet at follow up. Will need BMet at follow up to assess renal function and K+.  Hypertension  -Lisinopril reduced to allow for diuresis. -BP elevated yesterday, improved this am.   DM -On insulin. A1c 6.3. Management per IM  For questions or updates, please contact [facility] Please consult [email or web address] for contact info under  Cardiology/STEMI.      Signed, Berton Bon, NP  09/23/2017, 10:53 AM

## 2017-09-23 NOTE — Discharge Summary (Signed)
Discharge Summary  Ronald Lindsey:[id number] DOB: 27-Nov-1924  PCP: Renford Dills, MD  Admit date: 09/13/2017 Discharge date: 09/23/2017  Time spent: >66mins, more than 50% time spent on coordination of care.  Recommendations for Outpatient Follow-up:  1. F/u with SNF MD  for hospital discharge follow up, repeat cbc/bmp at follow up 2. F/u with cardiology on 3/4 3. F/u with EP on 3/8 4. Refer to wound care clinic, continue unna boots, to be changed twice a week 5. Heart failure precaution, daily weight, fluids restriction.   Discharge Diagnoses:  Active Hospital Problems   Diagnosis Date Noted  . Acute diastolic CHF (congestive heart failure), NYHA class 3 (HCC) 09/13/2017  . Mobitz (type) I (Wenckebach's) atrioventricular block 09/16/2017  . CHF (congestive heart failure) (HCC) 09/13/2017  . DM (diabetes mellitus) (HCC) 11/06/2011  . Renal failure 11/06/2011  . GERD (gastroesophageal reflux disease)   . HTN (hypertension)     Resolved Hospital Problems  No resolved problems to display.    Discharge Condition: stable  Diet recommendation: heart healthy/carb modified  Filed Weights   09/21/17 0356 09/22/17 0343 09/23/17 0357  Weight: 111.3 kg (245 lb 6.4 oz) 109.2 kg (240 lb 12.8 oz) 109 kg (240 lb 6.4 oz)    History of present illness:  PCP: Renford Dills, MD   Outpatient Specialists: None  Patient coming from: Home  Chief Complaint: Shortness of breath and leg swelling  HPI: Ronald Lindsey is a 84 y.o. male with medical history significant of diabetes with diabetic retinopathy and hypertension who presents to the ER with shortness of breath. Patient started having this exertional dyspnea over the last 1 week. There is associated declining functional capacity unable to walk or do what he normally does. Also had PND and orthopnea. Patient last echocardiogram was in 2016. He has history of diabetes mainly with hypertension and GERD but no previous cardiac  diagnosis. Patient was also noted today to have new onset atrial fibrillation. Rate is however control. Cardiology has been consulted in the ER and will see patient in the hospital.  ED Course: Patient given IV Lasix with chest x-ray ordered and cardiology consulted. EKG ordered showing atrial fibrillation. BUN creatinine is elevated with carinal 1.83     Hospital Course:  Principal Problem:   Acute diastolic CHF (congestive heart failure), NYHA class 3 (HCC) Active Problems:   HTN (hypertension)   GERD (gastroesophageal reflux disease)   Renal failure   DM (diabetes mellitus) (HCC)   CHF (congestive heart failure) (HCC)   Mobitz (type) I (Wenckebach's) atrioventricular block  Acute on chronic distolic CHF (congestive heart failure), NYHA class II (HCC) - Patient had a elevated BNP of 518 in the ED, Chest x-ray reportedpulmonary vascular congestion, -on Lasix 40 mg IV BID, ( he is on lasix 80mg  bid at home), diuretic changed to demadex 60mg  bid since 2/24 per cardiology recommendation -Bilateral lower extremity edema has much improved, remain c/o DOE -Creatinine slightly trending up , cardiology recommend to discharge on demadex 60mg  daily. Patient is to follow up with cardiology on 3/4.    Bradycardia/Mobitz 1 -coreg discontinued -EP consulted, patient to follow up with EP on 3/8.  Bilateral lower extremity edema (see pic below) -Venous doppler negative for dvt -Wound care consulted, Unna boots bilaterally recommended - per wound care "Drainage (amount, consistency, odor)As described above Periwound:edematous with erythema in LLE and dry, flaking skin on RLE. Macular ash on posterior RLE covering a 12cm x 14cm area. Patient denies itching or  discomfort in this area. Dressing procedure/placement/frequency:We will dress wound with a topical antimicrobial (silver hydrofiber) and compress. Initially I preferred a dry boot, but after consideration of urgency, agree with  applying a zinc-past boot and changing twice weekly. [facility]. Expeditious healing is the priority due to patient needing to have a pacemaker implanted. Orders provided for nursing team for wound care. Additionally, I have included moisturizing with Eucerin cream prior to Unna's boot application as they tend to dry tissues, pressure redistribution heel boots for use in bed and a pressure redistribution chair cushion for OOB use to prevent pressure injury."  -mild erythema around LLE blister, will start doxycycline for 5 days. -Need to follow up with  wound care  Clinic   CKD III, cr stable at baseline - will need to differentiate between over diuresis versus cardiorenal syndrome if creatinine go up -need to repeat bmp at hospital discharge follow up.  Insulin dependent DM (diabetes mellitus) (HCC) -had  hypoglycemic episodes initially in the hospital  -on lantus 35units bid at home, currently on Lantus 15 units SQ qHS . Continueon SSI,  -Diabetic diet.  - a1c 6.3 -discharge on decreased dose insulin  Lantus 15 units SQ qHS, novolog meal coverage 3untis tid, and ssi.  HTN (hypertension) -BP stable diuretics and lisinopril (reduced from 10 to 5mg  to give room for diuresis) -avoid  B blocker due to bradycardia   GERD (gastroesophageal reflux disease) - stable currently  Body mass index is 34.99 kg/m.  Code Status: full  Family Communication: patient   Disposition Plan: to SNF with cardiology clearance   Consultants:  Cardiology  EP  Wound  Care  PT/OT  Procedures:  none  Antibiotics:  Oral doxycycline from 2/26   Discharge Exam: BP (!) 136/47 (BP Location: Left Arm)   Pulse 99   Temp 97.7 F (36.5 C) (Oral)   Resp 20   Ht 5' 9.5" (1.765 m)   Wt 109 kg (240 lb 6.4 oz)   SpO2 96%   BMI 34.99 kg/m     General:  NAD, pleasant, left eye legally blind  Cardiovascular:  RRR  Respiratory: CTABL  Abdomen: Soft/ND/NT, positive BS  Musculoskeletal: bilateral lower extremity pitting Edema has much improved, LLE open blister with dry blood, mild erythema, small RLE blister, no drainage    pic taken on 2/26, left leg    pic taken on 2/26, right leg    pic taken on 2/26 Discharge Instructions You were cared for by a hospitalist during your hospital stay. If you have any questions about your discharge medications or the care you received while you were in the hospital after you are discharged, you can call the unit and asked to speak with the hospitalist on call if the hospitalist that took care of you is not available. Once you are discharged, your primary care physician will handle any further medical issues. Please note that NO REFILLS for any discharge medications will be authorized once you are discharged, as it is imperative that you return to your primary care physician (or establish a relationship with a primary care physician if you do not have one) for your aftercare needs so that they can reassess your need for medications and monitor your lab values.  Discharge Instructions    Diet - low sodium heart healthy   Complete by:  As directed    Carb modified. 1.5 liter fluids restriction.   Discharge instructions  Complete by:  As directed    Daily weight, please call cardiology if weight go up 3lb in three days.   Increase activity slowly   Complete by:  As directed      Allergies as of 09/23/2017   No Known Allergies     Medication List    STOP taking these medications   furosemide 40 MG tablet Commonly known as:  LASIX     TAKE these medications   acetaminophen 325 MG tablet Commonly known as:  TYLENOL Take 2 tablets (650 mg total) by mouth every 6 (six) hours as needed for mild pain or headache.   aspirin EC 81 MG tablet Take 81 mg by mouth daily.   doxycycline 100 MG tablet Commonly known as:  VIBRA-TABS Take 1 tablet (100 mg  total) by mouth every 12 (twelve) hours for 5 days.   hydrocerin Crea Apply 1 application topically 2 (two) times a week.   insulin aspart 100 UNIT/ML injection Commonly known as:  novoLOG Inject 3 Units into the skin 3 (three) times daily with meals.   insulin aspart 100 UNIT/ML injection Commonly known as:  novoLOG Insulin sliding scale: Blood sugar  120-150   3units                       151-200   4units                       201-250   7units                       251- 300  11units                       301-350   15uints                       351-400   20units                       >400         call MD immediately   LANTUS SOLOSTAR 100 UNIT/ML Solostar Pen Generic drug:  Insulin Glargine Inject 15 Units into the skin daily at 10 pm. What changed:    how much to take  when to take this   lisinopril 5 MG tablet Commonly known as:  PRINIVIL,ZESTRIL Take 1 tablet (5 mg total) by mouth daily. Start taking on:  09/24/2017 What changed:    medication strength  how much to take   PRESERVISION AREDS 2 PO Take 1 tablet by mouth 2 (two) times daily.   torsemide 20 MG tablet Commonly known as:  DEMADEX Take 3 tablets (60 mg total) by mouth daily. Start taking on:  09/24/2017   traZODone 50 MG tablet Commonly known as:  DESYREL Take 1 tablet (50 mg total) by mouth at bedtime as needed for sleep.      No Known Allergies Follow-up Information    Regan Lemming, MD Follow up on 10/03/2017.   Specialty:  Cardiology Why:  12:15PM Contact information: [address] [place] [address] [phone]        Rosalio Macadamia, NP Follow up.   Specialties:  Nurse Practitioner, Interventional Cardiology, Cardiology, Radiology Why:  On March 4th at 10:00 with Dr. Fabio Bering nurse practitioner for cardiology hospital follow up.  Contact information: [address]  [place] [address] [phone]        Renford Dills, MD Follow up in 2  week(s).   Specialty:  Internal Medicine Contact information: [address] [place] [address] [phone]        Wendall Stade, MD Follow up.   Specialty:  Cardiology Contact information: [address] [place] [address] [phone]        wound care clinic referrral Follow up.            The results of significant diagnostics from this hospitalization (including imaging, microbiology, ancillary and laboratory) are listed below for reference.    Significant Diagnostic Studies: Dg Chest 2 View  Result Date: 09/13/2017 CLINICAL DATA:  Shortness of breath. EXAM: CHEST  2 VIEW COMPARISON:  Chest x-ray dated March 18, 2017. FINDINGS: Mild cardiomegaly and central vascular congestion. Atherosclerotic calcification of the aortic arch. Low lung volumes. Small bilateral pleural effusions with bibasilar atelectasis. No pneumothorax. No acute osseous abnormality. IMPRESSION: Pulmonary vascular congestion and small bilateral pleural effusions. Electronically Signed   By: Obie Dredge M.D.   On: 09/13/2017 15:39    Microbiology: No results found for this or any previous visit (from the past 240 hour(s)).   Labs: Basic Metabolic Panel: Recent Labs  Lab 09/19/17 0451 09/20/17 0530 09/21/17 0528 09/22/17 0522 09/23/17 0519  NA 139 141 141 141 140  K 4.4 4.3 5.2* 4.8 4.8  CL 108 107 105 107 105  CO2 24 23 26 25 26   GLUCOSE 127* 147* 118* 119* 125*  BUN 62* 59* 53* 57* 55*  CREATININE 2.14* 1.95* 2.01* 2.06* 2.23*  CALCIUM 8.5* 8.6* 9.0 9.0 9.0  MG 2.4  --   --   --   --    Liver Function Tests: Recent Labs  Lab 09/22/17 0522  AST 38  ALT 28  ALKPHOS 207*  BILITOT 0.9  PROT 6.4*  ALBUMIN 2.8*   No results for input(s): LIPASE, AMYLASE in the last 168 hours. No results for input(s): AMMONIA in the last 168 hours. CBC: Recent Labs  Lab 09/19/17 0451 09/22/17 0522  WBC 5.5 5.5  NEUTROABS 2.5  --   HGB 12.1* 13.1    HCT 36.9* 40.3  MCV 104.8* 105.8*  PLT 163 155   Cardiac Enzymes: No results for input(s): CKTOTAL, CKMB, CKMBINDEX, TROPONINI in the last 168 hours. BNP: BNP (last 3 results) Recent Labs    09/13/17 1622 09/22/17 0522  BNP 518.8* 488.5*    ProBNP (last 3 results) No results for input(s): PROBNP in the last 8760 hours.  CBG: Recent Labs  Lab 09/22/17 1156 09/22/17 1650 09/22/17 2236 09/23/17 0759 09/23/17 1125  GLUCAP 170* 148* 108* 106* 176*       Signed:  Albertine Grates MD, PhD  [facility] 09/23/2017, 12:10 PM

## 2017-09-23 NOTE — Clinical Social Work Placement (Signed)
Patient received and accepted bed offer at [facility]. Facility aware of patient's discharge and confirmed patient's bed offer. PT[facility] contacted, patient's family notified. Patient's RN can call report to [phone], packet complete. CSW signing off, no other needs identified at this time.  CLINICAL SOCIAL WORK PLACEMENT  NOTE  Date:  09/23/2017  Patient Details  Name: Ronald Lindsey MRN: [id number] Date of Birth: 1925/01/23  Clinical Social Work is seeking post-discharge placement for this patient at the Skilled  Nursing Facility level of care (*CSW will initial, date and re-position this form in  chart as items are completed):  Yes   Patient/family provided with [facility] Clinical Social Work Department's list of facilities offering this level of care within the geographic area requested by the patient (or if unable, by the patient's family).  Yes   Patient/family informed of their freedom to choose among providers that offer the needed level of care, that participate in Medicare, Medicaid or managed care program needed by the patient, have an available bed and are willing to accept the patient.  Yes   Patient/family informed of [facility]'s ownership interest in [facility] and [facility], as well as of the fact that they are under no obligation to receive care at these facilities.  PASRR submitted to EDS on 09/22/17     PASRR number received on 09/22/17     Existing PASRR number confirmed on       FL2 transmitted to all facilities in geographic area requested by pt/family on 09/22/17     FL2 transmitted to all facilities within larger geographic area on       Patient informed that his/her managed care company has contracts with or will negotiate with certain facilities, including the following:        Yes   Patient/family informed of bed offers received.  Patient chooses bed at [facility]     Physician recommends and patient chooses bed at        Patient to be transferred to [facility] on 09/23/17.  Patient to be transferred to facility by [facility]     Patient family notified on 09/23/17 of transfer.  Name of family member notified:  Ronald Lindsey     PHYSICIAN       Additional Comment:    _______________________________________________ Antionette PolesKimberly L Kaycie Pegues, LCSW 09/23/2017, 1:18 PM

## 2017-09-29 ENCOUNTER — Encounter: Payer: Self-pay | Admitting: Nurse Practitioner

## 2017-09-29 ENCOUNTER — Ambulatory Visit: Payer: Medicare Other | Admitting: Nurse Practitioner

## 2017-09-29 VITALS — BP 150/50 | HR 46 | Ht 70.5 in | Wt 241.0 lb

## 2017-09-29 DIAGNOSIS — R0602 Shortness of breath: Secondary | ICD-10-CM

## 2017-09-29 DIAGNOSIS — I441 Atrioventricular block, second degree: Secondary | ICD-10-CM

## 2017-09-29 DIAGNOSIS — I5031 Acute diastolic (congestive) heart failure: Secondary | ICD-10-CM

## 2017-09-29 LAB — CBC
Hematocrit: 41.6 % (ref 37.5–51.0)
Hemoglobin: 13.9 g/dL (ref 13.0–17.7)
MCH: 34.5 pg — ABNORMAL HIGH (ref 26.6–33.0)
MCHC: 33.4 g/dL (ref 31.5–35.7)
MCV: 103 fL — ABNORMAL HIGH (ref 79–97)
Platelets: 161 10*3/uL (ref 150–379)
RBC: 4.03 x10E6/uL — ABNORMAL LOW (ref 4.14–5.80)
RDW: 14.2 % (ref 12.3–15.4)
WBC: 7.2 10*3/uL (ref 3.4–10.8)

## 2017-09-29 LAB — BASIC METABOLIC PANEL
BUN/Creatinine Ratio: 29 — ABNORMAL HIGH (ref 10–24)
BUN: 74 mg/dL — ABNORMAL HIGH (ref 10–36)
CO2: 21 mmol/L (ref 20–29)
Calcium: 8.8 mg/dL (ref 8.6–10.2)
Chloride: 103 mmol/L (ref 96–106)
Creatinine, Ser: 2.51 mg/dL — ABNORMAL HIGH (ref 0.76–1.27)
GFR calc Af Amer: 25 mL/min/{1.73_m2} — ABNORMAL LOW (ref >59)
GFR calc non Af Amer: 22 mL/min/{1.73_m2} — ABNORMAL LOW (ref >59)
Glucose: 167 mg/dL — ABNORMAL HIGH (ref 65–99)
Potassium: 5 mmol/L (ref 3.5–5.2)
Sodium: 140 mmol/L (ref 134–144)

## 2017-09-29 LAB — PRO B NATRIURETIC PEPTIDE: NT-Pro BNP: 2151 pg/mL — ABNORMAL HIGH (ref 0–486)

## 2017-09-29 NOTE — Progress Notes (Signed)
CARDIOLOGY OFFICE NOTE  Date:  09/29/2017    [name] Date of Birth: 12/11/1924 Medical Record #[id number]  PCP:  Renford Dills, MD  Cardiologist:  [facility]  Chief Complaint  Patient presents with  . Irregular Heart Beat    TOC visit - seen for Dr. Elberta Fortis    History of Present Illness: Ronald Lindsey is a 84 y.o. male who presents today for a post hospital/TOC visit. Seen for Dr. Elberta Fortis & Eden Emms.   He has a history of hypertension, type 2 diabetes mellitus, & macular degeneration. No prior cardiac history noted.   Admitted earlier this month with bilateral leg edema and ulcerations, also bradycardia with Wenckebach block and 2:1 block. Concern noted for possible atrial fib though noted to be Mobitz I - also with noctural bradycardia. Had received a total of 3 doses of Coreg during this admission - this was stopped. Was seen by EP - PPM felt to be indicated but held off due to the leg ulcers/concern for infection. Referred to the wound clinic - placed in unna boots. Treated for diastolic HF as well.  No DVT per doppler study.   Comes in today. Here with his wife and the caregiver - Ronald Lindsey. He says he is doing good and has no real complaints. Weight is 235 at home. He was discharged from [facility] last week. Trying to do better with salt restriction - they like to eat out. His legs are still wrapped. Apparently the staff at [facility] said the ulcers were healing. Home health is to come out - but they have not called yet. No chest pain. Breathing is "ok". Not dizzy or lightheaded. No passing out.   Past Medical History:  Diagnosis Date  . Acute diastolic CHF (congestive heart failure), NYHA class 3 (HCC) 09/13/2017  . Back pain   . Cellulitis of right leg 11/06/2011  . CHF (congestive heart failure) (HCC) 09/13/2017  . Diabetes mellitus   . Diabetic neuropathy (HCC)   . Diabetic retinopathy   . DM (diabetes mellitus) (HCC) 11/06/2011  . Gastroenteritis 11/06/2011  . GERD  (gastroesophageal reflux disease)   . HTN (hypertension)   . Macular degeneration   . Mobitz (type) I (Wenckebach's) atrioventricular block 09/16/2017  . Pleural effusion 05/08/2015  . Previous back surgery 11/06/2011  . Renal failure 11/06/2011  . S/P appendectomy 11/06/2011    Past Surgical History:  Procedure Laterality Date  . BRAIN SURGERY    . CATARACT EXTRACTION     bilateral eyes  . COLONOSCOPY  11/08/2011   Procedure: COLONOSCOPY;  Surgeon: Graylin Shiver, MD;  Location: [facility] ENDOSCOPY;  Service: Endoscopy;  Laterality: N/A;  with decompression  . IMPLANTABLE CONTACT LENS IMPLANTATION     RT eye successful, LT eye is difficult to see - pt states     Medications: Current Meds  Medication Sig  . acetaminophen (TYLENOL) 325 MG tablet Take 2 tablets (650 mg total) by mouth every 6 (six) hours as needed for mild pain or headache.  Marland Kitchen aspirin EC 81 MG tablet Take 81 mg by mouth daily.  . hydrocerin (EUCERIN) CREA Apply 1 application topically 2 (two) times a week.  . insulin aspart (NOVOLOG) 100 UNIT/ML injection Insulin sliding scale: Blood sugar  120-150   3units                       151-200   4units  201-250   7units                       251- 300  11units                       301-350   15uints                       351-400   20units                       >400         call MD immediately  . LANTUS SOLOSTAR 100 UNIT/ML Solostar Pen Inject 15 Units into the skin daily at 10 pm.  . lisinopril (PRINIVIL,ZESTRIL) 5 MG tablet Take 1 tablet (5 mg total) by mouth daily.  . Multiple Vitamins-Minerals (PRESERVISION AREDS 2 PO) Take 1 tablet by mouth 2 (two) times daily.  Marland Kitchen. torsemide (DEMADEX) 20 MG tablet Take 3 tablets (60 mg total) by mouth daily.  . traZODone (DESYREL) 50 MG tablet Take 1 tablet (50 mg total) by mouth at bedtime as needed for sleep.     Allergies: No Known Allergies  Social History: The patient  reports that  has never smoked. he has never  used smokeless tobacco. He reports that he does not drink alcohol or use drugs.   Family History: [name] family history is not on file.   Review of Systems: Please see the history of present illness.   Otherwise, the review of systems is positive for none.   All other systems are reviewed and negative.   Physical Exam: VS:  BP (!) 150/50 (BP Location: Left Arm, Patient Position: Sitting, Cuff Size: Normal)   Pulse (!) 46   Ht 5' 10.5" (1.791 m)   Wt 241 lb (109.3 kg)   BMI 34.09 kg/m  .  BMI Body mass index is 34.09 kg/m.  Wt Readings from Last 3 Encounters:  09/29/17 241 lb (109.3 kg)  09/23/17 240 lb 6.4 oz (109 kg)  05/08/15 242 lb (109.8 kg)    General: Pleasant. Elderly male. Alert and in no acute distress.   HEENT: Normal.  Neck: Supple, no JVD, carotid bruits, or masses noted.  Cardiac: Irregular on exam and slow. Legs are wrapped bilaterally. No significant edema.  Respiratory:  Lungs are clear to auscultation bilaterally with normal work of breathing.  GI: Soft and nontender.  MS: No deformity or atrophy. Gait and ROM intact.  Skin: Warm and dry. Color is normal.  Neuro:  Strength and sensation are intact and no gross focal deficits noted.  Psych: Alert, appropriate and with normal affect.   LABORATORY DATA:  EKG:  EKG is ordered today. This demonstrates 2nd degree AV block Mobitz 1. HR is 46.   Lab Results  Component Value Date   WBC 5.5 09/22/2017   HGB 13.1 09/22/2017   HCT 40.3 09/22/2017   PLT 155 09/22/2017   GLUCOSE 125 (H) 09/23/2017   CHOL 92 11/11/2011   TRIG 113 11/11/2011   ALT 28 09/22/2017   AST 38 09/22/2017   NA 140 09/23/2017   K 4.8 09/23/2017   CL 105 09/23/2017   CREATININE 2.23 (H) 09/23/2017   BUN 55 (H) 09/23/2017   CO2 26 09/23/2017   TSH 3.039 09/13/2017   HGBA1C 6.3 (H) 09/18/2017     BNP (last 3 results) Recent Labs    09/13/17 1622  09/22/17 0522  BNP 518.8* 488.5*    ProBNP (last 3 results) No results  for input(s): PROBNP in the last 8760 hours.   Other Studies Reviewed Today:   Lower extremity venous dopplers 09/18/17 Right: There is no evidence of deep vein thrombosis in the lower extremity. No cystic structure found in the popliteal fossa. Left: There is no evidence of deep vein thrombosis in the lower extremity. No cystic structure found in the popliteal fossa.  Echocardiogram 09/14/17 Study Conclusions  - Left ventricle: The cavity size was normal. Wall thickness was increased in a pattern of mild LVH. Systolic function was vigorous. The estimated ejection fraction was in the range of65% to 70%. - Mitral valve: There was mild regurgitation.    Assessment/Plan:  1. Chronic diastolic HF - looks better - weight down per home scales. Reminded about salt restriction. Lab today.   2. 2nd degree AV block Mobitz 1 - HR is 46 - not on any AV nodal blocking agents. To see EP again this Friday here for discussion about PPM implant. Noted that he has had conduction system disease dating back to 2013. Not dizzy or lightheaded. No syncope reported. Precautions and when to return to the hospital given/reminded.   3. HTN - BP is fair - no changes made today.   4. Bilateral leg ulcers - in unna boots. Home health to see  5. CKD - lab today  6. Advanced age  Current medicines are reviewed with the patient today.  The patient does not have concerns regarding medicines other than what has been noted above.  The following changes have been made:  See above.  Labs/ tests ordered today include:    Orders Placed This Encounter  Procedures  . Basic metabolic panel  . CBC  . Pro b natriuretic peptide (BNP)  . EKG 12-Lead     Disposition:   FU with Dr. Elberta Fortis on Friday as planned with repeat EKG   Patient is agreeable to this plan and will call if any problems develop in the interim.   SignedNorma Fredrickson, NP  09/29/2017 10:39 AM  [facility] [address] [place], [place]  [address] Phone: [phone] Fax: [phone]

## 2017-09-29 NOTE — Patient Instructions (Addendum)
We will be checking the following labs today - BMET, CBC and BNP   Medication Instructions:    Continue with your current medicines.     Testing/Procedures To Be Arranged:  N/A  Follow-Up:   See Dr. Elberta Fortisamnitz as planned on Friday    Other Special Instructions:   Call us for any dizzy spells. Go back to the hospital if you have any passing out spells.     If you need a refill on your cardiac medications before your next appointment, please call your pharmacy.   Call the [facility] office at [phone] if you have any questions, problems or concerns.

## 2017-09-30 ENCOUNTER — Other Ambulatory Visit: Payer: Self-pay | Admitting: *Deleted

## 2017-09-30 DIAGNOSIS — N19 Unspecified kidney failure: Secondary | ICD-10-CM

## 2017-09-30 MED ORDER — TORSEMIDE 20 MG PO TABS: 40.0000 mg | ORAL_TABLET | Freq: Every day | ORAL | 9 refills | Status: DC

## 2017-10-01 NOTE — Progress Notes (Signed)
Electrophysiology Office Note   Date:  10/03/2017   ID:  Ronald Lindsey, DOB October 03, 1924, MRN [id number]  PCP:  Renford Dills, MD  Cardiologist:   Primary Electrophysiologist:  Sui Kasparek Jorja Loa, MD    Chief Complaint  Patient presents with  . Follow-up    Bradycardia/ Mobitz 1     History of Present Illness: Ronald Lindsey is a 84 y.o. male who is being seen today for the evaluation of Mobitz 1 AV block at the request of Renford Dills, MD. Presenting today for electrophysiology evaluation. History of diastolic HF, DM, HTN, Mobitz I.   Today, he denies symptoms of palpitations, chest pain,  PND, claudication, dizziness, presyncope, syncope, bleeding, or neurologic sequela. The patient is tolerating medications without difficulties.  His main symptoms are of shortness of breath.  He gets short of breath with any kind of exertion.  He is comfortable at rest.   Past Medical History:  Diagnosis Date  . Acute diastolic CHF (congestive heart failure), NYHA class 3 (HCC) 09/13/2017  . Back pain   . Cellulitis of right leg 11/06/2011  . CHF (congestive heart failure) (HCC) 09/13/2017  . Diabetes mellitus   . Diabetic neuropathy (HCC)   . Diabetic retinopathy   . DM (diabetes mellitus) (HCC) 11/06/2011  . Gastroenteritis 11/06/2011  . GERD (gastroesophageal reflux disease)   . HTN (hypertension)   . Macular degeneration   . Mobitz (type) I (Wenckebach's) atrioventricular block 09/16/2017  . Pleural effusion 05/08/2015  . Previous back surgery 11/06/2011  . Renal failure 11/06/2011  . S/P appendectomy 11/06/2011   Past Surgical History:  Procedure Laterality Date  . BRAIN SURGERY    . CATARACT EXTRACTION     bilateral eyes  . COLONOSCOPY  11/08/2011   Procedure: COLONOSCOPY;  Surgeon: Graylin Shiver, MD;  Location: [facility];  Service: Endoscopy;  Laterality: N/A;  with decompression  . IMPLANTABLE CONTACT LENS IMPLANTATION     RT eye successful, LT eye is difficult to see - pt  states     Current Outpatient Medications  Medication Sig Dispense Refill  . acetaminophen (TYLENOL) 325 MG tablet Take 2 tablets (650 mg total) by mouth every 6 (six) hours as needed for mild pain or headache. 10 tablet 0  . aspirin EC 81 MG tablet Take 81 mg by mouth daily.    . hydrocerin (EUCERIN) CREA Apply 1 application topically 2 (two) times a week. 113 g 0  . insulin aspart (NOVOLOG) 100 UNIT/ML injection Insulin sliding scale: Blood sugar  120-150   3units                       151-200   4units                       201-250   7units                       251- 300  11units                       301-350   15uints                       351-400   20units                       >400  call MD immediately 10 mL 0  . LANTUS SOLOSTAR 100 UNIT/ML Solostar Pen Inject 15 Units into the skin daily at 10 pm. 15 mL 0  . lisinopril (PRINIVIL,ZESTRIL) 5 MG tablet Take 1 tablet (5 mg total) by mouth daily. 30 tablet 0  . Multiple Vitamins-Minerals (PRESERVISION AREDS 2 PO) Take 1 tablet by mouth 2 (two) times daily.    Marland Kitchen torsemide (DEMADEX) 20 MG tablet Take 2 tablets (40 mg total) by mouth daily. 30 tablet 9  . traZODone (DESYREL) 50 MG tablet Take 1 tablet (50 mg total) by mouth at bedtime as needed for sleep. 10 tablet 0   No current facility-administered medications for this visit.     Allergies:   Patient has no known allergies.   Social History:  The patient  reports that  has never smoked. he has never used smokeless tobacco. He reports that he does not drink alcohol or use drugs.   Family History:  [name] family history includes Cancer in his mother; Heart disease in his father; Obesity in his sister.    ROS:  Please see the history of present illness.   Otherwise, review of systems is positive for swelling, shortness of breath, visual change, snoring, wheezing.   All other systems are reviewed and negative.    PHYSICAL EXAM: VS:  BP 140/60   Pulse (!) 46   Ht 5'  10.5" (1.791 m)   Wt 234 lb 12.8 oz (106.5 kg)   BMI 33.21 kg/m  , BMI Body mass index is 33.21 kg/m. GEN: Well nourished, well developed, in no acute distress  HEENT: normal  Neck: no JVD, carotid bruits, or masses Cardiac: RRR; no murmurs, rubs, or gallops,no edema  Respiratory:  clear to auscultation bilaterally, normal work of breathing GI: soft, nontender, nondistended, + BS MS: no deformity or atrophy  Skin: warm and dry Neuro:  Strength and sensation are intact Psych: euthymic mood, full affect  EKG:  EKG is ordered today. Personal review of the ekg ordered shows sinus rhythm, mobitz 1 AV block, rate 46, RBBB, inferorseptal infarct  Recent Labs: 09/13/2017: TSH 3.039 09/19/2017: Magnesium 2.4 09/22/2017: ALT 28; B Natriuretic Peptide 488.5 09/29/2017: BUN 74; Creatinine, Ser 2.51; Hemoglobin 13.9; NT-Pro BNP 2,151; Platelets 161; Potassium 5.0; Sodium 140    Lipid Panel     Component Value Date/Time   CHOL 92 11/11/2011 0430   TRIG 113 11/11/2011 0430     Wt Readings from Last 3 Encounters:  10/03/17 234 lb 12.8 oz (106.5 kg)  09/29/17 241 lb (109.3 kg)  09/23/17 240 lb 6.4 oz (109 kg)      Other studies Reviewed: Additional studies/ records that were reviewed today include: TTE 09/14/17  Review of the above records today demonstrates:  - Left ventricle: The cavity size was normal. Wall thickness was   increased in a pattern of mild LVH. Systolic function was   vigorous. The estimated ejection fraction was in the range of 65%   to 70%. - Mitral valve: There was mild regurgitation.   ASSESSMENT AND PLAN:  1.  Mobitz 1 AV block: Is having current symptoms of shortness of breath.  I discussed with him and his family the option of pacemaker implant.  Risks and benefits include bleeding, tamponade, infection, pneumothorax.  The patient understands these risks and is agreed to the procedure.  2.  Hypertension: Blood pressure well controlled.  No changes.  3.   Chronic diastolic heart failure: Weight has been coming down.  Diuresing well.  No changes.  4.  Bilateral leg ulcers: An Radio broadcast assistantUnna boot.  Home health is currently evaluating.  No active signs of infection and not on antibiotics.  At this point I do not feel that this should keep him from having his pacemaker implanted.    Current medicines are reviewed at length with the patient today.   The patient does not have concerns regarding his medicines.  The following changes were made today:  none  Labs/ tests ordered today include:  Orders Placed This Encounter  Procedures  . EKG 12-Lead     Disposition:   FU with Roch Quach 3 months  Signed, Len Kluver Jorja LoaMartin Chelsee Hosie, MD  10/03/2017 1:01 PM     [facility] [address] [place] [address] [phone] (office) [phone] (fax)

## 2017-10-03 ENCOUNTER — Other Ambulatory Visit: Payer: Self-pay | Admitting: Cardiology

## 2017-10-03 ENCOUNTER — Ambulatory Visit: Payer: Medicare Other | Admitting: Cardiology

## 2017-10-03 ENCOUNTER — Other Ambulatory Visit: Payer: Medicare Other | Admitting: *Deleted

## 2017-10-03 ENCOUNTER — Encounter: Payer: Self-pay | Admitting: Cardiology

## 2017-10-03 VITALS — BP 140/60 | HR 46 | Ht 70.5 in | Wt 234.8 lb

## 2017-10-03 DIAGNOSIS — I441 Atrioventricular block, second degree: Secondary | ICD-10-CM

## 2017-10-03 DIAGNOSIS — I5032 Chronic diastolic (congestive) heart failure: Secondary | ICD-10-CM | POA: Diagnosis not present

## 2017-10-03 DIAGNOSIS — I10 Essential (primary) hypertension: Secondary | ICD-10-CM | POA: Diagnosis not present

## 2017-10-03 DIAGNOSIS — N19 Unspecified kidney failure: Secondary | ICD-10-CM

## 2017-10-03 NOTE — Patient Instructions (Signed)
Medication Instructions:  Your physician recommends that you continue on your current medications as directed. Please refer to the Current Medication list given to you today.     * If you need a refill on your cardiac medications before your next appointment, please call your pharmacy. *   Labwork: You will have pre procedure lab work in the hospital the morning of your procedure.  Testing/Procedures: Your physician has recommended that you have a pacemaker inserted. A pacemaker is a small device that is placed under the skin of your chest or abdomen to help control abnormal heart rhythms. This device uses electrical pulses to prompt the heart to beat at a normal rate. Pacemakers are used to treat heart rhythms that are too slow. Wire (leads) are attached to the pacemaker that goes into the chambers of you heart. This is done in the hospital and usually requires and overnight stay. Please follow the instructions below, located under the special instructions section.   Follow-Up: Your physician recommends that you schedule a wound check appointment 10-14 days, after your procedure on 10/14/2017, with the device clinic.  Your physician recommends that you schedule a follow up appointment in 91 days, after your procedure on 10/14/2017, with Dr. Elberta Fortis.  * Please note that any paperwork needing to be filled out by the provider will need to be addressed at the front desk prior to seeing the provider.  Please note that any FMLA, disability or other documents regarding health condition is subject to a $25.00 charge that must be received prior to completion of paperwork in the form of a money order or check. *  Thank you for choosing [facility]!!   Dory Horn, RN [phone]   Any Other Special Instructions Will Be Listed Below (If Applicable).     Implantable Device Instructions  You are scheduled for:                  _____ Permanent Transvenous Pacemaker  on  10/14/2017  with  Dr. Elberta Fortis.  1.   Please arrive at the [facility], Entrance "A"  at [facility] at  12:00 p.m. on the day of your procedure. (The address is [address])  2. Do not eat or drink after midnight the night before your procedure.  3.   You will have lab work in the hospital the morning of your procedure.  4.   A) Take 1/2 the usual dose of bedtime insulin the night before your procedure.        B)  Hold all of your morning medications the morning of your procedure.  5.  Plan for an overnight stay.  Bring your insurance cards and a list of you medications.  6.  Wash your chest and neck with surgical scrub the evening before and the morning of      your procedure.  Rinse well. Please review the surgical scrub instruction sheet given       to you.  7. Your chest will need to be shaved prior to this procedure (if needed). We ask that you do this yourself at home 1 to 2 days before or if uncomfortable/unable to do yourself, then it will be performed by the hospital staff the day of.                                                                                                                 *  If you have ANY questions after you get home, please call Dory Horn, RN @ [phone].  * Every attempt is made to prevent procedures from being rescheduled.  Due to the nature of  Electrophysiology, rescheduling can happen.  The physician is always aware and directs the staff when this occurs.     Pacemaker Implantation, Adult Pacemaker implantation is a procedure to place a pacemaker inside your chest. A pacemaker is a small computer that sends electrical signals to the heart and helps your heart beat normally. A pacemaker also stores information about your heart rhythms. You may need pacemaker implantation if you:  Have a slow heartbeat (bradycardia).  Faint (syncope).  Have shortness of breath (dyspnea) due to heart problems.  The pacemaker attaches to your heart  through a wire, called a lead. Sometimes just one lead is needed. Other times, there will be two leads. There are two types of pacemakers:  Transvenous pacemaker. This type is placed under the skin or muscle of your chest. The lead goes through a vein in the chest area to reach the inside of the heart.  Epicardial pacemaker. This type is placed under the skin or muscle of your chest or belly. The lead goes through your chest to the outside of the heart.  Tell a health care provider about:  Any allergies you have.  All medicines you are taking, including vitamins, herbs, eye drops, creams, and over-the-counter medicines.  Any problems you or family members have had with anesthetic medicines.  Any blood or bone disorders you have.  Any surgeries you have had.  Any medical conditions you have.  Whether you are pregnant or may be pregnant. What are the risks? Generally, this is a safe procedure. However, problems may occur, including:  Infection.  Bleeding.  Failure of the pacemaker or the lead.  Collapse of a lung or bleeding into a lung.  Blood clot inside a blood vessel with a lead.  Damage to the heart.  Infection inside the heart (endocarditis).  Allergic reactions to medicines.  What happens before the procedure? Staying hydrated Follow instructions from your health care provider about hydration, which may include:  Up to 2 hours before the procedure - you may continue to drink clear liquids, such as water, clear fruit juice, black coffee, and plain tea.  Eating and drinking restrictions Follow instructions from your health care provider about eating and drinking, which may include:  8 hours before the procedure - stop eating heavy meals or foods such as meat, fried foods, or fatty foods.  6 hours before the procedure - stop eating light meals or foods, such as toast or cereal.  6 hours before the procedure - stop drinking milk or drinks that contain milk.  2  hours before the procedure - stop drinking clear liquids.  Medicines  Ask your health care provider about: ? Changing or stopping your regular medicines. This is especially important if you are taking diabetes medicines or blood thinners. ? Taking medicines such as aspirin and ibuprofen. These medicines can thin your blood. Do not take these medicines before your procedure if your health care provider instructs you not to.  You may be given antibiotic medicine to help prevent infection. General instructions  You will have a heart evaluation. This may include an electrocardiogram (ECG), chest X-ray, and heart imaging (echocardiogram,  or echo) tests.  You will have blood tests.  Do not use any products that contain nicotine or tobacco, such as cigarettes and  e-cigarettes. If you need help quitting, ask your health care provider.  Plan to have someone take you home from the hospital or clinic.  If you will be going home right after the procedure, plan to have someone with you for 24 hours.  Ask your health care provider how your surgical site will be marked or identified. What happens during the procedure?  To reduce your risk of infection: ? Your health care team will wash or sanitize their hands. ? Your skin will be washed with soap. ? Hair may be removed from the surgical area.  An IV tube will be inserted into one of your veins.  You will be given one or more of the following: ? A medicine to help you relax (sedative). ? A medicine to numb the area (local anesthetic). ? A medicine to make you fall asleep (general anesthetic).  If you are getting a transvenous pacemaker: ? An incision will be made in your upper chest. ? A pocket will be made for the pacemaker. It may be placed under the skin or between layers of muscle. ? The lead will be inserted into a blood vessel that returns to the heart. ? While X-rays are taken by an imaging machine (fluoroscopy), the lead will be  advanced through the vein to the inside of your heart. ? The other end of the lead will be tunneled under the skin and attached to the pacemaker.  If you are getting an epicardial pacemaker: ? An incision will be made near your ribs or breastbone (sternum) for the lead. ? The lead will be attached to the outside of your heart. ? Another incision will be made in your chest or upper belly to create a pocket for the pacemaker. ? The free end of the lead will be tunneled under the skin and attached to the pacemaker.  The transvenous or epicardial pacemaker will be tested. Imaging studies may be done to check the lead position.  The incisions will be closed with stitches (sutures), adhesive strips, or skin glue.  Bandages (dressing) will be placed over the incisions. The procedure may vary among health care providers and hospitals. What happens after the procedure?  Your blood pressure, heart rate, breathing rate, and blood oxygen level will be monitored until the medicines you were given have worn off.  You will be given antibiotics and pain medicine.  ECG and chest x-rays will be done.  You will wear a continuous type of ECG (Holter monitor) to check your heart rhythm.  Your health care provider willprogram the pacemaker.  Do not drive for 24 hours if you received a sedative. This information is not intended to replace advice given to you by your health care provider. Make sure you discuss any questions you have with your health care provider. Document Released: 07/05/2002 Document Revised: 02/02/2016 Document Reviewed: 12/27/2015 Elsevier Interactive Patient Education  2018 [employer]     Pacemaker Implantation, Adult, Care After This sheet gives you information about how to care for yourself after your procedure. Your health care provider may also give you more specific instructions. If you have problems or questions, contact your health care provider. What can I expect after  the procedure? After the procedure, it is common to have:  Mild pain.  Slight bruising.  Some swelling over the incision.  A slight bump over the skin where the device was placed. Sometimes, it is possible to feel the device under the skin. This is normal.  Follow these  instructions at home: Medicines  Take over-the-counter and prescription medicines only as told by your health care provider.  If you were prescribed an antibiotic medicine, take it as told by your health care provider. Do not stop taking the antibiotic even if you start to feel better. Wound care  Do not remove the bandage on your chest until directed to do so by your health care provider.  After your bandage is removed, you may see pieces of tape called skin adhesive strips over the area where the cut was made (incision site). Let them fall off on their own.  Check the incision site every day to make sure it is not infected, bleeding, or starting to pull apart.  Do not use lotions or ointments near the incision site unless directed to do so.  Keep the incision area clean and dry for 2-3 days after the procedure or as directed by your health care provider. It takes several weeks for the incision site to completely heal.  Do not take baths, swim, or use a hot tub for 7-10 days or as otherwise directed by your health care provider. Activity  Do not drive or use heavy machinery while taking prescription pain medicine.  Do not drive for 24 hours if you were given a medicine to help you relax (sedative).  Check with your health care provider before you start to drive or play sports.  Avoid sudden jerking, pulling, or chopping movements that pull your upper arm far away from your body. Avoid these movements for at least 6 weeks or as long as told by your health care provider.  Do not lift your upper arm above your shoulders for at least 6 weeks or as long as told by your health care provider. This means no tennis,  golf, or swimming.  You may go back to work when your health care provider says it is okay. Pacemaker care  You may be shown how to transfer data from your pacemaker through the phone to your health care provider.  Always let all health care providers know about your pacemaker before you have any medical procedures or tests.  Wear a medical ID bracelet or necklace stating that you have a pacemaker. Carry a pacemaker ID card with you at all times.  Your pacemaker battery will last for 5-15 years. Routine checks by your health care provider will let the health care provider know when the battery is starting to run down. The pacemaker will need to be replaced when the battery starts to run down.  Do not use amateur Proofreader. Other electrical devices are safe to use, including power tools, lawn mowers, and speakers. If you are unsure of whether something is safe to use, ask your health care provider.  When using your cell phone, hold it to the ear opposite the pacemaker. Do not leave your cell phone in a pocket over the pacemaker.  Avoid places or objects that have a strong electric or magnetic field, including: ? Airport Actuary. When at the airport, let officials know that you have a pacemaker. ? Power plants. ? Large electrical generators. ? Radiofrequency transmission towers, such as cell phone and radio towers. General instructions  Weigh yourself every day. If you suddenly gain weight, fluid may be building up in your body.  Keep all follow-up visits as told by your health care provider. This is important. Contact a health care provider if:  You gain weight suddenly.  Your legs  or feet swell.  It feels like your heart is fluttering or skipping beats (heart palpitations).  You have chills or a fever.  You have more redness, swelling, or pain around your incisions.  You have more fluid or blood coming from your incisions.  Your  incisions feel warm to the touch.  You have pus or a bad smell coming from your incisions. Get help right away if:  You have chest pain.  You have trouble breathing or are short of breath.  You become extremely tired.  You are light-headed or you faint. This information is not intended to replace advice given to you by your health care provider. Make sure you discuss any questions you have with your health care provider. Document Released: 02/01/2005 Document Revised: 04/26/2016 Document Reviewed: 04/26/2016 Elsevier Interactive Patient Education  2018 [employer].    Supplemental Discharge Instructions for  Pacemaker/Defibrillator Patients  Activity No heavy lifting or vigorous activity with your left/right arm for 6 to 8 weeks.  Do not raise your left/right arm above your head for one week.  Gradually raise your affected arm as drawn below.           __  NO DRIVING for     ; you may begin driving on     .  WOUND CARE - Keep the wound area clean and dry.  Do not get this area wet for one week. No showers for one week; you may shower on     . - The tape/steri-strips on your wound will fall off; do not pull them off.  No bandage is needed on the site.  DO  NOT apply any creams, oils, or ointments to the wound area. - If you notice any drainage or discharge from the wound, any swelling or bruising at the site, or you develop a fever > 101? F after you are discharged home, call the office at once.  Special Instructions - You are still able to use cellular telephones; use the ear opposite the side where you have your pacemaker/defibrillator.  Avoid carrying your cellular phone near your device. - When traveling through airports, show security personnel your identification card to avoid being screened in the metal detectors.  Ask the security personnel to use the hand wand. - Avoid arc welding equipment, MRI testing (magnetic resonance imaging), TENS units (transcutaneous nerve  stimulators).  Call the office for questions about other devices. - Avoid electrical appliances that are in poor condition or are not properly grounded. - Microwave ovens are safe to be near or to operate.  Additional information for defibrillator patients should your device go off: - If your device goes off ONCE and you feel fine afterward, notify the device clinic nurses. - If your device goes off ONCE and you do not feel well afterward, call 911. - If your device goes off TWICE, call 911. - If your device goes off THREE times in one day, call 911.  DO NOT DRIVE YOURSELF OR A FAMILY MEMBER WITH A DEFIBRILLATOR TO THE HOSPITAL-CALL 911.

## 2017-10-04 LAB — BASIC METABOLIC PANEL
BUN/Creatinine Ratio: 31 — ABNORMAL HIGH (ref 10–24)
BUN: 80 mg/dL (ref 10–36)
CO2: 21 mmol/L (ref 20–29)
Calcium: 9 mg/dL (ref 8.6–10.2)
Chloride: 104 mmol/L (ref 96–106)
Creatinine, Ser: 2.6 mg/dL — ABNORMAL HIGH (ref 0.76–1.27)
GFR calc Af Amer: 24 mL/min/{1.73_m2} — ABNORMAL LOW (ref >59)
GFR calc non Af Amer: 21 mL/min/{1.73_m2} — ABNORMAL LOW (ref >59)
Glucose: 126 mg/dL — ABNORMAL HIGH (ref 65–99)
Potassium: 5 mmol/L (ref 3.5–5.2)
Sodium: 140 mmol/L (ref 134–144)

## 2017-10-07 ENCOUNTER — Other Ambulatory Visit: Payer: Self-pay

## 2017-10-07 DIAGNOSIS — I5031 Acute diastolic (congestive) heart failure: Secondary | ICD-10-CM

## 2017-10-07 MED ORDER — TORSEMIDE 20 MG PO TABS: 20.0000 mg | ORAL_TABLET | Freq: Every day | ORAL | 9 refills | Status: DC

## 2017-10-10 ENCOUNTER — Other Ambulatory Visit: Payer: Medicare Other

## 2017-10-10 DIAGNOSIS — I5031 Acute diastolic (congestive) heart failure: Secondary | ICD-10-CM

## 2017-10-11 LAB — BASIC METABOLIC PANEL
BUN/Creatinine Ratio: 28 — ABNORMAL HIGH (ref 10–24)
BUN: 64 mg/dL — ABNORMAL HIGH (ref 10–36)
CO2: 23 mmol/L (ref 20–29)
Calcium: 8.9 mg/dL (ref 8.6–10.2)
Chloride: 106 mmol/L (ref 96–106)
Creatinine, Ser: 2.3 mg/dL — ABNORMAL HIGH (ref 0.76–1.27)
GFR calc Af Amer: 28 mL/min/{1.73_m2} — ABNORMAL LOW (ref >59)
GFR calc non Af Amer: 24 mL/min/{1.73_m2} — ABNORMAL LOW (ref >59)
Glucose: 177 mg/dL — ABNORMAL HIGH (ref 65–99)
Potassium: 5.7 mmol/L — ABNORMAL HIGH (ref 3.5–5.2)
Sodium: 140 mmol/L (ref 134–144)

## 2017-10-13 ENCOUNTER — Other Ambulatory Visit: Payer: Self-pay | Admitting: *Deleted

## 2017-10-13 DIAGNOSIS — N19 Unspecified kidney failure: Secondary | ICD-10-CM

## 2017-10-13 MED ORDER — TORSEMIDE 20 MG PO TABS: 20.0000 mg | ORAL_TABLET | ORAL | 9 refills | Status: DC

## 2017-10-14 ENCOUNTER — Encounter (HOSPITAL_COMMUNITY): Payer: Self-pay | Admitting: General Practice

## 2017-10-14 ENCOUNTER — Encounter (HOSPITAL_COMMUNITY)
Admission: RE | Disposition: A | Discharge status: Home / Self Care | Payer: Self-pay | Source: Ambulatory Visit | Attending: Cardiology

## 2017-10-14 ENCOUNTER — Ambulatory Visit (HOSPITAL_COMMUNITY)
Admission: RE | Admit: 2017-10-14 | Discharge: 2017-10-15 | Disposition: A | Discharge status: Home / Self Care | Payer: Medicare Other | Source: Ambulatory Visit | Attending: Cardiology | Admitting: Cardiology

## 2017-10-14 ENCOUNTER — Other Ambulatory Visit: Payer: Self-pay

## 2017-10-14 DIAGNOSIS — Z79899 Other long term (current) drug therapy: Secondary | ICD-10-CM | POA: Insufficient documentation

## 2017-10-14 DIAGNOSIS — Z7982 Long term (current) use of aspirin: Secondary | ICD-10-CM | POA: Diagnosis not present

## 2017-10-14 DIAGNOSIS — I440 Atrioventricular block, first degree: Secondary | ICD-10-CM | POA: Diagnosis not present

## 2017-10-14 DIAGNOSIS — I441 Atrioventricular block, second degree: Secondary | ICD-10-CM | POA: Diagnosis present

## 2017-10-14 DIAGNOSIS — Z794 Long term (current) use of insulin: Secondary | ICD-10-CM | POA: Insufficient documentation

## 2017-10-14 DIAGNOSIS — I110 Hypertensive heart disease with heart failure: Secondary | ICD-10-CM | POA: Diagnosis not present

## 2017-10-14 DIAGNOSIS — R112 Nausea with vomiting, unspecified: Secondary | ICD-10-CM | POA: Diagnosis not present

## 2017-10-14 DIAGNOSIS — Z95818 Presence of other cardiac implants and grafts: Secondary | ICD-10-CM

## 2017-10-14 DIAGNOSIS — R001 Bradycardia, unspecified: Secondary | ICD-10-CM | POA: Insufficient documentation

## 2017-10-14 DIAGNOSIS — E119 Type 2 diabetes mellitus without complications: Secondary | ICD-10-CM | POA: Diagnosis not present

## 2017-10-14 DIAGNOSIS — Z950 Presence of cardiac pacemaker: Secondary | ICD-10-CM

## 2017-10-14 DIAGNOSIS — I5032 Chronic diastolic (congestive) heart failure: Secondary | ICD-10-CM | POA: Diagnosis not present

## 2017-10-14 HISTORY — DX: Exudative age-related macular degeneration, bilateral, stage unspecified: H35.3230

## 2017-10-14 HISTORY — DX: Unspecified osteoarthritis, unspecified site: M19.90

## 2017-10-14 HISTORY — DX: Chronic kidney disease, stage 3 unspecified: N18.30

## 2017-10-14 HISTORY — DX: Presence of cardiac pacemaker: Z95.0

## 2017-10-14 HISTORY — DX: Type 2 diabetes mellitus with unspecified diabetic retinopathy without macular edema: E11.319

## 2017-10-14 HISTORY — DX: Type 2 diabetes mellitus without complications: E11.9

## 2017-10-14 HISTORY — DX: Chronic kidney disease, stage 3 (moderate): N18.3

## 2017-10-14 HISTORY — PX: INSERT / REPLACE / REMOVE PACEMAKER: SUR710

## 2017-10-14 HISTORY — PX: PACEMAKER IMPLANT: EP1218

## 2017-10-14 LAB — BASIC METABOLIC PANEL
Anion gap: 8 (ref 5–15)
BUN: 58 mg/dL — ABNORMAL HIGH (ref 6–20)
CALCIUM: 9.1 mg/dL (ref 8.9–10.3)
CO2: 23 mmol/L (ref 22–32)
CREATININE: 2.27 mg/dL — AB (ref 0.61–1.24)
Chloride: 106 mmol/L (ref 101–111)
GFR calc non Af Amer: 24 mL/min — ABNORMAL LOW (ref >60)
GFR, EST AFRICAN AMERICAN: 28 mL/min — AB (ref >60)
GLUCOSE: 176 mg/dL — AB (ref 65–99)
Potassium: 5.4 mmol/L — ABNORMAL HIGH (ref 3.5–5.1)
Sodium: 137 mmol/L (ref 135–145)

## 2017-10-14 LAB — SURGICAL PCR SCREEN
MRSA, PCR: NEGATIVE
Staphylococcus aureus: NEGATIVE

## 2017-10-14 LAB — GLUCOSE, CAPILLARY
GLUCOSE-CAPILLARY: 159 mg/dL — AB (ref 65–99)
GLUCOSE-CAPILLARY: 165 mg/dL — AB (ref 65–99)
Glucose-Capillary: 150 mg/dL — ABNORMAL HIGH (ref 65–99)

## 2017-10-14 SURGERY — PACEMAKER IMPLANT

## 2017-10-14 MED ORDER — CEFAZOLIN SODIUM-DEXTROSE 1-4 GM/50ML-% IV SOLN
1.0000 g | Freq: Two times a day (BID) | INTRAVENOUS | Status: AC
  Administered 2017-10-15: 02:00:00 1 g via INTRAVENOUS
  Filled 2017-10-14 (×2): qty 50

## 2017-10-14 MED ORDER — CEFAZOLIN SODIUM-DEXTROSE 2-4 GM/100ML-% IV SOLN
2.0000 g | INTRAVENOUS | Status: AC
  Administered 2017-10-14: 2 g via INTRAVENOUS

## 2017-10-14 MED ORDER — LIDOCAINE HCL (PF) 1 % IJ SOLN
INTRAMUSCULAR | Status: AC
  Filled 2017-10-14: qty 60

## 2017-10-14 MED ORDER — SODIUM CHLORIDE 0.9% FLUSH: 3.0000 mL | INTRAVENOUS | Status: DC | PRN

## 2017-10-14 MED ORDER — ONDANSETRON HCL 4 MG/2ML IJ SOLN
4.0000 mg | Freq: Four times a day (QID) | INTRAMUSCULAR | Status: DC | PRN
  Administered 2017-10-14 – 2017-10-15 (×2): 4 mg via INTRAVENOUS
  Filled 2017-10-14 (×2): qty 2

## 2017-10-14 MED ORDER — YOU HAVE A PACEMAKER BOOK
Freq: Once | Status: AC
  Administered 2017-10-15: 02:00:00
  Filled 2017-10-14: qty 1

## 2017-10-14 MED ORDER — MUPIROCIN 2 % EX OINT
1.0000 "application " | TOPICAL_OINTMENT | Freq: Once | CUTANEOUS | Status: AC
  Administered 2017-10-14: 1 via TOPICAL

## 2017-10-14 MED ORDER — HEPARIN (PORCINE) IN NACL 2-0.9 UNIT/ML-% IJ SOLN
INTRAMUSCULAR | Status: AC
  Filled 2017-10-14: qty 500

## 2017-10-14 MED ORDER — FENTANYL CITRATE (PF) 100 MCG/2ML IJ SOLN
INTRAMUSCULAR | Status: DC | PRN
  Administered 2017-10-14: 25 ug via INTRAVENOUS

## 2017-10-14 MED ORDER — LIDOCAINE HCL (PF) 1 % IJ SOLN
INTRAMUSCULAR | Status: DC | PRN
Start: 2017-10-14 — End: 2017-10-14
  Administered 2017-10-14: 60 mL

## 2017-10-14 MED ORDER — INSULIN ASPART 100 UNIT/ML ~~LOC~~ SOLN: 0.0000 [IU] | Freq: Every day | SUBCUTANEOUS | Status: DC

## 2017-10-14 MED ORDER — SODIUM CHLORIDE 0.9 % IV SOLN: 250.0000 mL | INTRAVENOUS | Status: DC

## 2017-10-14 MED ORDER — SODIUM CHLORIDE 0.9 % IR SOLN
Status: AC
  Filled 2017-10-14: qty 2

## 2017-10-14 MED ORDER — ASPIRIN EC 81 MG PO TBEC
81.0000 mg | DELAYED_RELEASE_TABLET | Freq: Every day | ORAL | Status: DC
  Administered 2017-10-14 – 2017-10-15 (×2): 81 mg via ORAL
  Filled 2017-10-14 (×2): qty 1

## 2017-10-14 MED ORDER — INSULIN ASPART 100 UNIT/ML ~~LOC~~ SOLN
0.0000 [IU] | Freq: Three times a day (TID) | SUBCUTANEOUS | Status: DC
  Administered 2017-10-15: 5 [IU] via SUBCUTANEOUS
  Administered 2017-10-15: 07:00:00 3 [IU] via SUBCUTANEOUS

## 2017-10-14 MED ORDER — SODIUM CHLORIDE 0.9 % IR SOLN
80.0000 mg | Status: AC
  Administered 2017-10-14: 80 mg

## 2017-10-14 MED ORDER — HEPARIN (PORCINE) IN NACL 2-0.9 UNIT/ML-% IJ SOLN
INTRAMUSCULAR | Status: AC | PRN
  Administered 2017-10-14: 500 mL

## 2017-10-14 MED ORDER — TRAZODONE HCL 50 MG PO TABS
50.0000 mg | ORAL_TABLET | Freq: Every day | ORAL | Status: DC
  Administered 2017-10-14: 50 mg via ORAL
  Filled 2017-10-14: qty 1

## 2017-10-14 MED ORDER — INSULIN ASPART 100 UNIT/ML ~~LOC~~ SOLN
0.0000 [IU] | SUBCUTANEOUS | Status: DC
  Administered 2017-10-14: 3 [IU] via SUBCUTANEOUS

## 2017-10-14 MED ORDER — INSULIN GLARGINE 100 UNIT/ML ~~LOC~~ SOLN
15.0000 [IU] | Freq: Every day | SUBCUTANEOUS | Status: DC
  Administered 2017-10-14: 22:00:00 15 [IU] via SUBCUTANEOUS
  Filled 2017-10-14: qty 0.15

## 2017-10-14 MED ORDER — SODIUM CHLORIDE 0.9% FLUSH: 3.0000 mL | Freq: Two times a day (BID) | INTRAVENOUS | Status: DC

## 2017-10-14 MED ORDER — TORSEMIDE 20 MG PO TABS
20.0000 mg | ORAL_TABLET | ORAL | Status: DC
  Administered 2017-10-15: 20 mg via ORAL
  Filled 2017-10-14: qty 1

## 2017-10-14 MED ORDER — FENTANYL CITRATE (PF) 100 MCG/2ML IJ SOLN
INTRAMUSCULAR | Status: AC
  Filled 2017-10-14: qty 2

## 2017-10-14 MED ORDER — ACETAMINOPHEN 325 MG PO TABS: 325.0000 mg | ORAL_TABLET | ORAL | Status: DC | PRN

## 2017-10-14 MED ORDER — SODIUM CHLORIDE 0.9 % IV SOLN
INTRAVENOUS | Status: DC
  Administered 2017-10-14: 13:00:00 via INTRAVENOUS

## 2017-10-14 MED ORDER — LISINOPRIL 5 MG PO TABS
5.0000 mg | ORAL_TABLET | Freq: Every day | ORAL | Status: DC
  Administered 2017-10-14 – 2017-10-15 (×2): 5 mg via ORAL
  Filled 2017-10-14 (×2): qty 1

## 2017-10-14 MED ORDER — MUPIROCIN 2 % EX OINT
TOPICAL_OINTMENT | CUTANEOUS | Status: AC
  Administered 2017-10-14: 1 via TOPICAL
  Filled 2017-10-14: qty 22

## 2017-10-14 SURGICAL SUPPLY — 7 items
CABLE SURGICAL S-101-97-12 (CABLE) ×1 IMPLANT
LEAD TENDRIL MRI 52CM LPA1200M (Lead) ×1 IMPLANT
LEAD TENDRIL MRI 58CM LPA1200M (Lead) ×1 IMPLANT
PACEMAKER ASSURITY DR-RF (Pacemaker) ×1 IMPLANT
PAD DEFIB LIFELINK (PAD) ×1 IMPLANT
SHEATH CLASSIC 8F (SHEATH) ×2 IMPLANT
TRAY PACEMAKER INSERTION (PACKS) ×1 IMPLANT

## 2017-10-14 NOTE — H&P (Signed)
Ronald Lindsey has presented today for surgery, with the diagnosis of mobitz I av block, symptomatic bradycardia.  The various methods of treatment have been discussed with the patient and family. After consideration of risks, benefits and other options for treatment, the patient has consented to  Procedure(s): Pacemaker implant as a surgical intervention .  Risks include but not limited to bleeding, tamponade, heart block, stroke, damage to surrounding organs, among others. [name] history has been reviewed, patient examined, no change in status, stable for surgery.  I have reviewed the patient's chart and labs.  Questions were answered to the patient's satisfaction.    Ronald Lindsey Ronald Fortisamnitz, MD 10/14/2017 2:19 PM

## 2017-10-14 NOTE — Progress Notes (Signed)
PHARMACY NOTE:  ANTIMICROBIAL RENAL DOSAGE ADJUSTMENT  Current antimicrobial regimen includes a mismatch between antimicrobial dosage and estimated renal function.  As per policy approved by the Pharmacy & Therapeutics and Medical Executive Committees, the antimicrobial dosage will be adjusted accordingly.  Current antimicrobial dosage:  Ancef 1gm IV q6h x3 doses  Indication: surgical prophylaxis  Renal Function:  Estimated Creatinine Clearance: 26.6 mL/min (A) (by C-G formula based on SCr of 2.27 mg/dL (H)).     Antimicrobial dosage has been changed to:  Ancef 1gm IV q12h x1 dose  Additional comments:   Thank you for allowing pharmacy to be a part of this patient's care.  Harland GermanAndrew Brenyn Petrey, PharmD Clinical Pharmacist 10/14/2017 5:23 PM

## 2017-10-14 NOTE — Discharge Summary (Addendum)
ELECTROPHYSIOLOGY PROCEDURE DISCHARGE SUMMARY    Patient ID: Ronald Lindsey,  MRN: [id number], DOB/AGE: 04-06-1925 84 y.o.  Admit date: 10/14/2017 Discharge date: 10/15/17  Primary Care Physician: Renford Dills, MD  Electrophysiologist: Dr. Elberta Fortis  Primary Discharge Diagnosis:  1. Symptomatic bradycardia  Secondary Discharge Diagnosis:  1. Chronic CHF (diastolic) 2. DM 3. HTN  No Known Allergies   Procedures This Admission:  1.  Implantation of a SJM dual chamber PPM on 10/14/17 by Dr Elberta Fortis.  The patient received a Public house manager Assurity MRI  model [id number] (serial number  [id number] ) pacemaker, [employer] model [id number] (serial number  [id number]) right atrial lead and a [employer] model [id number] (serial number  [id number]) right ventricular lead  There were no immediate post procedure complications. 2.  CXR on 10/15/17 demonstrated no pneumothorax status post device implantation.   Brief HPI: Ronald Lindsey is a 84 y.o. male was referred to electrophysiology in the outpatient setting for consideration of PPM implantation.  Past medical history includes HTN, DM, diastolic CHF.  The patient has had symptomatic bradycardia without reversible causes identified.  Risks, benefits, and alternatives to PPM implantation were reviewed with the patient who wished to proceed.   Hospital Course:  The patient was admitted and underwent implantation of a PPM with details as outlined above.  He was monitored on telemetry overnight which demonstrated SR/VP.  Left chest was without hematoma or ecchymosis.  The device was interrogated and found to be functioning normally.  CXR was obtained and demonstrated no pneumothorax status post device implantation.  The patient denies any SOB, cough, he is encouraged OOB and ambulate as much as able to.  Wound care, arm mobility, and restrictions were reviewed with the patient.  The patient early this AM had N/V though has resolved, he has eaten and  ambulated and is feeling well, no CP, palpitations or SOB, he was examined by Dr. Elberta Fortis and considered stable for discharge to home.    Physical Exam: Vitals:   10/15/17 0204 10/15/17 0715 10/15/17 0755 10/15/17 0800  BP: (!) 114/42 (!) 106/41 (!) 120/51 (!) 112/47  Pulse: 76 79    Resp: (!) 26 (!) 22 (!) 36 18  Temp: (!) 97.4 F (36.3 C) 97.7 F (36.5 C)    TempSrc: Oral Oral    SpO2: 93% 97%    Weight: 231 lb 7.7 oz (105 kg)     Height:        GEN- The patient is well appearing, alert and oriented x 3 today.   HEENT: normocephalic, atraumatic; sclera clear, conjunctiva pink; hearing intact; oropharynx clear; neck supple, no JVP Lungs- CTA b/l, normal work of breathing.  No wheezes, rales, rhonchi Heart- RRR, no murmurs, rubs or gallops, PMI not laterally displaced GI- soft, non-tender, non-distended Extremities- b/l LE wrapped/unna boots MS- no significant deformity or atrophy Skin- warm and dry, no rash or lesion, left chest without hematoma/ecchymosis Psych- euthymic mood, full affect Neuro- no gross deficits   Labs:   Lab Results  Component Value Date   WBC 7.2 09/29/2017   HGB 13.9 09/29/2017   HCT 41.6 09/29/2017   MCV 103 (H) 09/29/2017   PLT 161 09/29/2017    Recent Labs  Lab 10/14/17 1239  NA 137  K 5.4*  CL 106  CO2 23  BUN 58*  CREATININE 2.27*  CALCIUM 9.1  GLUCOSE 176*    Discharge Medications:  Allergies as of 10/15/2017   No Known  Allergies     Medication List    TAKE these medications   acetaminophen 325 MG tablet Commonly known as:  TYLENOL Take 2 tablets (650 mg total) by mouth every 6 (six) hours as needed for mild pain or headache.   aspirin EC 81 MG tablet Take 81 mg by mouth daily.   hydrocerin Crea Apply 1 application topically 2 (two) times a week. Notes to patient:  No changes are being made to your therapy, please follow instructions as per original prescribing physican   insulin aspart 100 UNIT/ML injection Commonly  known as:  novoLOG Insulin sliding scale: Blood sugar  120-150   3units                       151-200   4units                       201-250   7units                       251- 300  11units                       301-350   15uints                       351-400   20units                       >400         call MD immediately   LANTUS SOLOSTAR 100 UNIT/ML Solostar Pen Generic drug:  Insulin Glargine Inject 15 Units into the skin daily at 10 pm.   lisinopril 5 MG tablet Commonly known as:  PRINIVIL,ZESTRIL Take 1 tablet (5 mg total) by mouth daily.   PRESERVISION AREDS 2 PO Take 1 tablet by mouth 2 (two) times daily.   torsemide 20 MG tablet Commonly known as:  DEMADEX Take 1 tablet (20 mg total) by mouth every other day.   traZODone 50 MG tablet Commonly known as:  DESYREL Take 1 tablet (50 mg total) by mouth at bedtime as needed for sleep. What changed:  when to take this       Disposition:  Home  Discharge Instructions    Diet - low sodium heart healthy   Complete by:  As directed    Increase activity slowly   Complete by:  As directed      Follow-up Information    [facility] [address] [facility] Follow up on 10/28/2017.   Specialty:  Cardiology Why:  2:30PM, lab draw 3:00PM, wound check visit Contact information: [address], [address] [place] [id number] [phone]       Regan Lemmingamnitz, Shawntelle Ungar Martin, MD Follow up on 01/16/2018.   Specialty:  Cardiology Why:  11:30AM Contact information: [address] [place] [address] [phone]           Duration of Discharge Encounter: Greater than 30 minutes including physician time.  SignedFrancis Dowse, Renee Ursuy, PA-C 10/15/2017 11:23 AM  I have seen and examined this patient with Francis Dowseenee Ursuy.  Agree with above, note added to reflect my findings.  On exam, RRR, no murmurs, lungs clear.  Dual-chamber pacemaker implanted yesterday for Mobitz AV block and symptomatic bradycardia.  Device  interrogation and chest x-ray without issue today.  The patient did have some nausea and vomiting after the  procedure but ambulated well without issue.  Plan for discharge today with follow-up in device clinic.  Jehan Bonano M. Caz Weaver MD 10/15/2017 11:45 AM

## 2017-10-15 ENCOUNTER — Ambulatory Visit (HOSPITAL_COMMUNITY): Payer: Medicare Other

## 2017-10-15 ENCOUNTER — Other Ambulatory Visit: Payer: Self-pay

## 2017-10-15 ENCOUNTER — Encounter (HOSPITAL_COMMUNITY): Payer: Self-pay | Admitting: Cardiology

## 2017-10-15 DIAGNOSIS — I130 Hypertensive heart and chronic kidney disease with heart failure and stage 1 through stage 4 chronic kidney disease, or unspecified chronic kidney disease: Secondary | ICD-10-CM | POA: Diagnosis not present

## 2017-10-15 LAB — GLUCOSE, CAPILLARY
GLUCOSE-CAPILLARY: 237 mg/dL — AB (ref 65–99)
Glucose-Capillary: 160 mg/dL — ABNORMAL HIGH (ref 65–99)

## 2017-10-15 NOTE — Progress Notes (Signed)
Ambulated patient in hallway at this time after patient has been in bathroom having a small bowel movement. Gait slow and some what unsteady, standby by assisted given. Patient denied any dizziness or nausea with ambulation. No shortness of breath noted with ambulation as well. Assisted patient to chair with chair alarm in place. Patient is talking to his wife of the telephone in his room, patient refuses to eat any food this morning. Will continue to monitor. Call bell is in reach.

## 2017-10-15 NOTE — Progress Notes (Signed)
Patient is eating pudding and crackers at this time, no nausea noted.

## 2017-10-15 NOTE — Discharge Instructions (Signed)
° ° °  Supplemental Discharge Instructions for  Pacemaker/Defibrillator Patients  Activity No heavy lifting or vigorous activity with your left/right arm for 6 to 8 weeks.  Do not raise your left/right arm above your head for one week.  Gradually raise your affected arm as drawn below.              10/18/17                    10/19/17                     10/20/17                   10/21/17 __  NO DRIVING (patient does not drive)  WOUND CARE - Keep the wound area clean and dry.  Do not get this area wet, no showers until cleared to at your wound check visit. - The tape/steri-strips on your wound will fall off; do not pull them off.  No bandage is needed on the site.  DO  NOT apply any creams, oils, or ointments to the wound area. - If you notice any drainage or discharge from the wound, any swelling or bruising at the site, or you develop a fever > 101? F after you are discharged home, call the office at once.  Special Instructions - You are still able to use cellular telephones; use the ear opposite the side where you have your pacemaker/defibrillator.  Avoid carrying your cellular phone near your device. - When traveling through airports, show security personnel your identification card to avoid being screened in the metal detectors.  Ask the security personnel to use the hand wand. - Avoid arc welding equipment, MRI testing (magnetic resonance imaging), TENS units (transcutaneous nerve stimulators).  Call the office for questions about other devices. - Avoid electrical appliances that are in poor condition or are not properly grounded. - Microwave ovens are safe to be near or to operate.  Additional information for defibrillator patients should your device go off: - If your device goes off ONCE and you feel fine afterward, notify the device clinic nurses. - If your device goes off ONCE and you do not feel well afterward, call 911. - If your device goes off TWICE, call 911. - If your device  goes off THREE times in one day, call 911.  DO NOT DRIVE YOURSELF OR A FAMILY MEMBER WITH A DEFIBRILLATOR TO THE HOSPITAL--CALL 911.

## 2017-10-15 NOTE — Progress Notes (Signed)
During BSR patient complained of nausea and "up chucking" while in radiology for x-ray, however during that time, 0705, patient stated his nausea was better and refused any  Medications to assist with nausea. At this time, patient has vomited 200cc of brown emesis, medicated with Zofran per order. Will continue to monitor. Will ambulate patient in hallway this shift as well. No other s/s of distress noted or complaints voiced at this time. BP=120/50 HR=V-paced 82

## 2017-10-17 ENCOUNTER — Telehealth: Payer: Self-pay | Admitting: Cardiology

## 2017-10-17 NOTE — Telephone Encounter (Signed)
I spoke with [facility] staff and informed them that patient should not remove bandage till patient is seen at his wound care check on 10/28/17, informed her I would forward to Dr. Gershon Craneamnitz's nurse to follow up regarding new orders to continue home health. She verbalized understanding and thankful for the call.

## 2017-10-17 NOTE — Telephone Encounter (Signed)
New Message:   [facility] would like to know if they need to continue care on this patient do to receiving a pacemaker. Pt has gained 4 lbs weight gain since yesterday and has swelling in his hands and below the knees. BP 118/56 and a HR of 63. Pt has urination decrease and has only used the bathroom one time today. Glucose for pt was 178. [facility] was only there for wound care but wants to know if they should continue care. Pt wife states someone called her and told them to decrease his fluid pill to 1 tablet on MWFSu. Hospital told wife to follow through with same instructions. Pt was discharged on 3/20. Pt had pacemarker put in on 3/19 and they have several concerns and needs to know if they need to continue home care. Clients will need a new order if wanting them to continue home care for this pt. Client would like to have approval to remove bandages from wounds do to the wounds getting better.

## 2017-10-18 ENCOUNTER — Encounter (HOSPITAL_COMMUNITY): Payer: Self-pay | Admitting: Emergency Medicine

## 2017-10-18 ENCOUNTER — Other Ambulatory Visit: Payer: Self-pay

## 2017-10-18 ENCOUNTER — Telehealth: Payer: Self-pay | Admitting: Cardiology

## 2017-10-18 ENCOUNTER — Emergency Department (HOSPITAL_COMMUNITY)
Admit: 2017-10-18 | Discharge: 2017-10-18 | Disposition: A | Discharge status: Home / Self Care | Payer: Medicare Other | Attending: Emergency Medicine | Admitting: Emergency Medicine

## 2017-10-18 ENCOUNTER — Inpatient Hospital Stay (HOSPITAL_COMMUNITY)
Admission: EM | Admit: 2017-10-18 | Discharge: 2017-10-21 | DRG: 280 | Disposition: A | Discharge status: Hospice | Payer: Medicare Other | Attending: Family Medicine | Admitting: Family Medicine

## 2017-10-18 ENCOUNTER — Emergency Department (HOSPITAL_COMMUNITY): Payer: Medicare Other

## 2017-10-18 DIAGNOSIS — I5033 Acute on chronic diastolic (congestive) heart failure: Secondary | ICD-10-CM | POA: Diagnosis not present

## 2017-10-18 DIAGNOSIS — H353230 Exudative age-related macular degeneration, bilateral, stage unspecified: Secondary | ICD-10-CM | POA: Diagnosis present

## 2017-10-18 DIAGNOSIS — E1022 Type 1 diabetes mellitus with diabetic chronic kidney disease: Secondary | ICD-10-CM | POA: Diagnosis not present

## 2017-10-18 DIAGNOSIS — I130 Hypertensive heart and chronic kidney disease with heart failure and stage 1 through stage 4 chronic kidney disease, or unspecified chronic kidney disease: Principal | ICD-10-CM | POA: Diagnosis present

## 2017-10-18 DIAGNOSIS — E11319 Type 2 diabetes mellitus with unspecified diabetic retinopathy without macular edema: Secondary | ICD-10-CM | POA: Diagnosis present

## 2017-10-18 DIAGNOSIS — R635 Abnormal weight gain: Secondary | ICD-10-CM

## 2017-10-18 DIAGNOSIS — M7989 Other specified soft tissue disorders: Secondary | ICD-10-CM | POA: Diagnosis not present

## 2017-10-18 DIAGNOSIS — I10 Essential (primary) hypertension: Secondary | ICD-10-CM

## 2017-10-18 DIAGNOSIS — Z950 Presence of cardiac pacemaker: Secondary | ICD-10-CM | POA: Diagnosis not present

## 2017-10-18 DIAGNOSIS — N179 Acute kidney failure, unspecified: Secondary | ICD-10-CM | POA: Diagnosis present

## 2017-10-18 DIAGNOSIS — E1122 Type 2 diabetes mellitus with diabetic chronic kidney disease: Secondary | ICD-10-CM | POA: Diagnosis present

## 2017-10-18 DIAGNOSIS — E875 Hyperkalemia: Secondary | ICD-10-CM | POA: Diagnosis present

## 2017-10-18 DIAGNOSIS — Z794 Long term (current) use of insulin: Secondary | ICD-10-CM | POA: Diagnosis not present

## 2017-10-18 DIAGNOSIS — E1142 Type 2 diabetes mellitus with diabetic polyneuropathy: Secondary | ICD-10-CM | POA: Diagnosis present

## 2017-10-18 DIAGNOSIS — I441 Atrioventricular block, second degree: Secondary | ICD-10-CM | POA: Diagnosis present

## 2017-10-18 DIAGNOSIS — R7989 Other specified abnormal findings of blood chemistry: Secondary | ICD-10-CM

## 2017-10-18 DIAGNOSIS — N183 Chronic kidney disease, stage 3 unspecified: Secondary | ICD-10-CM | POA: Diagnosis present

## 2017-10-18 DIAGNOSIS — I5023 Acute on chronic systolic (congestive) heart failure: Secondary | ICD-10-CM | POA: Diagnosis present

## 2017-10-18 DIAGNOSIS — R609 Edema, unspecified: Secondary | ICD-10-CM

## 2017-10-18 DIAGNOSIS — R748 Abnormal levels of other serum enzymes: Secondary | ICD-10-CM | POA: Diagnosis not present

## 2017-10-18 DIAGNOSIS — R531 Weakness: Secondary | ICD-10-CM

## 2017-10-18 DIAGNOSIS — Z7982 Long term (current) use of aspirin: Secondary | ICD-10-CM

## 2017-10-18 DIAGNOSIS — Z515 Encounter for palliative care: Secondary | ICD-10-CM | POA: Diagnosis not present

## 2017-10-18 DIAGNOSIS — Z992 Dependence on renal dialysis: Secondary | ICD-10-CM | POA: Diagnosis not present

## 2017-10-18 DIAGNOSIS — Z66 Do not resuscitate: Secondary | ICD-10-CM | POA: Diagnosis present

## 2017-10-18 DIAGNOSIS — R778 Other specified abnormalities of plasma proteins: Secondary | ICD-10-CM | POA: Diagnosis present

## 2017-10-18 DIAGNOSIS — E871 Hypo-osmolality and hyponatremia: Secondary | ICD-10-CM | POA: Diagnosis present

## 2017-10-18 DIAGNOSIS — I214 Non-ST elevation (NSTEMI) myocardial infarction: Secondary | ICD-10-CM | POA: Diagnosis present

## 2017-10-18 DIAGNOSIS — I509 Heart failure, unspecified: Secondary | ICD-10-CM

## 2017-10-18 DIAGNOSIS — I2510 Atherosclerotic heart disease of native coronary artery without angina pectoris: Secondary | ICD-10-CM | POA: Diagnosis present

## 2017-10-18 DIAGNOSIS — K219 Gastro-esophageal reflux disease without esophagitis: Secondary | ICD-10-CM | POA: Diagnosis present

## 2017-10-18 DIAGNOSIS — N186 End stage renal disease: Secondary | ICD-10-CM | POA: Diagnosis not present

## 2017-10-18 DIAGNOSIS — E119 Type 2 diabetes mellitus without complications: Secondary | ICD-10-CM

## 2017-10-18 DIAGNOSIS — Z09 Encounter for follow-up examination after completed treatment for conditions other than malignant neoplasm: Secondary | ICD-10-CM

## 2017-10-18 DIAGNOSIS — I340 Nonrheumatic mitral (valve) insufficiency: Secondary | ICD-10-CM | POA: Diagnosis not present

## 2017-10-18 LAB — BASIC METABOLIC PANEL
ANION GAP: 12 (ref 5–15)
Anion gap: 10 (ref 5–15)
BUN: 102 mg/dL — ABNORMAL HIGH (ref 6–20)
BUN: 100 mg/dL — AB (ref 6–20)
CALCIUM: 8.1 mg/dL — AB (ref 8.9–10.3)
CO2: 18 mmol/L — ABNORMAL LOW (ref 22–32)
CO2: 20 mmol/L — ABNORMAL LOW (ref 22–32)
CREATININE: 4.75 mg/dL — AB (ref 0.61–1.24)
Calcium: 8.1 mg/dL — ABNORMAL LOW (ref 8.9–10.3)
Chloride: 98 mmol/L — ABNORMAL LOW (ref 101–111)
Chloride: 98 mmol/L — ABNORMAL LOW (ref 101–111)
Creatinine, Ser: 4.85 mg/dL — ABNORMAL HIGH (ref 0.61–1.24)
GFR calc Af Amer: 11 mL/min — ABNORMAL LOW (ref >60)
GFR calc non Af Amer: 10 mL/min — ABNORMAL LOW (ref >60)
GFR, EST AFRICAN AMERICAN: 11 mL/min — AB (ref >60)
GFR, EST NON AFRICAN AMERICAN: 9 mL/min — AB (ref >60)
GLUCOSE: 151 mg/dL — AB (ref 65–99)
Glucose, Bld: 120 mg/dL — ABNORMAL HIGH (ref 65–99)
POTASSIUM: 5.3 mmol/L — AB (ref 3.5–5.1)
POTASSIUM: 5.2 mmol/L — AB (ref 3.5–5.1)
SODIUM: 128 mmol/L — AB (ref 135–145)
Sodium: 128 mmol/L — ABNORMAL LOW (ref 135–145)

## 2017-10-18 LAB — GLUCOSE, CAPILLARY
GLUCOSE-CAPILLARY: 150 mg/dL — AB (ref 65–99)
Glucose-Capillary: 195 mg/dL — ABNORMAL HIGH (ref 65–99)
Glucose-Capillary: 159 mg/dL — ABNORMAL HIGH (ref 65–99)

## 2017-10-18 LAB — CBC WITH DIFFERENTIAL/PLATELET
BASOS ABS: 0 10*3/uL (ref 0.0–0.1)
Basophils Relative: 0 %
Eosinophils Absolute: 0.1 10*3/uL (ref 0.0–0.7)
Eosinophils Relative: 2 %
HEMATOCRIT: 39.2 % (ref 39.0–52.0)
Hemoglobin: 13.2 g/dL (ref 13.0–17.0)
LYMPHS PCT: 23 %
Lymphs Abs: 1.3 10*3/uL (ref 0.7–4.0)
MCH: 34.2 pg — ABNORMAL HIGH (ref 26.0–34.0)
MCHC: 33.7 g/dL (ref 30.0–36.0)
MCV: 101.6 fL — ABNORMAL HIGH (ref 78.0–100.0)
MONO ABS: 0.6 10*3/uL (ref 0.1–1.0)
Monocytes Relative: 11 %
NEUTROS ABS: 3.7 10*3/uL (ref 1.7–7.7)
Neutrophils Relative %: 64 %
Platelets: 106 10*3/uL — ABNORMAL LOW (ref 150–400)
RBC: 3.86 MIL/uL — AB (ref 4.22–5.81)
RDW: 13.9 % (ref 11.5–15.5)
WBC: 5.7 10*3/uL (ref 4.0–10.5)

## 2017-10-18 LAB — URINALYSIS, ROUTINE W REFLEX MICROSCOPIC
Bilirubin Urine: NEGATIVE
Glucose, UA: NEGATIVE mg/dL
HGB URINE DIPSTICK: NEGATIVE
Ketones, ur: NEGATIVE mg/dL
LEUKOCYTES UA: NEGATIVE
NITRITE: NEGATIVE
Protein, ur: NEGATIVE mg/dL
Specific Gravity, Urine: 1.011 (ref 1.005–1.030)
pH: 5 (ref 5.0–8.0)

## 2017-10-18 LAB — TROPONIN I
TROPONIN I: 27.01 ng/mL — AB (ref <0.03)
Troponin I: 24.67 ng/mL (ref <0.03)
Troponin I: 19.64 ng/mL (ref <0.03)

## 2017-10-18 LAB — SODIUM, URINE, RANDOM: SODIUM UR: 17 mmol/L

## 2017-10-18 LAB — HEPARIN LEVEL (UNFRACTIONATED): Heparin Unfractionated: 0.47 IU/mL (ref 0.30–0.70)

## 2017-10-18 LAB — HEMOGLOBIN A1C
Hgb A1c MFr Bld: 6.2 % — ABNORMAL HIGH (ref 4.8–5.6)
Mean Plasma Glucose: 131.24 mg/dL

## 2017-10-18 LAB — OSMOLALITY: OSMOLALITY: 320 mosm/kg — AB (ref 275–295)

## 2017-10-18 MED ORDER — SODIUM CHLORIDE 0.9% FLUSH
3.0000 mL | Freq: Two times a day (BID) | INTRAVENOUS | Status: DC
  Administered 2017-10-18 – 2017-10-19 (×2): 3 mL via INTRAVENOUS

## 2017-10-18 MED ORDER — ACETAMINOPHEN 325 MG PO TABS
650.0000 mg | ORAL_TABLET | Freq: Four times a day (QID) | ORAL | Status: DC | PRN
  Administered 2017-10-18 – 2017-10-20 (×3): 650 mg via ORAL
  Filled 2017-10-18 (×3): qty 2

## 2017-10-18 MED ORDER — INSULIN GLARGINE 100 UNIT/ML ~~LOC~~ SOLN
10.0000 [IU] | Freq: Every day | SUBCUTANEOUS | Status: DC
  Administered 2017-10-18 – 2017-10-20 (×3): 10 [IU] via SUBCUTANEOUS
  Filled 2017-10-18 (×4): qty 0.1

## 2017-10-18 MED ORDER — HEPARIN (PORCINE) IN NACL 100-0.45 UNIT/ML-% IJ SOLN
1300.0000 [IU]/h | INTRAMUSCULAR | Status: DC
  Administered 2017-10-18 – 2017-10-20 (×4): 1300 [IU]/h via INTRAVENOUS
  Filled 2017-10-18 (×4): qty 250

## 2017-10-18 MED ORDER — LISINOPRIL 2.5 MG PO TABS: 5.0000 mg | ORAL_TABLET | Freq: Every day | ORAL | Status: DC

## 2017-10-18 MED ORDER — SODIUM CHLORIDE 0.9 % IV SOLN
250.0000 mL | INTRAVENOUS | Status: DC | PRN
Start: 2017-10-18 — End: 2017-10-21

## 2017-10-18 MED ORDER — ACETAMINOPHEN 325 MG PO TABS: 650.0000 mg | ORAL_TABLET | ORAL | Status: DC | PRN

## 2017-10-18 MED ORDER — INSULIN ASPART 100 UNIT/ML ~~LOC~~ SOLN: 0.0000 [IU] | Freq: Every day | SUBCUTANEOUS | Status: DC

## 2017-10-18 MED ORDER — ASPIRIN 81 MG PO CHEW
324.0000 mg | CHEWABLE_TABLET | Freq: Once | ORAL | Status: AC
  Administered 2017-10-18: 324 mg via ORAL
  Filled 2017-10-18: qty 4

## 2017-10-18 MED ORDER — ONDANSETRON HCL 4 MG/2ML IJ SOLN
4.0000 mg | Freq: Four times a day (QID) | INTRAMUSCULAR | Status: DC | PRN
  Administered 2017-10-19 – 2017-10-20 (×2): 4 mg via INTRAVENOUS
  Filled 2017-10-18 (×2): qty 2

## 2017-10-18 MED ORDER — INSULIN ASPART 100 UNIT/ML ~~LOC~~ SOLN
0.0000 [IU] | Freq: Three times a day (TID) | SUBCUTANEOUS | Status: DC
  Administered 2017-10-18: 2 [IU] via SUBCUTANEOUS
  Administered 2017-10-19: 1 [IU] via SUBCUTANEOUS
  Administered 2017-10-19 (×2): 2 [IU] via SUBCUTANEOUS
  Administered 2017-10-20: 1 [IU] via SUBCUTANEOUS

## 2017-10-18 MED ORDER — HEPARIN BOLUS VIA INFUSION
4000.0000 [IU] | Freq: Once | INTRAVENOUS | Status: AC
  Administered 2017-10-18: 4000 [IU] via INTRAVENOUS
  Filled 2017-10-18: qty 4000

## 2017-10-18 MED ORDER — SODIUM CHLORIDE 0.9% FLUSH: 3.0000 mL | INTRAVENOUS | Status: DC | PRN

## 2017-10-18 MED ORDER — TRAZODONE HCL 50 MG PO TABS
50.0000 mg | ORAL_TABLET | Freq: Every day | ORAL | Status: DC
  Administered 2017-10-18 – 2017-10-20 (×3): 50 mg via ORAL
  Filled 2017-10-18 (×3): qty 1

## 2017-10-18 MED ORDER — ASPIRIN EC 81 MG PO TBEC
81.0000 mg | DELAYED_RELEASE_TABLET | Freq: Every day | ORAL | Status: DC
  Administered 2017-10-19 – 2017-10-21 (×3): 81 mg via ORAL
  Filled 2017-10-18 (×3): qty 1

## 2017-10-18 MED ORDER — ASPIRIN EC 81 MG PO TBEC: 81.0000 mg | DELAYED_RELEASE_TABLET | Freq: Every day | ORAL | Status: DC

## 2017-10-18 NOTE — Consult Note (Signed)
Renal Service Consult Note [facility]  Ronald Ronald Lindsey 10/18/2017 Ronald Lindsey Requesting Physician:  Dr [name]  Reason for Consult:  Acute on CRF HPI: The patient is a 84 y.o. year-old with hx of CKD IV, HTN, diast CHF, DM on insulin who presented to ED this morning with c/o wt gain and ankle swelling.  Labs showed BUN  100 and creat 4.85, they were last checked on 3/19 at BUN 58 and creat 2.27.  Marland Kitchen.  Asked to see for acute on CRF.    Trop here was 27 , repeat was 24.6.  CXR showed mild - mod CHF.  No CP.  Seen by cardiology , and started on IV heparin. EKG was nondiagnostic w/ a paced rhythm.  ECHO ordered to check for WMA.    Pt denies sig SOB, endorses chronic ankle edema, no chest pain or orthopnea.  Takes acei for HTN.  Took lasix but was changed to demadex 1-2 mos ok by cardiology.  DOes not have a kidney doctor.  He had admission 2/16 - 2/26 for CHD exacerbation rx'd with diuresis and dc'd on demadex 60 bid.  He was here 3/19 overnight for PPM placement for bradycardia.    Pt denies any nsaid use.  Has not had recent contrast.  He is on ACEi at home.   ROS  denies CP  no joint pain   no HA  no blurry vision  no rash  no diarrhea  no nausea/ vomiting  no dysuria  no difficulty voiding  no change in urine color    Past Medical History  Past Medical History:  Diagnosis Date  . Acute diastolic CHF (congestive heart failure), NYHA class 3 (HCC) 09/13/2017  . Age-related macular degeneration, wet, both eyes (HCC)   . Arthritis    "lower back; years ago" (10/14/2017)  . Cellulitis of right leg 11/06/2011  . CHF (congestive heart failure) (HCC) 09/13/2017  . Diabetic neuropathy (HCC)   . Diabetic retinopathy of both eyes (HCC)   . Gastroenteritis 11/06/2011  . GERD (gastroesophageal reflux disease)   . HTN (hypertension)   . Mobitz (type) I (Wenckebach's) atrioventricular block 09/16/2017  . Pleural effusion 05/08/2015  . Presence of permanent cardiac pacemaker  10/14/2017  . Stage III chronic kidney disease (HCC)   . Type II diabetes mellitus (HCC)    Past Surgical History  Past Surgical History:  Procedure Laterality Date  . APPENDECTOMY    . BACK SURGERY    . BRAIN SURGERY    . 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 COLONOSCOPY  11/08/2011   Procedure: COLONOSCOPY;  Surgeon: Graylin ShiverSalem F Ganem, MD;  Location: [facility] END[facility];  Service: Endoscopy;  Laterality: N/A;  with decompression  . INSERT / REPLACE / REMOVE PACEMAKER  10/14/2017  . LUMBAR DISC SURGERY     "cleaned up arthritis"  . PACEMAKER IMPLANT N/A 10/14/2017   Procedure: PACEMAKER IMPLANT;  Surgeon: Regan Lemmingamnitz, Will Martin, MD;  Location: [facility] INVASIVE CV [facility];  Service: Cardiovascular;  Laterality: N/A;   Family History  Family History  Problem Relation Age of Onset  . Cancer Mother   . Heart disease Father   . Obesity Sister   . CAD Neg Hx    Social History  reports that he has never smoked. He has never used smokeless tobacco. He reports that he does not drink alcohol or use drugs. Allergies No Known  Allergies Home medications Prior to Admission medications   Medication Sig Start Date End Date Taking? Authorizing Provider  acetaminophen (TYLENOL) 325 MG tablet Take 2 tablets (650 mg total) by mouth every 6 (six) hours as needed for mild pain or headache. 09/23/17  Yes Albertine Grates, MD  aspirin EC 81 MG tablet Take 81 mg by mouth daily.   Yes [provider]  hydrocerin (EUCERIN) CREA Apply 1 application topically 2 (two) times a week. 09/23/17  Yes Albertine Grates, MD  insulin aspart (NOVOLOG) 100 UNIT/ML injection Insulin sliding scale: Blood sugar  120-150   3units                       151-200   4units                       201-250   7units                       251- 300  11units                       301-350   15uints                       351-400   20units                       >400         call MD  immediately 09/23/17  Yes Albertine Grates, MD  LANTUS SOLOSTAR 100 UNIT/ML Solostar Pen Inject 15 Units into the skin daily at 10 pm. 09/23/17  Yes Albertine Grates, MD  lisinopril (PRINIVIL,ZESTRIL) 5 MG tablet Take 1 tablet (5 mg total) by mouth daily. 09/24/17  Yes Albertine Grates, MD  Multiple Vitamins-Minerals (PRESERVISION AREDS 2 PO) Take 1 tablet by mouth 2 (two) times daily.   Yes [provider]  torsemide (DEMADEX) 20 MG tablet Take 1 tablet (20 mg total) by mouth every other day. 10/13/17  Yes Rosalio Macadamia, NP  traZODone (DESYREL) 50 MG tablet Take 1 tablet (50 mg total) by mouth at bedtime as needed for sleep. Patient taking differently: Take 50 mg by mouth at bedtime.  09/23/17  Yes Albertine Grates, MD   Liver Function Tests No results for input(s): AST, ALT, ALKPHOS, BILITOT, PROT, ALBUMIN in the last 168 hours. No results for input(s): LIPASE, AMYLASE in the last 168 hours. CBC Recent Labs  Lab 10/18/17 1042  WBC 5.7  NEUTROABS 3.7  HGB 13.2  HCT 39.2  MCV 101.6*  PLT 106*   Basic Metabolic Panel Recent Labs  Lab 10/14/17 1239 10/18/17 1042 10/18/17 1351  NA 137 128* 128*  K 5.4* 5.3* 5.2*  CL 106 98* 98*  CO2 23 18* 20*  GLUCOSE 176* 120* 151*  BUN 58* 102* 100*  CREATININE 2.27* 4.85* 4.75*  CALCIUM 9.1 8.1* 8.1*   Iron/TIBC/Ferritin/ %Sat No results found for: IRON, TIBC, FERRITIN, IRONPCTSAT  Vitals:   10/18/17 1300 10/18/17 1345 10/18/17 1400 10/18/17 1513  BP: (!) 126/58 (!) 117/55 (!) 110/52 (!) 112/58  Pulse: 62 (!) 59 (!) 59 65  Resp: 18 18 20 20   Temp:    97.7 F (36.5 C)  TempSrc:    Oral  SpO2: 100% 100% 100% 100%  Weight:    109.1 kg (240 lb 8 oz)  Height:    5\' 10"  (1.778  m)   Exam Gen elderly WM, WN WD No rash, cyanosis or gangrene Sclera anicteric, throat clear  N+ mild-mod jvd Chest soft rales bilat bases 1/3 up RRR no MRG Abd soft ntnd no mass or ascites +bs GU normal male MS no joint effusions or deformity Ext 1+ bilat L edema, no wounds or  ulcers Neuro is alert, Ox 3 , nf   Home meds: - lisinopril 5 mg qd/ demadex 20 mg every other day - novolog SSI/ lantus 15 - ecasa 81/ desyrel/ MVI  CXR- persistent mild pulmonary edema and bibasilar effusions/atelectasis greater on LEFT CT abd 2016 > bilat renal atrophy and cysts ECHO 2/19 > LVEF 65% UA (2013) > 30 protein, 0-2 rbc    Impression: 1  AKI on CKD IV - baseline creat is 1.8- 2.4.  Up to 4.8 today.  Vol status not clear, CXR +CHF but not vol overloaded on exam, pt is asymptomatic and troponin is very high, ?ischemia related pulm edema. Will hold off on diuretics for now. Avoid acei/ arb's/ nsaids.   2  +trop - 27 > 24, per cardiology 3  DM on insulin 4  HTN - hold acei for now, BP's are not high 5  SP PPM    Plan - will follow  Vinson Moselle MD [facility] pager [phone]   10/18/2017, 4:49 PM

## 2017-10-18 NOTE — Progress Notes (Signed)
ANTICOAGULATION CONSULT NOTE - Initial Consult  Pharmacy Consult:  Heparin Indication: chest pain/ACS  No Known Allergies  Patient Measurements: Weight: 237 lb (107.5 kg) Heparin Dosing Weight: 96 kg  Vital Signs: Temp: 97.5 F (36.4 C) (03/23 0933) BP: 126/58 (03/23 1300) Pulse Rate: 62 (03/23 1300)  Labs: Recent Labs    10/18/17 1042  HGB 13.2  HCT 39.2  PLT 106*  CREATININE 4.85*  TROPONINI 27.01*    Estimated Creatinine Clearance: 12.4 mL/min (A) (by C-G formula based on SCr of 4.85 mg/dL (H)).   Medical History: Past Medical History:  Diagnosis Date  . Acute diastolic CHF (congestive heart failure), NYHA class 3 (HCC) 09/13/2017  . Age-related macular degeneration, wet, both eyes (HCC)   . Arthritis    "lower back; years ago" (10/14/2017)  . Cellulitis of right leg 11/06/2011  . CHF (congestive heart failure) (HCC) 09/13/2017  . Diabetic neuropathy (HCC)   . Diabetic retinopathy of both eyes (HCC)   . Gastroenteritis 11/06/2011  . GERD (gastroesophageal reflux disease)   . HTN (hypertension)   . Mobitz (type) I (Wenckebach's) atrioventricular block 09/16/2017  . Pleural effusion 05/08/2015  . Presence of permanent cardiac pacemaker 10/14/2017  . Stage III chronic kidney disease (HCC)   . Type II diabetes mellitus (HCC)       Assessment: [name] presented with left arm/hand swelling.  Pharmacy consulted to initiate IV heparin for ACS.  Troponin 27.  Patient has AKI and thrombocytopenia.  No bleeding reported.   Goal of Therapy:  Heparin level 0.3-0.7 units/ml Monitor platelets by anticoagulation protocol: Yes    Plan:  Heparin 4000 units IV bolus x 1, then Heparin gtt at 1300 units/hr Check 8 hr heparin level Daily heparin level and CBC   Travoris Bushey D. Laney Potashang, PharmD, BCPS Pager:  [phone] 10/18/2017, 1:20 PM

## 2017-10-18 NOTE — Consult Note (Signed)
Cardiology Consultation:   Patient ID: Ronald Lindsey; [id number]; 07-12-1925   Admit date: 10/18/2017 Date of Consult: 10/18/2017  Primary Care Provider: Renford Dills, MD Primary Cardiologist: Charlton Haws, MD  Primary Electrophysiologist:  Elberta Fortis   Patient Profile:   Ronald Lindsey is a 84 y.o. male with a hx of second-degree Mobitz type I heart block with recent pacemaker implantation who is being seen today for the evaluation of edema and abnormal cardiac enzymes at the request of Dr. Charm Barges.  History of Present Illness:   Mr. Zirbes had a dual-chamber permanent pacemaker implanted for bradycardia due to Mobitz type I second-degree atrioventricular block on March 19.  The procedure was uncomplicated and intravenous contrast was not utilized.  He is chronically on a high dose of diuretics and was switched from furosemide to torsemide at the time of discharge from the hospital.  Following hospital discharge, his family called back with complaints of left upper extremity swelling and generalized edema.  While I was examining the patient, there is obviously a big discrepancy between what they consider severe swelling and what I find to be the objective abnormalities.  A left upper extremity ultrasound has already been performed and there is no evidence of venous thrombosis.    There is also a discrepancy between the symptoms that the patient reports and the ones that his wife endorses. When I asked him about dyspnea, he denies any shortness of breath, but she reports that he has noticed that he is breathing with difficulty.  He states that he slept well last night, but she states that he asked for extra pillow (3 rather than his usual 2 pillows).  He reports that his appetite is good, but she insists that he has not eaten anything in the last 2 days.  The patient vehemently denies any chest discomfort in the last several days.  His laboratory tests show several severe and new abnormalities.   His sodium is 128, but was normal at 137 on March 19.  He is mildly hyperkalemic with a potassium of 5.3 (this is a chronic tendency).  His creatinine has increased to 4.85 from a baseline of roughly 2.0-2.5.  Most impressively, he has a troponin of 27.  His ECG is nondiagnostic with 100% atrial sensed ventricular paced rhythm.    Past Medical History:  Diagnosis Date  . Acute diastolic CHF (congestive heart failure), NYHA class 3 (HCC) 09/13/2017  . Age-related macular degeneration, wet, both eyes (HCC)   . Arthritis    "lower back; years ago" (10/14/2017)  . Cellulitis of right leg 11/06/2011  . CHF (congestive heart failure) (HCC) 09/13/2017  . Diabetic neuropathy (HCC)   . Diabetic retinopathy of both eyes (HCC)   . Gastroenteritis 11/06/2011  . GERD (gastroesophageal reflux disease)   . HTN (hypertension)   . Mobitz (type) I (Wenckebach's) atrioventricular block 09/16/2017  . Pleural effusion 05/08/2015  . Presence of permanent cardiac pacemaker 10/14/2017  . Stage III chronic kidney disease (HCC)   . Type II diabetes mellitus (HCC)     Past Surgical History:  Procedure Laterality Date  . APPENDECTOMY    . BACK SURGERY    . BRAIN SURGERY    . 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 COLONOSCOPY  11/08/2011   Procedure: COLONOSCOPY;  Surgeon: Graylin Shiver, MD;  Location: [facility];  Service: Endoscopy;  Laterality: N/A;  with decompression  .  INSERT / REPLACE / REMOVE PACEMAKER  10/14/2017  . LUMBAR DISC SURGERY     "cleaned up arthritis"  . PACEMAKER IMPLANT N/A 10/14/2017   Procedure: PACEMAKER IMPLANT;  Surgeon: Regan Lemming, MD;  Location: [facility];  Service: Cardiovascular;  Laterality: N/A;     Home Medications:  Prior to Admission medications   Medication Sig Start Date End Date Taking? Authorizing Provider  acetaminophen (TYLENOL) 325 MG tablet Take 2 tablets (650 mg  total) by mouth every 6 (six) hours as needed for mild pain or headache. 09/23/17  Yes Albertine Grates, MD  aspirin EC 81 MG tablet Take 81 mg by mouth daily.   Yes [provider]  hydrocerin (EUCERIN) CREA Apply 1 application topically 2 (two) times a week. 09/23/17  Yes Albertine Grates, MD  insulin aspart (NOVOLOG) 100 UNIT/ML injection Insulin sliding scale: Blood sugar  120-150   3units                       151-200   4units                       201-250   7units                       251- 300  11units                       301-350   15uints                       351-400   20units                       >400         call MD immediately 09/23/17  Yes Albertine Grates, MD  LANTUS SOLOSTAR 100 UNIT/ML Solostar Pen Inject 15 Units into the skin daily at 10 pm. 09/23/17  Yes Albertine Grates, MD  lisinopril (PRINIVIL,ZESTRIL) 5 MG tablet Take 1 tablet (5 mg total) by mouth daily. 09/24/17  Yes Albertine Grates, MD  Multiple Vitamins-Minerals (PRESERVISION AREDS 2 PO) Take 1 tablet by mouth 2 (two) times daily.   Yes [provider]  torsemide (DEMADEX) 20 MG tablet Take 1 tablet (20 mg total) by mouth every other day. 10/13/17  Yes Rosalio Macadamia, NP  traZODone (DESYREL) 50 MG tablet Take 1 tablet (50 mg total) by mouth at bedtime as needed for sleep. Patient taking differently: Take 50 mg by mouth at bedtime.  09/23/17  Yes Albertine Grates, MD    Inpatient Medications: Scheduled Meds:  Continuous Infusions:  PRN Meds:   Allergies:   No Known Allergies  Social History:   Social History   Socioeconomic History  . Marital status: Married    Spouse name: Not on file  . Number of children: Not on file  . Years of education: Not on file  . Highest education level: Not on file  Occupational History  . Occupation: Retired  Engineer, production  . Financial resource strain: Not on file  . Food insecurity:    Worry: Not on file    Inability: Not on file  . Transportation needs:    Medical: Not on file    Non-medical:  Not on file  Tobacco Use  . Smoking status: Never Smoker  . Smokeless tobacco: Never Used  Substance and Sexual Activity  .  Alcohol use: No  . Drug use: No  . Sexual activity: Never  Lifestyle  . Physical activity:    Days per week: Not on file    Minutes per session: Not on file  . Stress: Not on file  Relationships  . Social connections:    Talks on phone: Not on file    Gets together: Not on file    Attends religious service: Not on file    Active member of club or organization: Not on file    Attends meetings of clubs or organizations: Not on file    Relationship status: Not on file  . Intimate partner violence:    Fear of current or ex partner: Not on file    Emotionally abused: Not on file    Physically abused: Not on file    Forced sexual activity: Not on file  Other Topics Concern  . Not on file  Social History Narrative   [place] in [place], [place]    Family History:    Family History  Problem Relation Age of Onset  . Cancer Mother   . Heart disease Father   . Obesity Sister   . CAD Neg Hx      ROS:  Please see the history of present illness.   All other ROS reviewed and negative.     Physical Exam/Data:   Vitals:   10/18/17 1115 10/18/17 1130 10/18/17 1145 10/18/17 1230  BP: (!) 118/53 (!) 108/47 (!) 118/50 (!) 117/53  Pulse: 60 61 62 63  Resp: 20 17 19 20   Temp:      SpO2: 98% 100% 100% 100%  Weight:       No intake or output data in the 24 hours ending 10/18/17 1247 Filed Weights   10/18/17 0940  Weight: 237 lb (107.5 kg)   Body mass index is 34.01 kg/m.  General:  Well nourished, well developed, in no acute distress, obese HEENT: normal Lymph: no adenopathy Neck: 6-7 cm JVD Endocrine:  No thryomegaly Vascular: No carotid bruits; FA pulses 2+ bilaterally without bruits  Cardiac:  normal S1, paradoxically split S2; RRR; no murmur  Lungs:  clear to auscultation bilaterally, no wheezing, rhonchi or rales  Abd: soft, nontender, no  hepatomegaly  Ext: symmetrical 1+ pedal edema Musculoskeletal:  No deformities, BUE and BLE strength normal and equal Skin: warm and dry  Neuro:  CNs 2-12 intact except near blindness, no focal abnormalities noted Psych:  Normal affect   EKG:  The EKG was personally reviewed and demonstrates:  A sensed V paced rhythm Telemetry:  Telemetry was personally reviewed and demonstrates:  A sensed V paced  Relevant CV Studies: ECHO 09/14/2017: Study Conclusions  - Left ventricle: The cavity size was normal. Wall thickness was   increased in a pattern of mild LVH. Systolic function was   vigorous. The estimated ejection fraction was in the range of 65%   to 70%. - Mitral valve: There was mild regurgitation.  ------------------------------------------------------------------- 10/14/2017 CONCLUSIONS:   1. Successful implantation of a [employer] MRI dual-chamber pacemaker for symptomatic bradycardia  2. No early apparent complications.   Laboratory Data:  Chemistry Recent Labs  Lab 10/14/17 1239 10/18/17 1042  NA 137 128*  K 5.4* 5.3*  CL 106 98*  CO2 23 18*  GLUCOSE 176* 120*  BUN 58* 102*  CREATININE 2.27* 4.85*  CALCIUM 9.1 8.1*  GFRNONAA 24* 9*  GFRAA 28* 11*  ANIONGAP 8 12  No results for input(s): PROT, ALBUMIN, AST, ALT, ALKPHOS, BILITOT in the last 168 hours. Hematology Recent Labs  Lab 10/18/17 1042  WBC 5.7  RBC 3.86*  HGB 13.2  HCT 39.2  MCV 101.6*  MCH 34.2*  MCHC 33.7  RDW 13.9  PLT 106*   Cardiac Enzymes Recent Labs  Lab 10/18/17 1042  TROPONINI 27.01*   No results for input(s): TROPIPOC in the last 168 hours.  BNPNo results for input(s): BNP, PROBNP in the last 168 hours.  DDimer No results for input(s): DDIMER in the last 168 hours.  Radiology/Studies:  Dg Chest 2 View  Result Date: 10/18/2017 CLINICAL DATA:  Fluid retention, recent pacemaker placement 4 days ago EXAM: CHEST - 2 VIEW COMPARISON:  10/15/2017 FINDINGS: LEFT  subclavian transvenous sequential pacemaker with leads projecting at RIGHT atrium and RIGHT ventricle unchanged. Enlargement of cardiac silhouette. Mediastinal contours stable with atherosclerotic calcification of aorta noted. Scattered interstitial infiltrates at mid to lower lungs with associated basilar pleural effusions and atelectasis, greater on LEFT, question CHF. Upper lungs clear. No pneumothorax. Bones demineralized. IMPRESSION: Persistent mild pulmonary edema and bibasilar effusions/atelectasis greater on LEFT. Electronically Signed   By: Ulyses Southward M.D.   On: 10/18/2017 11:59   Dg Chest 2 View  Result Date: 10/15/2017 CLINICAL DATA:  Pacemaker placement EXAM: CHEST - 2 VIEW COMPARISON:  09/13/2017 FINDINGS: Interval placement of dual lead pacemaker with leads in the right atrium and right ventricle in good position. No pneumothorax. Pulmonary vascular congestion with interval improvement. Improvement in pulmonary edema since the prior study. Bilateral pleural effusions left greater than right unchanged. Progression of left lower lobe consolidation, likely atelectasis. Mild right lower lobe atelectasis also noted. IMPRESSION: Satisfactory transvenous pacemaker placement Improvement in congestive heart failure and edema since the prior study. Small bilateral effusions left greater than right Progressive left lower lobe consolidation most likely atelectasis. Electronically Signed   By: Marlan Palau M.D.   On: 10/15/2017 09:08    Assessment and Plan:   1. CHF: he has clinical evidence of mild volume overload and CXR shows left heart failure changes, but renal function is substantially worse. He is not in any respiratory distress so will hold off diuretics until we clarify the volume status. 2. Abnormal troponin: surprising finding - he claims to be asymptomatic, ECG is nondiagnostic. Note coronary atherosclerosis in LAD and LCX on chest CT 2016. Repeat troponin and check echo. With troponin of  27, he should have a noticeable new wall motion abnormality. Will start heparin. He is on ASA. No plan for invasive evaluation in asymptomatic 84 year old with acute renal failure, 3. Hyponatremia: likely related to acute on chronic renal failure. 4. Ac on CKD: no contrast used during recent hospitalization, no NSAID use, does not appear to be hypovolemic, on the contrary. Stop ACE inhibitor. Recommend Nephrology consultation. 5. 2nd deg AV block/recent PM: appears to have normal PM function. Echo for pericardial effusion. 6. DM: increased risk of nephrotoxicity   For questions or updates, please contact [facility] Please consult [email or web address] for contact info under Cardiology/STEMI.   Signed, Thurmon Fair, MD  10/18/2017 12:47 PM

## 2017-10-18 NOTE — ED Provider Notes (Signed)
[facility] EMERGENCY DEPARTMENT Provider Note   CSN: [id number] Arrival date & time: 10/18/17  4098     History   Chief Complaint Chief Complaint  Patient presents with  . Weight Gain    HPI Ronald Lindsey is a 84 y.o. male.  HPI  Patient is a 84 year old male with a history of acute diastolic CHF, recent pacemaker placement for Mobitz type I, type 2 diabetes mellitus, diabetic retinopathy, and CKD stage he was running for weight gain of 7 pounds in 3 days.  Patient reports that he was told to weigh himself every day after his pacemaker was placed on 10-14-2017, and found that he gained 7 pounds.  He called the office today, and also reported that he was having some left arm swelling, and was instructed to come the emergency department for evaluation.  Patient denies any chest pain, shortness of breath, orthopnea, dyspnea with exertion, abdominal pain or swelling, or increased lower extremity edema over baseline.  Patient also notes that he has had a decrease in his urine output.  He does note that his left arm has become slightly more edematous.  Patient presents from home, and is cared for by home health nurse as well as his wife.  Of note, patient recently switched from furosemide twice daily to torsemide once daily.  Past Medical History:  Diagnosis Date  . Acute diastolic CHF (congestive heart failure), NYHA class 3 (HCC) 09/13/2017  . Age-related macular degeneration, wet, both eyes (HCC)   . Arthritis    "lower back; years ago" (10/14/2017)  . Cellulitis of right leg 11/06/2011  . CHF (congestive heart failure) (HCC) 09/13/2017  . Diabetic neuropathy (HCC)   . Diabetic retinopathy of both eyes (HCC)   . Gastroenteritis 11/06/2011  . GERD (gastroesophageal reflux disease)   . HTN (hypertension)   . Mobitz (type) I (Wenckebach's) atrioventricular block 09/16/2017  . Pleural effusion 05/08/2015  . Presence of permanent cardiac pacemaker 10/14/2017  . Stage III  chronic kidney disease (HCC)   . Type II diabetes mellitus [facility])     Patient Active Problem List   Diagnosis Date Noted  . Mobitz (type) I (Wenckebach's) atrioventricular block 09/16/2017  . Acute diastolic CHF (congestive heart failure), NYHA class 3 (HCC) 09/13/2017  . CHF (congestive heart failure) (HCC) 09/13/2017  . Pleural effusion 05/08/2015  . Gastroenteritis 11/06/2011  . Renal failure 11/06/2011  . DM (diabetes mellitus) (HCC) 11/06/2011  . Previous back surgery 11/06/2011  . S/P appendectomy 11/06/2011  . Cellulitis of right leg 11/06/2011  . Diabetic retinopathy   . Macular degeneration   . Diabetic neuropathy (HCC)   . HTN (hypertension)   . GERD (gastroesophageal reflux disease)   . Back pain     Past Surgical History:  Procedure Laterality Date  . APPENDECTOMY    . BACK SURGERY    . BRAIN SURGERY    . 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 COLONOSCOPY  11/08/2011   Procedure: COLONOSCOPY;  Surgeon: Graylin Shiver, MD;  Location: [facility] ENDOSCOPY;  Service: Endoscopy;  Laterality: N/A;  with decompression  . INSERT / REPLACE / REMOVE PACEMAKER  10/14/2017  . LUMBAR DISC SURGERY     "cleaned up arthritis"  . PACEMAKER IMPLANT N/A 10/14/2017   Procedure: PACEMAKER IMPLANT;  Surgeon: Regan Lemming, MD;  Location: [facility] INVASIVE CV LAB;  Service: Cardiovascular;  Laterality: N/A;  Home Medications    Prior to Admission medications   Medication Sig Start Date End Date Taking? Authorizing Provider  acetaminophen (TYLENOL) 325 MG tablet Take 2 tablets (650 mg total) by mouth every 6 (six) hours as needed for mild pain or headache. Patient not taking: Reported on 10/06/2017 09/23/17   Albertine GratesXu, Fang, MD  aspirin EC 81 MG tablet Take 81 mg by mouth daily.    [provider]  hydrocerin (EUCERIN) CREA Apply 1 application topically 2 (two) times a week. Patient not taking:  Reported on 10/06/2017 09/23/17   Albertine GratesXu, Fang, MD  insulin aspart (NOVOLOG) 100 UNIT/ML injection Insulin sliding scale: Blood sugar  120-150   3units                       151-200   4units                       201-250   7units                       251- 300  11units                       301-350   15uints                       351-400   20units                       >400         call MD immediately 09/23/17   Albertine GratesXu, Fang, MD  LANTUS SOLOSTAR 100 UNIT/ML Solostar Pen Inject 15 Units into the skin daily at 10 pm. 09/23/17   Albertine GratesXu, Fang, MD  lisinopril (PRINIVIL,ZESTRIL) 5 MG tablet Take 1 tablet (5 mg total) by mouth daily. 09/24/17   Albertine GratesXu, Fang, MD  Multiple Vitamins-Minerals (PRESERVISION AREDS 2 PO) Take 1 tablet by mouth 2 (two) times daily.    [provider]  torsemide (DEMADEX) 20 MG tablet Take 1 tablet (20 mg total) by mouth every other day. 10/13/17   Rosalio MacadamiaGerhardt, Lori C, NP  traZODone (DESYREL) 50 MG tablet Take 1 tablet (50 mg total) by mouth at bedtime as needed for sleep. Patient taking differently: Take 50 mg by mouth at bedtime.  09/23/17   Albertine GratesXu, Fang, MD    Family History Family History  Problem Relation Age of Onset  . Cancer Mother   . Heart disease Father   . Obesity Sister   . CAD Neg Hx     Social History Social History   Tobacco Use  . Smoking status: Never Smoker  . Smokeless tobacco: Never Used  Substance Use Topics  . Alcohol use: No  . Drug use: No     Allergies   Patient has no known allergies.   Review of Systems Review of Systems  Constitutional: Negative for chills and fever.  HENT: Negative for congestion and sore throat.   Eyes: Negative for visual disturbance.  Respiratory: Negative for cough, chest tightness and shortness of breath.   Cardiovascular: Positive for leg swelling. Negative for chest pain and palpitations.  Gastrointestinal: Negative for abdominal pain, nausea and vomiting.  Genitourinary: Negative for dysuria.  Musculoskeletal:  Negative for myalgias.  Skin: Negative for rash.  Neurological: Negative for dizziness and light-headedness.  All other systems reviewed and are negative.    Physical Exam Updated Vital  Signs BP (!) 116/48 (BP Location: Right Arm)   Pulse 65   Temp (!) 97.5 F (36.4 C)   Resp 20   Wt 107.5 kg (237 lb)   SpO2 99%   BMI 34.01 kg/m   Physical Exam  Constitutional: He appears well-developed and well-nourished. No distress.  HENT:  Head: Normocephalic and atraumatic.  Mouth/Throat: Oropharynx is clear and moist.  Eyes: EOM are normal.  Small amount of clear discharge from conjunctivae bilaterally.  Neck: Normal range of motion. Neck supple.  Cardiovascular: Normal rate, regular rhythm, S1 normal and S2 normal.  No murmur heard. Heart sounds distant. Positive JVD. 1+ pitting lower extremity edema.  Patient does exhibit a slight amount of edema particularly in the left hand, but not extending up to the left antecubital fossa or arm.  Pulmonary/Chest: Effort normal and breath sounds normal. He has no wheezes. He has no rales.  Abdominal: Soft. He exhibits no distension. There is no tenderness. There is no guarding.  Musculoskeletal: Normal range of motion. He exhibits no edema or deformity.  Lymphadenopathy:    He has no cervical adenopathy.  Neurological: He is alert.  Cranial nerves grossly intact. Patient moves extremities symmetrically and with good coordination.  Skin: Skin is warm and dry. No rash noted. No erythema.  Psychiatric: He has a normal mood and affect. His behavior is normal. Judgment and thought content normal.  Nursing note and vitals reviewed.    ED Treatments / Results  Labs (all labs ordered are listed, but only abnormal results are displayed) Labs Reviewed  BRAIN NATRIURETIC PEPTIDE  TROPONIN I  BASIC METABOLIC PANEL  CBC WITH DIFFERENTIAL/PLATELET    EKG EKG Interpretation  Date/Time:  Saturday October 18 2017 09:38:41 EDT Ventricular Rate:    65 PR Interval:    QRS Duration: 173 QT Interval:  461 QTC Calculation: 480 R Axis:   162 Text Interpretation:  Ventricular-paced rhythm No further analysis attempted due to paced rhythm similar to prior 3/19 Confirmed by Meridee Score [phone]) on 10/18/2017 11:19:35 AM   Radiology No results found.  Procedures Procedures (including critical care time)  CRITICAL CARE Performed by: Elisha Ponder   Total critical care time: 30 minutes (elevated troponin)  Critical care time was exclusive of separately billable procedures and treating other patients.  Critical care was necessary to treat or prevent imminent or life-threatening deterioration.  Critical care was time spent personally by me on the following activities: development of treatment plan with patient and/or surrogate as well as nursing, discussions with consultants, evaluation of patient's response to treatment, examination of patient, obtaining history from patient or surrogate, ordering and performing treatments and interventions, ordering and review of laboratory studies, ordering and review of radiographic studies, pulse oximetry and re-evaluation of patient's condition.   Medications Ordered in ED Medications - No data to display   Initial Impression / Assessment and Plan / ED Course  I have reviewed the triage vital signs and the nursing notes.  Pertinent labs & imaging results that were available during my care of the patient were reviewed by me and considered in my medical decision making (see chart for details).  Clinical Course as of Oct 18 1699  Sat Oct 18, 2017  625 84 year old male with history of CHF and CKD who had a recent pacemaker placed is here with increased peripheral edema and weight gain.  Wound like during the last admission they have weaned off his furosemide and started him on torsemide.  Patient himself  is denying any specific complaints other than feeling some swelling in his arm and legs.   He is in no distress and satting well.  He does have peripheral edema symmetric in his lower extremities.  We are getting some lab work EKG chest x-ray and a duplex of his upper extremity.   [MB]  1202 Elevated troponin noted by RN.  Will call cardiology at this time.  Creatinine is significantly elevated.  We will also discussed diuresis management with cardiology given this creatinine.   [AM]  1205 Creatinine noted to be approximately double of prior evaluation on 3/15.  Due to this elevation creatinine, will hold diuresis until speaking to cardiology.  Will provide ASA at this time.   [AM]  1216 Spoke with Dr. Royann Shivers of [facility] who will come and evaluate before management decisions about anticoagulation are assessed.   [AM]  1253 Per cardiology, would like patient to be admitted per hospital medicine due to multiple acute medical problems.   [AM]  1445 Spoke with Dr. Arlean Hopping about the patient case who will come and evaluate the patient.   [AM]    Clinical Course User Index [AM] Elisha Ponder, PA-C [MB] Terrilee Files, MD    Patient nontoxic-appearing and in no acute distress.  Patient asymptomatic of any other problems aside from the weight gain.  There is also some noting of left arm swelling by patient's wife.  I see this most notable in the hand, but not see edema throughout the whole arm.  Will obtain troponin, EKG, BMP, CBC, chest x-ray, and DVT study of the left upper extremity.  See course above.  Laboratory workup is notable for a troponin of 27, unknown whether this is contributory via demand ischemia, recent pacemaker implant, or ACS.  Patient is having no active chest pain or shortness of breath to suggest ACS picture.  EKG nondiagnostic, as patient is paced.  Cardiology consulted per Dr. Royann Shivers, will trend the troponins.  324 mg of ASA administered per recommendations, and given that ACS is a concern, full dose despite renal impairment.  Patient exhibits an acute kidney  injury hyponatremia noted at 128, possibly dilutional.  Additionally, patient has a doubling in his creatinine from prior as well as BUN.  See below for prior.  Sodium  Date Value Ref Range Status  10/18/2017 128 (L) 135 - 145 mmol/L Final  10/18/2017 128 (L) 135 - 145 mmol/L Final  10/14/2017 137 135 - 145 mmol/L Final  10/10/2017 140 134 - 144 mmol/L Final  10/03/2017 140 134 - 144 mmol/L Final  09/29/2017 140 134 - 144 mmol/L Final  09/23/2017 140 135 - 145 mmol/L Final   Lab Results  Component Value Date   CREATININE 4.75 (H) 10/18/2017   CREATININE 4.85 (H) 10/18/2017   CREATININE 2.27 (H) 10/14/2017   Patient admitted per Toya Smothers, nurse practitioner of [facility] hospital[facility].  This is a shared visit with Dr. Meridee Score. Patient was independently evaluated by this attending physician. Attending physician consulted in evaluation and admission management.  Final Clinical Impressions(s) / ED Diagnoses   Final diagnoses:  Elevated troponin  Hyponatremia  Weight gain  AKI (acute kidney injury) [facility])    ED Discharge Orders    None       Delia Chimes 10/18/17 1710    Terrilee Files, MD 10/20/17 1350

## 2017-10-18 NOTE — Telephone Encounter (Signed)
Pt's wife called, pt has swelling in his left arm and hand. He is s/p PTVDP a few days ago. I suggested she come to the ED- concern for LUE DVT post pacemaker.  Corine ShelterLUKE Eliberto Sole PA-C 10/18/2017 7:43 AM

## 2017-10-18 NOTE — H&P (Addendum)
History and Physical    KIP CROPP ONG:[id number] DOB: 16-Aug-1924 DOA: 10/18/2017  PCP: Renford Dills, MD Patient coming from: home  Chief Complaint: swelling left arm/weight gain  HPI: Ronald Lindsey is a very pleasant 84 y.o. male with medical history significant diabetes, chronic kidney disease stage III, hypertension, second-degree Mobitz type I heart block status post pacemaker 4 days agosince emergency Department chief complaint swelling in his left arm and weight gain. Initial evaluation reveals uterus renal failure superimposed on chronic kidney disease stage III, hyponatremia and elevated troponin. [facility] are asked to admit.  Information is obtained from the patient and his daughter and caregiver at the bedside. He was in his usual state of health until he developed left upper extremity swelling and generalized edema shortly after pacemaker procedure. associated symptoms include weight gain and worsening lower extremity edema. He denies chest pain palpitations shortness of breath headache dizziness syncope or near-syncope. He denies abdominal pain nausea vomiting diarrhea constipation melena bright red blood per rectum. Denies dysuria hematuria frequency or urgency. He does report that prior to this pacemaker procedure he had been on Lasix and this was switched to torsemide at the time of discharge. He does report compliance with his medications.   ED Course: the emergency department he is afebrile hemodynamically stable and not hypoxic. He has an elevated troponin is evaluated by cardiology who opted to initiate a heparin  Review of Systems: As per HPI otherwise all other systems reviewed and are negative.   Ambulatory Status: ambulates independently is independent with ADLs  Past Medical History:  Diagnosis Date  . Acute diastolic CHF (congestive heart failure), NYHA class 3 (HCC) 09/13/2017  . Age-related macular degeneration, wet, both eyes (HCC)   . Arthritis    "lower back; years ago" (10/14/2017)  . Cellulitis of right leg 11/06/2011  . CHF (congestive heart failure) (HCC) 09/13/2017  . Diabetic neuropathy (HCC)   . Diabetic retinopathy of both eyes (HCC)   . Gastroenteritis 11/06/2011  . GERD (gastroesophageal reflux disease)   . HTN (hypertension)   . Mobitz (type) I (Wenckebach's) atrioventricular block 09/16/2017  . Pleural effusion 05/08/2015  . Presence of permanent cardiac pacemaker 10/14/2017  . Stage III chronic kidney disease (HCC)   . Type II diabetes mellitus (HCC)     Past Surgical History:  Procedure Laterality Date  . APPENDECTOMY    . BACK SURGERY    . BRAIN SURGERY    . 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 COLONOSCOPY  11/08/2011   Procedure: COLONOSCOPY;  Surgeon: Graylin Shiver, MD;  Location: [facility];  Service: Endoscopy;  Laterality: N/A;  with decompression  . INSERT / REPLACE / REMOVE PACEMAKER  10/14/2017  . LUMBAR DISC SURGERY     "cleaned up arthritis"  . PACEMAKER IMPLANT N/A 10/14/2017   Procedure: PACEMAKER IMPLANT;  Surgeon: Regan Lemming, MD;  Location: [facility];  Service: Cardiovascular;  Laterality: N/A;    Social History   Socioeconomic History  . Marital status: Married    Spouse name: Not on file  . Number of children: Not on file  . Years of education: Not on file  . Highest education level: Not on file  Occupational History  . Occupation: Retired  Engineer, production  . Financial resource strain: Not on file  . Food insecurity:    Worry: Not on file  Inability: Not on file  . Transportation needs:    Medical: Not on file    Non-medical: Not on file  Tobacco Use  . Smoking status: Never Smoker  . Smokeless tobacco: Never Used  Substance and Sexual Activity  . Alcohol use: No  . Drug use: No  . Sexual activity: Never  Lifestyle  . Physical activity:    Days per week: Not on file     Minutes per session: Not on file  . Stress: Not on file  Relationships  . Social connections:    Talks on phone: Not on file    Gets together: Not on file    Attends religious service: Not on file    Active member of club or organization: Not on file    Attends meetings of clubs or organizations: Not on file    Relationship status: Not on file  . Intimate partner violence:    Fear of current or ex partner: Not on file    Emotionally abused: Not on file    Physically abused: Not on file    Forced sexual activity: Not on file  Other Topics Concern  . Not on file  Social History Narrative   Pt lives with wife in [place], [place]    No Known Allergies  Family History  Problem Relation Age of Onset  . Cancer Mother   . Heart disease Father   . Obesity Sister   . CAD Neg Hx     Prior to Admission medications   Medication Sig Start Date End Date Taking? Authorizing Provider  acetaminophen (TYLENOL) 325 MG tablet Take 2 tablets (650 mg total) by mouth every 6 (six) hours as needed for mild pain or headache. 09/23/17  Yes Albertine Grates, MD  aspirin EC 81 MG tablet Take 81 mg by mouth daily.   Yes [provider]  hydrocerin (EUCERIN) CREA Apply 1 application topically 2 (two) times a week. 09/23/17  Yes Albertine Grates, MD  insulin aspart (NOVOLOG) 100 UNIT/ML injection Insulin sliding scale: Blood sugar  120-150   3units                       151-200   4units                       201-250   7units                       251- 300  11units                       301-350   15uints                       351-400   20units                       >400         call MD immediately 09/23/17  Yes Albertine Grates, MD  LANTUS SOLOSTAR 100 UNIT/ML Solostar Pen Inject 15 Units into the skin daily at 10 pm. 09/23/17  Yes Albertine Grates, MD  lisinopril (PRINIVIL,ZESTRIL) 5 MG tablet Take 1 tablet (5 mg total) by mouth daily. 09/24/17  Yes Albertine Grates, MD  Multiple Vitamins-Minerals (PRESERVISION AREDS 2 PO) Take 1 tablet by  mouth 2 (two) times daily.   Yes [provider]  torsemide (DEMADEX) 20 MG tablet Take  1 tablet (20 mg total) by mouth every other day. 10/13/17  Yes Rosalio Macadamia, NP  traZODone (DESYREL) 50 MG tablet Take 1 tablet (50 mg total) by mouth at bedtime as needed for sleep. Patient taking differently: Take 50 mg by mouth at bedtime.  09/23/17  Yes Albertine Grates, MD    Physical Exam: Vitals:   10/18/17 1230 10/18/17 1300 10/18/17 1345 10/18/17 1400  BP: (!) 117/53 (!) 126/58 (!) 117/55 (!) 110/52  Pulse: 63 62 (!) 59 (!) 59  Resp: 20 18 18 20   Temp:      SpO2: 100% 100% 100% 100%  Weight:         General:  Appears calm and comfortable, slightly pale sitting up in bed in no acute distress Eyes:  PERRL, EOMI, normal lids, iris ENT:  grossly normal hearing, lips & tongue, mucous membranes of his mouth are pink slightly dry Neck:  no LAD, masses or thyromegaly Cardiovascular:  RRR, no m/r/g. 1+ LE edema laterally  Respiratory:  CTA bilaterally, no w/r/r. Normal respiratory effort. Abdomen:  soft, ntnd, obese soft positive bowel sounds throughout no guarding or rebounding Skin:  no rash or induration seen on limited exam Musculoskeletal:  grossly normal tone BUE/BLE, good ROM, no bony abnormality.Upon inspection of his left arm and do not appreciate any notable swelling. Psychiatric:  grossly normal mood and affect, speech fluent and appropriate, AOx3 Neurologic:  CN 2-12 grossly intact, moves all extremities in coordinated fashion, sensation intact  Labs on Admission: I have personally reviewed following labs and imaging studies  CBC: Recent Labs  Lab 10/18/17 1042  WBC 5.7  NEUTROABS 3.7  HGB 13.2  HCT 39.2  MCV 101.6*  PLT 106*   Basic Metabolic Panel: Recent Labs  Lab 10/14/17 1239 10/18/17 1042  NA 137 128*  K 5.4* 5.3*  CL 106 98*  CO2 23 18*  GLUCOSE 176* 120*  BUN 58* 102*  CREATININE 2.27* 4.85*  CALCIUM 9.1 8.1*   GFR: Estimated Creatinine Clearance:  12.4 mL/min (A) (by C-G formula based on SCr of 4.85 mg/dL (H)). Liver Function Tests: No results for input(s): AST, ALT, ALKPHOS, BILITOT, PROT, ALBUMIN in the last 168 hours. No results for input(s): LIPASE, AMYLASE in the last 168 hours. No results for input(s): AMMONIA in the last 168 hours. Coagulation Profile: No results for input(s): INR, PROTIME in the last 168 hours. Cardiac Enzymes: Recent Labs  Lab 10/18/17 1042 10/18/17 1303  TROPONINI 27.01* 24.67*   BNP (last 3 results) Recent Labs    09/29/17 1044  PROBNP 2,151*   HbA1C: No results for input(s): HGBA1C in the last 72 hours. CBG: Recent Labs  Lab 10/14/17 1250 10/14/17 1726 10/14/17 2135 10/15/17 0708 10/15/17 1210  GLUCAP 150* 159* 165* 160* 237*   Lipid Profile: No results for input(s): CHOL, HDL, LDLCALC, TRIG, CHOLHDL, LDLDIRECT in the last 72 hours. Thyroid Function Tests: No results for input(s): TSH, T4TOTAL, FREET4, T3FREE, THYROIDAB in the last 72 hours. Anemia Panel: No results for input(s): VITAMINB12, FOLATE, FERRITIN, TIBC, IRON, RETICCTPCT in the last 72 hours. Urine analysis:    Component Value Date/Time   COLORURINE YELLOW 11/07/2011 1631   APPEARANCEUR CLEAR 11/07/2011 1631   LABSPEC 1.022 11/07/2011 1631   PHURINE 6.0 11/07/2011 1631   GLUCOSEU 250 (A) 11/07/2011 1631   HGBUR TRACE (A) 11/07/2011 1631   BILIRUBINUR NEGATIVE 11/07/2011 1631   KETONESUR NEGATIVE 11/07/2011 1631   PROTEINUR 30 (A) 11/07/2011 1631   UROBILINOGEN 0.2 11/07/2011 1631  NITRITE NEGATIVE 11/07/2011 1631   LEUKOCYTESUR NEGATIVE 11/07/2011 1631    Creatinine Clearance: Estimated Creatinine Clearance: 12.4 mL/min (A) (by C-G formula based on SCr of 4.85 mg/dL (H)).  Sepsis Labs: @LABRCNTIP (procalcitonin:4,lacticidven:4) ) Recent Results (from the past 240 hour(s))  Surgical PCR screen     Status: None   Collection Time: 10/14/17 12:39 PM  Result Value Ref Range Status   MRSA, PCR NEGATIVE NEGATIVE  Final   Staphylococcus aureus NEGATIVE NEGATIVE Final    Comment: (NOTE) The Xpert SA Assay (FDA approved for NASAL specimens in patients 88 years of age and older), is one component of a comprehensive surveillance program. It is not intended to diagnose infection nor to guide or monitor treatment. Performed at [facility], [address], [place], [place] [address]      Radiological Exams on Admission: Dg Chest 2 View  Result Date: 10/18/2017 CLINICAL DATA:  Fluid retention, recent pacemaker placement 4 days ago EXAM: CHEST - 2 VIEW COMPARISON:  10/15/2017 FINDINGS: LEFT subclavian transvenous sequential pacemaker with leads projecting at RIGHT atrium and RIGHT ventricle unchanged. Enlargement of cardiac silhouette. Mediastinal contours stable with atherosclerotic calcification of aorta noted. Scattered interstitial infiltrates at mid to lower lungs with associated basilar pleural effusions and atelectasis, greater on LEFT, question CHF. Upper lungs clear. No pneumothorax. Bones demineralized. IMPRESSION: Persistent mild pulmonary edema and bibasilar effusions/atelectasis greater on LEFT. Electronically Signed   By: Ulyses Southward M.D.   On: 10/18/2017 11:59    EKG: Ventricular-paced rhythm No further analysis attempted due to paced rhythm similar to prior 3/19  Assessment/Plan Principal Problem:   Acute on chronic heart failure (HCC) Active Problems:   Diabetic retinopathy (HCC)   HTN (hypertension)   GERD (gastroesophageal reflux disease)   DM (diabetes mellitus) (HCC)   Mobitz (type) I (Wenckebach's) atrioventricular block   Elevated troponin   Acute renal failure superimposed on stage 3 chronic kidney disease (HCC)   Hyponatremia    #1. Acute on chronic heart failure may be related to change in medications from Lasix to torsemide 4 days ago in setting of acute renal failure superimposed on stage III chronic kidney disease.. Chest x-ray with persistent pulmonary edema. Was  evaluated by cardiology who opined that patient has clinical evidence of mild volume overload and chest x-ray shows left heart failure changes, but renal function is substantially worse. Cardiology recommends holding off on diuretics until clarification of volume status established.upon examination left arm did not seem swollen. He did have bilateral lower extremity edema. Doppler of left upper extremity negative for DVT -Admit to telemetry -Echocardiogram per cardiology -Obtain daily weights -Monitor intake and output -Appreciate cardiology assistance  #2. Acute renal failure superimposed on stage III chronic kidney disease. Creatinine 4.8 p from 2.2 prior to pacemaker procedure. Potassium 5.3. She reports dealing "dehydrated". -hold nephrotoxins -monitor intake and output -Gentle IV fluids -consider renal US - await Nephrology recommendation  #3. Elevated troponin. Initial troponin 27. Likely related to recent pacemaker implantation. History of CAD. No chest pain. EKG without acute changes. Evaluated by cardiology who recommended echocardiogram and heparin drip -Heparin drip -Cycle troponin -continue aspirin -serial EKG -appreciate cardiology assistance  #4. Hyponatremia.sodium level 128. 4 days ago sodium level 138 prior to pacemaker procedure.likely related to #2. -serum osmolality -urine sodium -Urine osmolality -await nephrology recommendations  #5.hypertension. Controlled in the emergency department. Home medications include lisinopril, torsemide -Holding lisinopril and torsemide for now -Monitor  #6. Diabetes. Serum glucose 120 on admission. Home medications include  Lantus and sliding scale insulin -Obtain a hemoglobin A1c -Continue Lantus at a slightly lower dose than his home dose of 15 units -Sliding scale insulin for optimal control  DVT prophylaxis: heparin gtt Code Status: dnr  Family Communication: wife at bedside  Disposition Plan: home  Consults called:  nephrology and cards per ed  Admission status: inpatient    Gwenyth BenderBLACK,Siomara Burkel M MD [facility]  If 7PM-7AM, please contact night-coverage [email or web address] Password [email or web address]  10/18/2017, 2:53 PM

## 2017-10-18 NOTE — Progress Notes (Signed)
VASCULAR LAB PRELIMINARY  PRELIMINARY  PRELIMINARY  PRELIMINARY  Left upper extremity venous duplex completed.    Preliminary report:  There is no DVT or SVT noted in the left upper extremity.  Called report to Dr. Abbey ChattersButler  Ronald Lindsey, [facility], RVT 10/18/2017, 11:16 AM

## 2017-10-18 NOTE — ED Triage Notes (Signed)
[facility]. Patient had pacemaker placed three days ago, states he has gained 7 pounds since that day and was told to call his doctor if he gains more 3-4 pounds. He was told to come to ED by doctor's office. Patient alert and oriented and in no apparent distress. Denies pain, denies shortness of breath.

## 2017-10-19 ENCOUNTER — Inpatient Hospital Stay (HOSPITAL_COMMUNITY): Payer: Medicare Other

## 2017-10-19 DIAGNOSIS — I214 Non-ST elevation (NSTEMI) myocardial infarction: Secondary | ICD-10-CM

## 2017-10-19 DIAGNOSIS — I509 Heart failure, unspecified: Secondary | ICD-10-CM

## 2017-10-19 DIAGNOSIS — N179 Acute kidney failure, unspecified: Secondary | ICD-10-CM

## 2017-10-19 DIAGNOSIS — I340 Nonrheumatic mitral (valve) insufficiency: Secondary | ICD-10-CM

## 2017-10-19 LAB — GLUCOSE, CAPILLARY
GLUCOSE-CAPILLARY: 163 mg/dL — AB (ref 65–99)
GLUCOSE-CAPILLARY: 160 mg/dL — AB (ref 65–99)
GLUCOSE-CAPILLARY: 154 mg/dL — AB (ref 65–99)
Glucose-Capillary: 145 mg/dL — ABNORMAL HIGH (ref 65–99)

## 2017-10-19 LAB — BASIC METABOLIC PANEL
Anion gap: 10 (ref 5–15)
BUN: 106 mg/dL — AB (ref 6–20)
CHLORIDE: 99 mmol/L — AB (ref 101–111)
CO2: 20 mmol/L — AB (ref 22–32)
CREATININE: 4.74 mg/dL — AB (ref 0.61–1.24)
Calcium: 8.1 mg/dL — ABNORMAL LOW (ref 8.9–10.3)
GFR calc Af Amer: 11 mL/min — ABNORMAL LOW (ref >60)
GFR calc non Af Amer: 10 mL/min — ABNORMAL LOW (ref >60)
Glucose, Bld: 176 mg/dL — ABNORMAL HIGH (ref 65–99)
Potassium: 6 mmol/L — ABNORMAL HIGH (ref 3.5–5.1)
Sodium: 129 mmol/L — ABNORMAL LOW (ref 135–145)

## 2017-10-19 LAB — CBC
HEMATOCRIT: 38 % — AB (ref 39.0–52.0)
Hemoglobin: 12.7 g/dL — ABNORMAL LOW (ref 13.0–17.0)
MCH: 34 pg (ref 26.0–34.0)
MCHC: 33.4 g/dL (ref 30.0–36.0)
MCV: 101.6 fL — AB (ref 78.0–100.0)
PLATELETS: 120 10*3/uL — AB (ref 150–400)
RBC: 3.74 MIL/uL — ABNORMAL LOW (ref 4.22–5.81)
RDW: 14.2 % (ref 11.5–15.5)
WBC: 5.1 10*3/uL (ref 4.0–10.5)

## 2017-10-19 LAB — OSMOLALITY, URINE: OSMOLALITY UR: 339 mosm/kg (ref 300–900)

## 2017-10-19 LAB — TROPONIN I: Troponin I: 24.22 ng/mL (ref <0.03)

## 2017-10-19 LAB — ECHOCARDIOGRAM LIMITED
HEIGHTINCHES: 70 in
Weight: 3857.6 oz

## 2017-10-19 LAB — HEPARIN LEVEL (UNFRACTIONATED): Heparin Unfractionated: 0.48 IU/mL (ref 0.30–0.70)

## 2017-10-19 MED ORDER — PATIROMER SORBITEX CALCIUM 8.4 G PO PACK
8.4000 g | PACK | Freq: Every day | ORAL | Status: AC
  Administered 2017-10-19: 8.4 g via ORAL
  Filled 2017-10-19 (×4): qty 4

## 2017-10-19 NOTE — Progress Notes (Signed)
Met with patient and his wife and a friend of theirs, and discussed his severe renal failure and options.   They were all not in favor of undertaking dialysis and are in agreement to treat medically.  They are agreeable to meet with palliative care, have ordered consult.  Will follow.   Kelly Splinter MD [employer] pgr [phone]   10/19/2017, 5:04 PM

## 2017-10-19 NOTE — Progress Notes (Signed)
Progress Note  Patient Name: Ronald Lindsey Date of Encounter: 10/19/2017  Primary Cardiologist: Charlton HawsPeter Nishan, MD   Subjective   No chest pain, stable SOB, he slept in a chair.  Inpatient Medications    Scheduled Meds: . aspirin EC  81 mg Oral Daily  . insulin aspart  0-5 Units Subcutaneous QHS  . insulin aspart  0-9 Units Subcutaneous TID WC  . insulin glargine  10 Units Subcutaneous QHS  . patiromer  8.4 g Oral Daily  . sodium chloride flush  3 mL Intravenous Q12H  . traZODone  50 mg Oral QHS   Continuous Infusions: . sodium chloride    . heparin 1,300 Units/hr (10/19/17 0557)   PRN Meds: sodium chloride, acetaminophen, ondansetron (ZOFRAN) IV, sodium chloride flush   Vital Signs    Vitals:   10/18/17 1513 10/18/17 1954 10/19/17 0333 10/19/17 0511  BP: (!) 112/58 (!) 111/55  (!) 101/49  Pulse: 65 68  73  Resp: 20 18  18   Temp: 97.7 F (36.5 C) 98.1 F (36.7 C)  (!) 97.5 F (36.4 C)  TempSrc: Oral Oral  Oral  SpO2: 100% 99%  98%  Weight: 240 lb 8 oz (109.1 kg)  241 lb 1.6 oz (109.4 kg)   Height: 5\' 10"  (1.778 m)       Intake/Output Summary (Last 24 hours) at 10/19/2017 1056 Last data filed at 10/19/2017 0913 Gross per 24 hour  Intake 709.58 ml  Output 500 ml  Net 209.58 ml   Filed Weights   10/18/17 0940 10/18/17 1513 10/19/17 0333  Weight: 237 lb (107.5 kg) 240 lb 8 oz (109.1 kg) 241 lb 1.6 oz (109.4 kg)   Telemetry    Paced rhythm - Personally Reviewed  ECG    A-sensed, V paced rhythm- Personally Reviewed  Physical Exam   GEN: No acute distress.   Neck: No JVD Cardiac: RRR, no murmurs, rubs, or gallops.  Respiratory: mild crackles at bases bilaterally. PM in the Right upper chest wall with erythema and mild swelling, no discharge GI: Soft, nontender, non-distended  MS: Erythema and mild swelling of B/L LE up to mid calves. Neuro:  Nonfocal  Psych: Normal affect   Labs    Chemistry Recent Labs  Lab 10/18/17 1042 10/18/17 1351  10/19/17 0643  NA 128* 128* 129*  K 5.3* 5.2* 6.0*  CL 98* 98* 99*  CO2 18* 20* 20*  GLUCOSE 120* 151* 176*  BUN 102* 100* 106*  CREATININE 4.85* 4.75* 4.74*  CALCIUM 8.1* 8.1* 8.1*  GFRNONAA 9* 10* 10*  GFRAA 11* 11* 11*  ANIONGAP 12 10 10      Hematology Recent Labs  Lab 10/18/17 1042 10/19/17 0643  WBC 5.7 5.1  RBC 3.86* 3.74*  HGB 13.2 12.7*  HCT 39.2 38.0*  MCV 101.6* 101.6*  MCH 34.2* 34.0  MCHC 33.7 33.4  RDW 13.9 14.2  PLT 106* 120*    Cardiac Enzymes Recent Labs  Lab 10/18/17 1042 10/18/17 1303 10/18/17 1845 10/18/17 2330  TROPONINI 27.01* 24.67* 19.64* 24.22*   No results for input(s): TROPIPOC in the last 168 hours.   BNPNo results for input(s): BNP, PROBNP in the last 168 hours.   DDimer No results for input(s): DDIMER in the last 168 hours.   Radiology    Dg Chest 2 View  Result Date: 10/18/2017 CLINICAL DATA:  Fluid retention, recent pacemaker placement 4 days ago EXAM: CHEST - 2 VIEW COMPARISON:  10/15/2017 FINDINGS: LEFT subclavian transvenous sequential pacemaker with leads  projecting at RIGHT atrium and RIGHT ventricle unchanged. Enlargement of cardiac silhouette. Mediastinal contours stable with atherosclerotic calcification of aorta noted. Scattered interstitial infiltrates at mid to lower lungs with associated basilar pleural effusions and atelectasis, greater on LEFT, question CHF. Upper lungs clear. No pneumothorax. Bones demineralized. IMPRESSION: Persistent mild pulmonary edema and bibasilar effusions/atelectasis greater on LEFT. Electronically Signed   By: Ulyses Southward M.D.   On: 10/18/2017 11:59   Cardiac Studies     Patient Profile     84 y.o. male   Assessment & Plan    1. CHF: he has clinical evidence of mild volume overload and CXR shows left heart failure changes, but renal function is substantially worse. He is not in any respiratory distress so will hold off diuretics until we clarify the volume status. 2. NSTEMI - troponin  27, new LVEF decrease from 65-70% in February to 30-35% today, now with new basal and mid and inferoseptal, inferior, and apical inferior and septal  wall akinesis. Note coronary atherosclerosis in LAD and LCX on chest CT 2016.  With troponin of 27, he should have a noticeable new wall motion abnormality. Will start heparin. He is on ASA. No plan for invasive evaluation in asymptomatic 84 year old with acute renal failure. He has no chest pain.  3. Hyponatremia/hyperkalemia: likely related to acute on chronic renal failure. ECG with paced rhythm. 4. Ac on CKD: no contrast used during recent hospitalization, no NSAID use, does not appear to be hypovolemic, on the contrary. ACE inhibitor held yesterday. Nephrology consultation is pending. 5. 2nd deg AV block/recent PM: appears to have normal PM function. Echo showed mild pericardial effusion -personally reviewed.. 6. DM: increased risk of nephrotoxicity  For questions or updates, please contact [facility] Please consult [email or web address] for contact info under Cardiology/STEMI.     Signed, Tobias Alexander, MD  10/19/2017, 10:56 AM

## 2017-10-19 NOTE — Progress Notes (Signed)
  Echocardiogram 2D Echocardiogram has been performed.  Ronald SavoyCasey N Mystic Labo 10/19/2017, 11:50 AM

## 2017-10-19 NOTE — Progress Notes (Signed)
ANTICOAGULATION CONSULT NOTE Pharmacy Consult:  Heparin Indication: chest pain/ACS  No Known Allergies  Patient Measurements: Height: 5\' 10"  (177.8 cm) Weight: 240 lb 8 oz (109.1 kg) IBW/kg (Calculated) : 73 Heparin Dosing Weight: 96 kg  Vital Signs: Temp: 98.1 F (36.7 C) (03/23 1954) Temp Source: Oral (03/23 1954) BP: 111/55 (03/23 1954) Pulse Rate: 68 (03/23 1954)  Labs: Recent Labs    10/18/17 1042 10/18/17 1303 10/18/17 1351 10/18/17 1845 10/18/17 2330  HGB 13.2  --   --   --   --   HCT 39.2  --   --   --   --   PLT 106*  --   --   --   --   HEPARINUNFRC  --   --   --   --  0.47  CREATININE 4.85*  --  4.75*  --   --   TROPONINI 27.01* 24.67*  --  19.64*  --     Assessment: [name] presented with left arm/hand swelling.  Pharmacy consulted to initiate IV heparin for ACS.  Troponin 27.  Patient has AKI and thrombocytopenia.  No bleeding reported.  Initial heparin level is therapeutic.   Goal of Therapy:  Heparin level 0.3-0.7 units/ml Monitor platelets by anticoagulation protocol: Yes    Plan:  Continue heparin gtt at 1300 units/hr Check 8 hr heparin level Daily heparin level and CBC   Ronald Lindsey, Ronald Lindsey M 10/19/2017 12:18 AM

## 2017-10-19 NOTE — Progress Notes (Signed)
PROGRESS NOTE    Ronald Lindsey  URK:[id number] DOB: 11/04/1924 DOA: 10/18/2017 PCP: Renford DillsPolite, Ronald, MD    Brief Narrative:   84 y.o. male with a Past Medical History of diabetes, chronic kidney disease, arrhythmia, hypertension who presents with arm swelling and weight gain   Assessment & Plan:   Principal Problem:   Acute on chronic heart failure [facility](HCC) -Nephrology and Cardiology managing currently.  Active Problems:   Diabetic retinopathy (HCC) - stable    HTN (hypertension) - stable curretly    GERD (gastroesophageal reflux disease) - stable    DM (diabetes mellitus) (HCC) - Pt on Lantus and SSI    Mobitz (type) I (Wenckebach's) atrioventricular block - cardiology on board. Pt appearts to have normal PM function.    Elevated troponin/  Non-ST elevation (NSTEMI) myocardial infarction (HCC) - No plans for invasive evaluation in asymptomatic 90 per cardiology - cards to start heparin    Acute renal failure superimposed on stage 3 chronic kidney disease (HCC)   DVT prophylaxis: heparin infusion Code Status: DNR Family Communication: none at bedside. Disposition Plan: pending recs  From specialist   Consultants:   Cardiology  Nephrology   Procedures: none   Antimicrobials: none   Subjective: No new complaints reported.  Objective: Vitals:   10/18/17 1954 10/19/17 0333 10/19/17 0511 10/19/17 1259  BP: (!) 111/55  (!) 101/49 (!) 97/39  Pulse: 68  73 66  Resp: 18  18 18   Temp: 98.1 F (36.7 C)  (!) 97.5 F (36.4 C) 97.6 F (36.4 C)  TempSrc: Oral  Oral Oral  SpO2: 99%  98% 100%  Weight:  109.4 kg (241 lb 1.6 oz)    Height:        Intake/Output Summary (Last 24 hours) at 10/19/2017 1552 Last data filed at 10/19/2017 1458 Gross per 24 hour  Intake 709.58 ml  Output 500 ml  Net 209.58 ml   Filed Weights   10/18/17 0940 10/18/17 1513 10/19/17 0333  Weight: 107.5 kg (237 lb) 109.1 kg (240 lb 8 oz) 109.4 kg (241 lb 1.6 oz)     Examination:  General exam: Appears calm and comfortable  Respiratory system: Clear to auscultation. Respiratory effort normal. Cardiovascular system: S1 & S2 heard, RRR. No JVD, murmurs, rubs, gallops or clicks. No pedal edema. Gastrointestinal system: Abdomen is nondistended, soft and nontender. No organomegaly or masses felt. Normal bowel sounds heard. Central nervous system: alert and awake. No facial asymmetry   Data Reviewed: I have personally reviewed following labs and imaging studies  CBC: Recent Labs  Lab 10/18/17 1042 10/19/17 0643  WBC 5.7 5.1  NEUTROABS 3.7  --   HGB 13.2 12.7*  HCT 39.2 38.0*  MCV 101.6* 101.6*  PLT 106* 120*   Basic Metabolic Panel: Recent Labs  Lab 10/14/17 1239 10/18/17 1042 10/18/17 1351 10/19/17 0643  NA 137 128* 128* 129*  K 5.4* 5.3* 5.2* 6.0*  CL 106 98* 98* 99*  CO2 23 18* 20* 20*  GLUCOSE 176* 120* 151* 176*  BUN 58* 102* 100* 106*  CREATININE 2.27* 4.85* 4.75* 4.74*  CALCIUM 9.1 8.1* 8.1* 8.1*   GFR: Estimated Creatinine Clearance: 12.8 mL/min (A) (by C-G formula based on SCr of 4.74 mg/dL (H)). Liver Function Tests: No results for input(s): AST, ALT, ALKPHOS, BILITOT, PROT, ALBUMIN in the last 168 hours. No results for input(s): LIPASE, AMYLASE in the last 168 hours. No results for input(s): AMMONIA in the last 168 hours. Coagulation Profile: No results for input(s):  INR, PROTIME in the last 168 hours. Cardiac Enzymes: Recent Labs  Lab 10/18/17 1042 10/18/17 1303 10/18/17 1845 10/18/17 2330  TROPONINI 27.01* 24.67* 19.64* 24.22*   BNP (last 3 results) Recent Labs    09/29/17 1044  PROBNP 2,151*   HbA1C: Recent Labs    10/18/17 1351  HGBA1C 6.2*   CBG: Recent Labs  Lab 10/18/17 1522 10/18/17 1625 10/18/17 2106 10/19/17 0742 10/19/17 1254  GLUCAP 150* 195* 159* 163* 145*   Lipid Profile: No results for input(s): CHOL, HDL, LDLCALC, TRIG, CHOLHDL, LDLDIRECT in the last 72 hours. Thyroid  Function Tests: No results for input(s): TSH, T4TOTAL, FREET4, T3FREE, THYROIDAB in the last 72 hours. Anemia Panel: No results for input(s): VITAMINB12, FOLATE, FERRITIN, TIBC, IRON, RETICCTPCT in the last 72 hours. Sepsis Labs: No results for input(s): PROCALCITON, LATICACIDVEN in the last 168 hours.  Recent Results (from the past 240 hour(s))  Surgical PCR screen     Status: None   Collection Time: 10/14/17 12:39 PM  Result Value Ref Range Status   MRSA, PCR NEGATIVE NEGATIVE Final   Staphylococcus aureus NEGATIVE NEGATIVE Final    Comment: (NOTE) The Xpert SA Assay (FDA approved for NASAL specimens in patients 30 years of age and older), is one component of a comprehensive surveillance program. It is not intended to diagnose infection nor to guide or monitor treatment. Performed at [facility], [address], [place], [place] [address]          Radiology Studies: Dg Chest 2 View  Result Date: 10/19/2017 CLINICAL DATA:  Follow-up edema. EXAM: CHEST - 2 VIEW COMPARISON:  October 18, 2017 FINDINGS: Left-sided pleural effusion and underlying opacity is stable. Stable pacemaker. The heart, hila, mediastinum, lungs, and pleura are otherwise unremarkable. No overt edema. IMPRESSION: Stable left-sided pleural effusion with underlying opacity. No overt pulmonary edema. Electronically Signed   By: Gerome Sam III M.D   On: 10/19/2017 11:22   Dg Chest 2 View  Result Date: 10/18/2017 CLINICAL DATA:  Fluid retention, recent pacemaker placement 4 days ago EXAM: CHEST - 2 VIEW COMPARISON:  10/15/2017 FINDINGS: LEFT subclavian transvenous sequential pacemaker with leads projecting at RIGHT atrium and RIGHT ventricle unchanged. Enlargement of cardiac silhouette. Mediastinal contours stable with atherosclerotic calcification of aorta noted. Scattered interstitial infiltrates at mid to lower lungs with associated basilar pleural effusions and atelectasis, greater on LEFT, question CHF.  Upper lungs clear. No pneumothorax. Bones demineralized. IMPRESSION: Persistent mild pulmonary edema and bibasilar effusions/atelectasis greater on LEFT. Electronically Signed   By: Ulyses Southward M.D.   On: 10/18/2017 11:59   Scheduled Meds: . aspirin EC  81 mg Oral Daily  . insulin aspart  0-5 Units Subcutaneous QHS  . insulin aspart  0-9 Units Subcutaneous TID WC  . insulin glargine  10 Units Subcutaneous QHS  . patiromer  8.4 g Oral Daily  . sodium chloride flush  3 mL Intravenous Q12H  . traZODone  50 mg Oral QHS   Continuous Infusions: . sodium chloride    . heparin 1,300 Units/hr (10/19/17 0557)     LOS: 1 day    Time spent: > 35 minutes  Penny Pia, MD [facility] Pager [phone]  If 7PM-7AM, please contact night-coverage [email or web address] Password [facility] 10/19/2017, 3:52 PM

## 2017-10-19 NOTE — Progress Notes (Signed)
[facility] Progress Note  Subjective: feeling ok.  CXR today a little better, dec'd vasc congestion. Pt denies any SOB, orthopnea or DOE.  ECHO showed low EF 25-30%  Vitals:   10/18/17 1513 10/18/17 1954 10/19/17 0333 10/19/17 0511  BP: (!) 112/58 (!) 111/55  (!) 101/49  Pulse: 65 68  73  Resp: 20 18  18   Temp: 97.7 F (36.5 C) 98.1 F (36.7 C)  (!) 97.5 F (36.4 C)  TempSrc: Oral Oral  Oral  SpO2: 100% 99%  98%  Weight: 109.1 kg (240 lb 8 oz)  109.4 kg (241 lb 1.6 oz)   Height: 5\' 10"  (1.778 m)       Inpatient medications: . aspirin EC  81 mg Oral Daily  . insulin aspart  0-5 Units Subcutaneous QHS  . insulin aspart  0-9 Units Subcutaneous TID WC  . insulin glargine  10 Units Subcutaneous QHS  . patiromer  8.4 g Oral Daily  . sodium chloride flush  3 mL Intravenous Q12H  . traZODone  50 mg Oral QHS   . sodium chloride    . heparin 1,300 Units/hr (10/19/17 0557)   sodium chloride, acetaminophen, ondansetron (ZOFRAN) IV, sodium chloride flush  Exam: Gen elderly WM, WN WD No rash, cyanosis or gangrene Sclera anicteric, throat clear  N+ mild-mod jvd Chest soft rales bilat bases 1/3 up RRR no MRG Abd soft ntnd no mass or ascites +bs GU normal male MS no joint effusions or deformity Ext 1+ bilat L edema, no wounds or ulcers Neuro is alert, Ox 3 , nf   Home meds: - lisinopril 5 mg qd/ demadex 20 mg every other day - novolog SSI/ lantus 15 - ecasa 81/ desyrel/ MVI  CXR 3/24 improved CHF CT abd 2016 > bilat renal atrophy and cysts ECHO 2/19 > LVEF 65% UA (2013) > 30 protein, 0-2 rbc    Impression: 1  AKI on CKD IV - baseline creat is 1.8- 2.4.  Up to 4.8 here. ECHO showing new WMA's and low EF , trop's very high c/w silent MI.  Has paced rhythm.  Creat stable today but quite azotemic overall.  Have discussed HD with the patient, he is active and mobile.  Did not make a strong recommendation for dialysis, w/ comorbidities would likely not  improve QOL.  Will discuss more w/ other family later today.  2  +trop - 27 > 24, per cardiology 3  DM on insulin 4  HTN - hold acei for now, BP's are not high 5  SP PPM    Plan - as above   Vinson Moselleob Jowell Bossi MD [facility] pager [phone]   10/19/2017, 11:28 AM   Recent Labs  Lab 10/18/17 1042 10/18/17 1351 10/19/17 0643  NA 128* 128* 129*  K 5.3* 5.2* 6.0*  CL 98* 98* 99*  CO2 18* 20* 20*  GLUCOSE 120* 151* 176*  BUN 102* 100* 106*  CREATININE 4.85* 4.75* 4.74*  CALCIUM 8.1* 8.1* 8.1*   No results for input(s): AST, ALT, ALKPHOS, BILITOT, PROT, ALBUMIN in the last 168 hours. Recent Labs  Lab 10/18/17 1042 10/19/17 0643  WBC 5.7 5.1  NEUTROABS 3.7  --   HGB 13.2 12.7*  HCT 39.2 38.0*  MCV 101.6* 101.6*  PLT 106* 120*   Iron/TIBC/Ferritin/ %Sat No results found for: IRON, TIBC, FERRITIN, IRONPCTSAT

## 2017-10-19 NOTE — Progress Notes (Signed)
ANTICOAGULATION CONSULT NOTE Pharmacy Consult:  Heparin Indication: chest pain/ACS  Patient Measurements: Height: 5\' 10"  (177.8 cm) Weight: 241 lb 1.6 oz (109.4 kg) IBW/kg (Calculated) : 73 Heparin Dosing Weight: 96 kg  Labs: Recent Labs    10/18/17 1042 10/18/17 1303 10/18/17 1351 10/18/17 1845 10/18/17 2330 10/19/17 0643  HGB 13.2  --   --   --   --  12.7*  HCT 39.2  --   --   --   --  38.0*  PLT 106*  --   --   --   --  120*  HEPARINUNFRC  --   --   --   --  0.47 0.48  CREATININE 4.85*  --  4.75*  --   --   --   TROPONINI 27.01* 24.67*  --  19.64* 24.22*  --     Assessment: [name] presented with left arm/hand swelling.  Pharmacy consulted to initiate IV heparin for ACS.  Troponin 27.  Patient has AKI and thrombocytopenia.  No bleeding reported.  Initial and confirmatory heparin levels are therapeutic. PLTs are low, but slight increase from yesterday. Hgb stable.   Goal of Therapy:  Heparin level 0.3-0.7 units/ml Monitor platelets by anticoagulation protocol: Yes    Plan:  Continue heparin gtt at 1300 units/hr Daily heparin level and CBC   Nolen MuAustin J Lucas PharmD PGY1 Pharmacy Practice Resident 10/19/2017 8:01 AM Pager: [phone] Phone: [phone]

## 2017-10-20 DIAGNOSIS — I5023 Acute on chronic systolic (congestive) heart failure: Secondary | ICD-10-CM

## 2017-10-20 DIAGNOSIS — N186 End stage renal disease: Secondary | ICD-10-CM

## 2017-10-20 DIAGNOSIS — Z66 Do not resuscitate: Secondary | ICD-10-CM

## 2017-10-20 DIAGNOSIS — Z992 Dependence on renal dialysis: Secondary | ICD-10-CM

## 2017-10-20 DIAGNOSIS — Z515 Encounter for palliative care: Secondary | ICD-10-CM

## 2017-10-20 LAB — HEPARIN LEVEL (UNFRACTIONATED): HEPARIN UNFRACTIONATED: 0.33 [IU]/mL (ref 0.30–0.70)

## 2017-10-20 LAB — GLUCOSE, CAPILLARY
GLUCOSE-CAPILLARY: 129 mg/dL — AB (ref 65–99)
GLUCOSE-CAPILLARY: 140 mg/dL — AB (ref 65–99)
Glucose-Capillary: 137 mg/dL — ABNORMAL HIGH (ref 65–99)
Glucose-Capillary: 125 mg/dL — ABNORMAL HIGH (ref 65–99)

## 2017-10-20 LAB — CBC
HEMATOCRIT: 37.3 % — AB (ref 39.0–52.0)
Hemoglobin: 12.4 g/dL — ABNORMAL LOW (ref 13.0–17.0)
MCH: 33.3 pg (ref 26.0–34.0)
MCHC: 33.2 g/dL (ref 30.0–36.0)
MCV: 100.3 fL — ABNORMAL HIGH (ref 78.0–100.0)
Platelets: 132 10*3/uL — ABNORMAL LOW (ref 150–400)
RBC: 3.72 MIL/uL — ABNORMAL LOW (ref 4.22–5.81)
RDW: 14.2 % (ref 11.5–15.5)
WBC: 6.7 10*3/uL (ref 4.0–10.5)

## 2017-10-20 LAB — BASIC METABOLIC PANEL
Anion gap: 11 (ref 5–15)
BUN: 115 mg/dL — AB (ref 6–20)
CALCIUM: 8.3 mg/dL — AB (ref 8.9–10.3)
CO2: 19 mmol/L — ABNORMAL LOW (ref 22–32)
CREATININE: 5.15 mg/dL — AB (ref 0.61–1.24)
Chloride: 98 mmol/L — ABNORMAL LOW (ref 101–111)
GFR calc Af Amer: 10 mL/min — ABNORMAL LOW (ref >60)
GFR, EST NON AFRICAN AMERICAN: 9 mL/min — AB (ref >60)
GLUCOSE: 150 mg/dL — AB (ref 65–99)
Potassium: 5.8 mmol/L — ABNORMAL HIGH (ref 3.5–5.1)
Sodium: 128 mmol/L — ABNORMAL LOW (ref 135–145)

## 2017-10-20 MED ORDER — LORAZEPAM 0.5 MG PO TABS
0.5000 mg | ORAL_TABLET | ORAL | Status: DC | PRN
Start: 2017-10-20 — End: 2017-10-21

## 2017-10-20 MED ORDER — MORPHINE SULFATE (CONCENTRATE) 10 MG/0.5ML PO SOLN: 5.0000 mg | ORAL | Status: DC | PRN

## 2017-10-20 MED ORDER — STERILE WATER FOR INJECTION IV SOLN
INTRAVENOUS | Status: DC
  Administered 2017-10-20 – 2017-10-21 (×3): via INTRAVENOUS
  Filled 2017-10-20 (×5): qty 850

## 2017-10-20 MED ORDER — CLOPIDOGREL BISULFATE 75 MG PO TABS
75.0000 mg | ORAL_TABLET | Freq: Every day | ORAL | Status: DC
  Administered 2017-10-20 – 2017-10-21 (×2): 75 mg via ORAL
  Filled 2017-10-20 (×2): qty 1

## 2017-10-20 NOTE — Progress Notes (Signed)
[facility] ROUNDING NOTE   Subjective:   84 y.o. year-old with hx of CKD IV, HTN, diast CHF, DM on insulin who presented to ED this morning with c/o wt gain and ankle swelling.  Labs showed BUN  100 and creat 4.85  Patient appears to be stable  No plans for dialysis and agree that palliative medicine should be involved  Urine output 300 cc  Weight stable 241 lbs no contrast NSAIDS   It appears that there was lisinopril use prior to admission   This has been held as has his every other day demedex   Creatinine has continued to rise there is also worsening metabolic acidosis and hyperkalemia   Objective:  Vital signs in last 24 hours:  Temp:  [97.6 F (36.4 C)-97.8 F (36.6 C)] 97.7 F (36.5 C) (03/25 0500) Pulse Rate:  [66-73] 69 (03/25 0500) Resp:  [18-20] 20 (03/25 0500) BP: (94-99)/(39-46) 94/42 (03/25 0500) SpO2:  [96 %-100 %] 96 % (03/25 0500) Weight:  [241 lb 3.2 oz (109.4 kg)] 241 lb 3.2 oz (109.4 kg) (03/25 0500)  Weight change: 4 lb 3.2 oz (1.905 kg) Filed Weights   10/18/17 1513 10/19/17 0333 10/20/17 0500  Weight: 240 lb 8 oz (109.1 kg) 241 lb 1.6 oz (109.4 kg) 241 lb 3.2 oz (109.4 kg)    Intake/Output: I/O last 3 completed shifts: In: 1779.5 [P.O.:1370; I.V.:409.5] Out: 500 [Urine:500]   Intake/Output this shift:  Total I/O In: -  Out: 100 [Urine:100]  CVS- RRR RS- CTA ABD- BS present soft non-distended EXT- 1 +  edema   Basic Metabolic Panel: Recent Labs  Lab 10/14/17 1239 10/18/17 1042 10/18/17 1351 10/19/17 0643 10/20/17 0618  NA 137 128* 128* 129* 128*  K 5.4* 5.3* 5.2* 6.0* 5.8*  CL 106 98* 98* 99* 98*  CO2 23 18* 20* 20* 19*  GLUCOSE 176* 120* 151* 176* 150*  BUN 58* 102* 100* 106* 115*  CREATININE 2.27* 4.85* 4.75* 4.74* 5.15*  CALCIUM 9.1 8.1* 8.1* 8.1* 8.3*    Liver Function Tests: No results for input(s): AST, ALT, ALKPHOS, BILITOT, PROT, ALBUMIN in the last 168 hours. No results for input(s): LIPASE, AMYLASE in the  last 168 hours. No results for input(s): AMMONIA in the last 168 hours.  CBC: Recent Labs  Lab 10/18/17 1042 10/19/17 0643 10/20/17 0618  WBC 5.7 5.1 6.7  NEUTROABS 3.7  --   --   HGB 13.2 12.7* 12.4*  HCT 39.2 38.0* 37.3*  MCV 101.6* 101.6* 100.3*  PLT 106* 120* 132*    Cardiac Enzymes: Recent Labs  Lab 10/18/17 1042 10/18/17 1303 10/18/17 1845 10/18/17 2330  TROPONINI 27.01* 24.67* 19.64* 24.22*    BNP: Invalid input(s): POCBNP  CBG: Recent Labs  Lab 10/19/17 0742 10/19/17 1254 10/19/17 1636 10/19/17 2102 10/20/17 0731  GLUCAP 163* 145* 160* 154* 129*    Microbiology: Results for orders placed or performed during the hospital encounter of 10/14/17  Surgical PCR screen     Status: None   Collection Time: 10/14/17 12:39 PM  Result Value Ref Range Status   MRSA, PCR NEGATIVE NEGATIVE Final   Staphylococcus aureus NEGATIVE NEGATIVE Final    Comment: (NOTE) The Xpert SA Assay (FDA approved for NASAL specimens in patients 34 years of age and older), is one component of a comprehensive surveillance program. It is not intended to diagnose infection nor to guide or monitor treatment. Performed at [facility], [address], [place], [place] [address]  Coagulation Studies: No results for input(s): LABPROT, INR in the last 72 hours.  Urinalysis: Recent Labs    10/18/17 2233  COLORURINE YELLOW  LABSPEC 1.011  PHURINE 5.0  GLUCOSEU NEGATIVE  HGBUR NEGATIVE  BILIRUBINUR NEGATIVE  KETONESUR NEGATIVE  PROTEINUR NEGATIVE  NITRITE NEGATIVE  LEUKOCYTESUR NEGATIVE      Imaging: Dg Chest 2 View  Result Date: 10/19/2017 CLINICAL DATA:  Follow-up edema. EXAM: CHEST - 2 VIEW COMPARISON:  October 18, 2017 FINDINGS: Left-sided pleural effusion and underlying opacity is stable. Stable pacemaker. The heart, hila, mediastinum, lungs, and pleura are otherwise unremarkable. No overt edema. IMPRESSION: Stable left-sided pleural effusion with underlying  opacity. No overt pulmonary edema. Electronically Signed   By: Gerome Samavid  Williams III M.D   On: 10/19/2017 11:22   Dg Chest 2 View  Result Date: 10/18/2017 CLINICAL DATA:  Fluid retention, recent pacemaker placement 4 days ago EXAM: CHEST - 2 VIEW COMPARISON:  10/15/2017 FINDINGS: LEFT subclavian transvenous sequential pacemaker with leads projecting at RIGHT atrium and RIGHT ventricle unchanged. Enlargement of cardiac silhouette. Mediastinal contours stable with atherosclerotic calcification of aorta noted. Scattered interstitial infiltrates at mid to lower lungs with associated basilar pleural effusions and atelectasis, greater on LEFT, question CHF. Upper lungs clear. No pneumothorax. Bones demineralized. IMPRESSION: Persistent mild pulmonary edema and bibasilar effusions/atelectasis greater on LEFT. Electronically Signed   By: Ulyses SouthwardMark  Boles M.D.   On: 10/18/2017 11:59   [place] Renal  Result Date: 10/19/2017 CLINICAL DATA:  Acute kidney injury. EXAM: RENAL / URINARY TRACT ULTRASOUND COMPLETE COMPARISON:  Abdomen and pelvis CT 06/15/2015 FINDINGS: Right Kidney: Length: 11.6 cm. Exophytic simple appearing cyst measures up to 11.5 cm which is similar to prior CT where it measured 10.7 cm in long axis. No hydronephrosis. Left Kidney: Length: 9.0 cm. Cortical thinning evident. 13 mm exophytic lesion in the interpolar region does not have ultrasound imaging features suggesting cyst. This may be the 7 mm exophytic lesion seen on the CT scan from 1.5 years ago. No hydronephrosis. Bladder: Appears normal for degree of bladder distention. IMPRESSION: 1. 13 mm exophytic lesion interpolar left kidney not suggestive of a cyst by ultrasound. This may be an exophytic lesion seen on the previous noncontrast CT, but interval enlargement is suggested. MRI without and with contrast could be used to further evaluate as clinically warranted. 2. No evidence for hydronephrosis in either kidney. Electronically Signed   By: Kennith CenterEric  Mansell  M.D.   On: 10/19/2017 16:58     Medications:   . sodium chloride    . heparin 1,300 Units/hr (10/20/17 0017)   . aspirin EC  81 mg Oral Daily  . clopidogrel  75 mg Oral Daily  . insulin aspart  0-5 Units Subcutaneous QHS  . insulin aspart  0-9 Units Subcutaneous TID WC  . insulin glargine  10 Units Subcutaneous QHS  . patiromer  8.4 g Oral Daily  . sodium chloride flush  3 mL Intravenous Q12H  . traZODone  50 mg Oral QHS   sodium chloride, acetaminophen, ondansetron (ZOFRAN) IV, sodium chloride flush  Assessment/ Plan:  1  AKI on CKD IV - baseline creat is 1.8- 2.4.  Up to 4.8 today.  Vol status not clear, CXR +CHF but not vol overloaded on exam, pt is asymptomatic and troponin is very high, ?ischemia related pulm edema. Will hold off on diuretics for now. Avoid acei/ arb's/ nsaids.  Not candidate for dialysis 2  +trop - 27 > 24, per cardiology 3  DM on  insulin 4  HTN - hold acei for now, BP's are not high 5  SP PPM  6. Hyperkalemia related to the above will start IV bicarbonate low dose 7. Metabolic acidosis start IV bicarbonate        LOS: 2 Lakia Gritton W @TODAY @10 :42 AM

## 2017-10-20 NOTE — Progress Notes (Signed)
[facility] ([facility])  [facility] referral center received [facility]iles CrockerBrenda Chandler for [facility] after discharges. Chart reviewed and spoke with patient and spouse. [facility].   Spoke with spouse by phone to initiate education related to hospice philosophy, services and team approach to care. Spouse verbalized understanding of information provided. Per spouse, plan is for patient to discharge home via [facility]. Per spouse, discharge date has not been determined.   DME need discussed. Hospital bed with over bed table have been ordered through [facility] per spouse's request. [facility] caregiver [facility]. Spouse at home this evening arranging house in anticipation of equipment delivery. Discharge address has been verified and is correct in Epic.  [facility] referral center aware of above and will contact spouse to arrange RN visit at home after discharge. Spouse is aware she will be contacted before someone comes out.   Please send out of facility DNR home with patient.  Please send with patient scripts for any medication he does not have including comfort medications.   Voice message and text message sent to [facility] re this patient.   Please call with hospice related questions.   Thank you,  Forrestine Himva Davis, LCSW [phone]

## 2017-10-20 NOTE — Progress Notes (Signed)
PROGRESS NOTE    Ronald MaroonReid D Jolliffe  EAV:[id number] DOB: 01/10/1925 DOA: 10/18/2017 PCP: Renford DillsPolite, Ronald, MD    Brief Narrative:   84 y.o. male with a Past Medical History of diabetes, chronic kidney disease, arrhythmia, hypertension who presents with arm swelling and weight gain  Assessment & Plan:   Principal Problem:   Acute on chronic heart failure [facility](HCC) - Nephrology and Cardiology managing currently. - Both specialist recommending Palliative Care team.  STEMI - Pt never had chest pain has Echo reporting EF of 25-30% which is change from last evaluation on February.   Active Problems:   Diabetic retinopathy (HCC) - stable    HTN (hypertension) - stable curretly    GERD (gastroesophageal reflux disease) - stable    DM (diabetes mellitus) (HCC) - Pt on Lantus and SSI    Mobitz (type) I (Wenckebach's) atrioventricular block - cardiology on board. Pt appearts to have normal PM function.    Elevated troponin/  Non-ST elevation (NSTEMI) myocardial infarction (HCC) - No plans for invasive evaluation in asymptomatic 89 per cardiology - cards to start heparin    Acute renal failure superimposed on stage 3 chronic kidney disease (HCC), Pt has acute on chronic renal failure and patient has declined dialysis. - Nephrology states that patient is not candidate for dialysis.   DVT prophylaxis: heparin infusion Code Status: DNR Family Communication: none at bedside. Disposition Plan: Pt to transition home with hospice.   Consultants:   Cardiology  Nephrology   Procedures: none   Antimicrobials: none   Subjective: Pt has no new complaints currently and is interested in palliative care consult.  Objective: Vitals:   10/19/17 1259 10/19/17 2033 10/20/17 0500 10/20/17 1231  BP: (!) 97/39 (!) 99/46 (!) 94/42 (!) 107/50  Pulse: 66 73 69 65  Resp: 18 20 20 20   Temp: 97.6 F (36.4 C) 97.8 F (36.6 C) 97.7 F (36.5 C) 97.6 F (36.4 C)  TempSrc: Oral Oral Oral Oral    SpO2: 100% 97% 96% 99%  Weight:   109.4 kg (241 lb 3.2 oz)   Height:        Intake/Output Summary (Last 24 hours) at 10/20/2017 1330 Last data filed at 10/20/2017 0936 Gross per 24 hour  Intake 1309.92 ml  Output 300 ml  Net 1009.92 ml   Filed Weights   10/18/17 1513 10/19/17 0333 10/20/17 0500  Weight: 109.1 kg (240 lb 8 oz) 109.4 kg (241 lb 1.6 oz) 109.4 kg (241 lb 3.2 oz)    Examination:  General exam: Appears calm and comfortable, in nad.  Respiratory system: Clear to auscultation. Respiratory effort normal. Equal chest rise.  Cardiovascular system: S1 & S2 heard, RRR. No JVD, murmurs, rubs, gallops or clicks. No pedal edema. Gastrointestinal system: Abdomen is nondistended, soft and nontender. No organomegaly or masses felt. Normal bowel sounds heard. Central nervous system: alert and awake. No facial asymmetry  Data Reviewed: I have personally reviewed following labs and imaging studies  CBC: Recent Labs  Lab 10/18/17 1042 10/19/17 0643 10/20/17 0618  WBC 5.7 5.1 6.7  NEUTROABS 3.7  --   --   HGB 13.2 12.7* 12.4*  HCT 39.2 38.0* 37.3*  MCV 101.6* 101.6* 100.3*  PLT 106* 120* 132*   Basic Metabolic Panel: Recent Labs  Lab 10/14/17 1239 10/18/17 1042 10/18/17 1351 10/19/17 0643 10/20/17 0618  NA 137 128* 128* 129* 128*  K 5.4* 5.3* 5.2* 6.0* 5.8*  CL 106 98* 98* 99* 98*  CO2 23 18* 20* 20*  19*  GLUCOSE 176* 120* 151* 176* 150*  BUN 58* 102* 100* 106* 115*  CREATININE 2.27* 4.85* 4.75* 4.74* 5.15*  CALCIUM 9.1 8.1* 8.1* 8.1* 8.3*   GFR: Estimated Creatinine Clearance: 11.8 mL/min (A) (by C-G formula based on SCr of 5.15 mg/dL (H)). Liver Function Tests: No results for input(s): AST, ALT, ALKPHOS, BILITOT, PROT, ALBUMIN in the last 168 hours. No results for input(s): LIPASE, AMYLASE in the last 168 hours. No results for input(s): AMMONIA in the last 168 hours. Coagulation Profile: No results for input(s): INR, PROTIME in the last 168 hours. Cardiac  Enzymes: Recent Labs  Lab 10/18/17 1042 10/18/17 1303 10/18/17 1845 10/18/17 2330  TROPONINI 27.01* 24.67* 19.64* 24.22*   BNP (last 3 results) Recent Labs    09/29/17 1044  PROBNP 2,151*   HbA1C: Recent Labs    10/18/17 1351  HGBA1C 6.2*   CBG: Recent Labs  Lab 10/19/17 1254 10/19/17 1636 10/19/17 2102 10/20/17 0731 10/20/17 1228  GLUCAP 145* 160* 154* 129* 137*   Lipid Profile: No results for input(s): CHOL, HDL, LDLCALC, TRIG, CHOLHDL, LDLDIRECT in the last 72 hours. Thyroid Function Tests: No results for input(s): TSH, T4TOTAL, FREET4, T3FREE, THYROIDAB in the last 72 hours. Anemia Panel: No results for input(s): VITAMINB12, FOLATE, FERRITIN, TIBC, IRON, RETICCTPCT in the last 72 hours. Sepsis Labs: No results for input(s): PROCALCITON, LATICACIDVEN in the last 168 hours.  Recent Results (from the past 240 hour(s))  Surgical PCR screen     Status: None   Collection Time: 10/14/17 12:39 PM  Result Value Ref Range Status   MRSA, PCR NEGATIVE NEGATIVE Final   Staphylococcus aureus NEGATIVE NEGATIVE Final    Comment: (NOTE) The Xpert SA Assay (FDA approved for NASAL specimens in patients 78 years of age and older), is one component of a comprehensive surveillance program. It is not intended to diagnose infection nor to guide or monitor treatment. Performed at [facility], [address], [place], [place] [address]          Radiology Studies: Dg Chest 2 View  Result Date: 10/19/2017 CLINICAL DATA:  Follow-up edema. EXAM: CHEST - 2 VIEW COMPARISON:  October 18, 2017 FINDINGS: Left-sided pleural effusion and underlying opacity is stable. Stable pacemaker. The heart, hila, mediastinum, lungs, and pleura are otherwise unremarkable. No overt edema. IMPRESSION: Stable left-sided pleural effusion with underlying opacity. No overt pulmonary edema. Electronically Signed   By: Gerome Sam III M.D   On: 10/19/2017 11:22   US Renal  Result Date:  10/19/2017 CLINICAL DATA:  Acute kidney injury. EXAM: RENAL / URINARY TRACT ULTRASOUND COMPLETE COMPARISON:  Abdomen and pelvis CT 06/15/2015 FINDINGS: Right Kidney: Length: 11.6 cm. Exophytic simple appearing cyst measures up to 11.5 cm which is similar to prior CT where it measured 10.7 cm in long axis. No hydronephrosis. Left Kidney: Length: 9.0 cm. Cortical thinning evident. 13 mm exophytic lesion in the interpolar region does not have ultrasound imaging features suggesting cyst. This may be the 7 mm exophytic lesion seen on the CT scan from 1.5 years ago. No hydronephrosis. Bladder: Appears normal for degree of bladder distention. IMPRESSION: 1. 13 mm exophytic lesion interpolar left kidney not suggestive of a cyst by ultrasound. This may be an exophytic lesion seen on the previous noncontrast CT, but interval enlargement is suggested. MRI without and with contrast could be used to further evaluate as clinically warranted. 2. No evidence for hydronephrosis in either kidney. Electronically Signed   By: Kennith Center  M.D.   On: 10/19/2017 16:58   Scheduled Meds: . aspirin EC  81 mg Oral Daily  . clopidogrel  75 mg Oral Daily  . insulin aspart  0-5 Units Subcutaneous QHS  . insulin aspart  0-9 Units Subcutaneous TID WC  . insulin glargine  10 Units Subcutaneous QHS  . patiromer  8.4 g Oral Daily  . sodium chloride flush  3 mL Intravenous Q12H  . traZODone  50 mg Oral QHS   Continuous Infusions: . sodium chloride    . heparin 1,300 Units/hr (10/20/17 0017)  .  sodium bicarbonate (isotonic) infusion in sterile water 100 mL/hr at 10/20/17 1140     LOS: 2 days    Time spent: > 35 minutes  Penny Pia, MD [facility] Pager [phone]  If 7PM-7AM, please contact night-coverage [email or web address] Password [email or web address] 10/20/2017, 1:30 PM

## 2017-10-20 NOTE — Progress Notes (Signed)
ANTICOAGULATION CONSULT NOTE Pharmacy Consult:  Heparin Indication: chest pain/ACS  Patient Measurements: Height: 5\' 10"  (177.8 cm) Weight: 241 lb 3.2 oz (109.4 kg) IBW/kg (Calculated) : 73 Heparin Dosing Weight: 96 kg  Labs: Recent Labs    10/18/17 1042 10/18/17 1303 10/18/17 1351 10/18/17 1845 10/18/17 2330 10/19/17 0643 10/20/17 0618  HGB 13.2  --   --   --   --  12.7* 12.4*  HCT 39.2  --   --   --   --  38.0* 37.3*  PLT 106*  --   --   --   --  120* 132*  HEPARINUNFRC  --   --   --   --  0.47 0.48 0.33  CREATININE 4.85*  --  4.75*  --   --  4.74* 5.15*  TROPONINI 27.01* 24.67*  --  19.64* 24.22*  --   --     Assessment: [name] presented with left arm/hand swelling.  Pharmacy consulted to initiate IV heparin for ACS.  Troponin 27.  Patient has AKI and thrombocytopenia.  No bleeding reported.  Today the heparin level is 0.33, remains therapeutic on IV heparin 1300 units/hr. PLTs are low, but has increased to 132k today.  Hgb low/ stable at 12.4. No bleeding noted.   Goal of Therapy:  Heparin level 0.3-0.7 units/ml Monitor platelets by anticoagulation protocol: Yes    Plan:  Continue heparin gtt at 1300 units/hr Daily heparin level and CBC   Ronald Lindsey, RPh Clinical Pharmacist Pager: [phone] [phone] [facility] #[id number] 10/20/2017 11:43 AM

## 2017-10-20 NOTE — Progress Notes (Signed)
[facility]; referral made as requested. Abelino DerrickB Gustaf Mccarter St. Joseph HospitalRN,MHA,BSN [id number]

## 2017-10-20 NOTE — Consult Note (Signed)
Consultation Note Date: 10/20/2017   Patient Name: Ronald Lindsey  DOB: 04-15-1925  MRN: [id number]  Age / Sex: 84 y.o., male  PCP: Renford Dills, MD Referring Physician: Penny Pia, MD  Reason for Consultation: Establishing goals of care and Psychosocial/spiritual support  HPI/Patient Profile: 84 y.o. male   admitted on 10/18/2017 with  with a past medical history significant diabetes, chronic kidney disease stage III, hypertension, second-degree Mobitz type I heart block status post pacemaker/March 19.  Discharge patient with complaints of left upper extremity swelling and generalized edema.  Left Upper extremity ultrasound negative for venous throbosis.  Admitted for treatment and stabilization.  Today BUN/115, Creatine 5.15 and  per Nephrology not a dialysis candidate   Patient and family face treatment option decisions, advanced directive decisions, end-of-life decisions and anticipatory care needs.   Clinical Assessment and Goals of Care:   This NP Lorinda Creed reviewed medical records, received report from team, assessed the patient and then meet at the patient's bedside along with his wife and caregiver/[name]  to discuss diagnosis, prognosis, GOC, EOL wishes disposition and options.  Concept of Hospice and Palliative Care were discussed  A  discussion was had today regarding advanced directives.  Concepts specific to code status, artifical feeding and hydration, continued IV antibiotics and rehospitalization was had.  The difference between a aggressive medical intervention path  and a palliative comfort care path for this patient at this time was had.  Values and goals of care important to patient and family were attempted to be elicited.    Patient and family understand the limited prognosis.  Main focus of care is comfort and dignity.  They are hopeful to discharge home with hospice services  for end-of-life care   Natural trajectory and expectations at EOL were discussed.  Questions and concerns addressed.   Family encouraged to call with questions or concerns.    PMT will continue to support holistically.   SUMMARY OF RECOMMENDATIONS    Code Status/Advance Care Planning:  DNR   No artificial feeding/hydration now or in the future  Understands he is not a dialysis candidate  Avoid rehospitalization   Symptom Management:   Pain/Dyspnea: Roxanol 5 mg po/sl every 3 hrs prn Anxiety: Ativan 0.5 mg po/sl every 4 hrs   Palliative Prophylaxis:   Aspiration, Bowel Regimen, Frequent Pain Assessment and Oral Care  Additional Recommendations (Limitations, Scope, Preferences):  Full Comfort Care  Psycho-social/Spiritual:   Desire for further Chaplaincy support:no- strong community church support  Additional Recommendations: Education on Hospice  Prognosis:   < 4 weeks  Discharge Planning: Home with Hospice      Primary Diagnoses: Present on Admission: . Diabetic retinopathy (HCC) . Elevated troponin . HTN (hypertension) . GERD (gastroesophageal reflux disease) . Mobitz (type) I (Wenckebach's) atrioventricular block . Acute renal failure superimposed on stage 3 chronic kidney disease (HCC) . Hyponatremia   I have reviewed the medical record, interviewed the patient and family, and examined the patient. The following aspects are pertinent.  Past Medical History:  Diagnosis Date  . Acute diastolic CHF (congestive heart failure), NYHA class 3 (HCC) 09/13/2017  . Age-related macular degeneration, wet, both eyes (HCC)   . Arthritis    "lower back; years ago" (10/14/2017)  . Cellulitis of right leg 11/06/2011  . CHF (congestive heart failure) (HCC) 09/13/2017  . Diabetic neuropathy (HCC)   . Diabetic retinopathy of both eyes (HCC)   . Gastroenteritis 11/06/2011  . GERD (gastroesophageal reflux disease)   . HTN (hypertension)   . Mobitz (type) I  (Wenckebach's) atrioventricular block 09/16/2017  . Pleural effusion 05/08/2015  . Presence of permanent cardiac pacemaker 10/14/2017  . Stage III chronic kidney disease (HCC)   . Type II diabetes mellitus (HCC)    Social History   Socioeconomic History  . Marital status: Married    Spouse name: Not on file  . Number of children: Not on file  . Years of education: Not on file  . Highest education level: Not on file  Occupational History  . Occupation: Retired  Engineer, productionocial Needs  . Financial resource strain: Not on file  . Food insecurity:    Worry: Not on file    Inability: Not on file  . Transportation needs:    Medical: Not on file    Non-medical: Not on file  Tobacco Use  . Smoking status: Never Smoker  . Smokeless tobacco: Never Used  Substance and Sexual Activity  . Alcohol use: No  . Drug use: No  . Sexual activity: Never  Lifestyle  . Physical activity:    Days per week: Not on file    Minutes per session: Not on file  . Stress: Not on file  Relationships  . Social connections:    Talks on phone: Not on file    Gets together: Not on file    Attends religious service: Not on file    Active member of club or organization: Not on file    Attends meetings of clubs or organizations: Not on file    Relationship status: Not on file  Other Topics Concern  . Not on file  Social History Narrative   [place] lives with [place] in [place], [place]   Family History  Problem Relation Age of Onset  . Cancer Mother   . Heart disease Father   . Obesity Sister   . CAD Neg Hx    Scheduled Meds: . aspirin EC  81 mg Oral Daily  . clopidogrel  75 mg Oral Daily  . insulin aspart  0-5 Units Subcutaneous QHS  . insulin aspart  0-9 Units Subcutaneous TID WC  . insulin glargine  10 Units Subcutaneous QHS  . patiromer  8.4 g Oral Daily  . sodium chloride flush  3 mL Intravenous Q12H  . traZODone  50 mg Oral QHS   Continuous Infusions: . sodium chloride    . heparin 1,300 Units/hr (10/20/17  0017)  .  sodium bicarbonate (isotonic) infusion in sterile water 100 mL/hr at 10/20/17 1140   PRN Meds:.sodium chloride, acetaminophen, ondansetron (ZOFRAN) IV, sodium chloride flush Medications Prior to Admission:  Prior to Admission medications   Medication Sig Start Date End Date Taking? Authorizing Provider  acetaminophen (TYLENOL) 325 MG tablet Take 2 tablets (650 mg total) by mouth every 6 (six) hours as needed for mild pain or headache. 09/23/17  Yes Albertine GratesXu, Fang, MD  aspirin EC 81 MG tablet Take 81 mg by mouth daily.   Yes [provider]  hydrocerin (EUCERIN) CREA Apply 1 application topically 2 (  two) times a week. 09/23/17  Yes Albertine Grates, MD  insulin aspart (NOVOLOG) 100 UNIT/ML injection Insulin sliding scale: Blood sugar  120-150   3units                       151-200   4units                       201-250   7units                       251- 300  11units                       301-350   15uints                       351-400   20units                       >400         call MD immediately 09/23/17  Yes Albertine Grates, MD  LANTUS SOLOSTAR 100 UNIT/ML Solostar Pen Inject 15 Units into the skin daily at 10 pm. 09/23/17  Yes Albertine Grates, MD  lisinopril (PRINIVIL,ZESTRIL) 5 MG tablet Take 1 tablet (5 mg total) by mouth daily. 09/24/17  Yes Albertine Grates, MD  Multiple Vitamins-Minerals (PRESERVISION AREDS 2 PO) Take 1 tablet by mouth 2 (two) times daily.   Yes [provider]  torsemide (DEMADEX) 20 MG tablet Take 1 tablet (20 mg total) by mouth every other day. 10/13/17  Yes Rosalio Macadamia, NP  traZODone (DESYREL) 50 MG tablet Take 1 tablet (50 mg total) by mouth at bedtime as needed for sleep. Patient taking differently: Take 50 mg by mouth at bedtime.  09/23/17  Yes Albertine Grates, MD   No Known Allergies Review of Systems  Constitutional: Positive for fatigue.  Respiratory: Positive for shortness of breath.   Neurological: Positive for weakness.    Physical Exam  Constitutional: He  appears well-developed. He appears ill.  Cardiovascular: Normal rate, regular rhythm and normal heart sounds.  Pulmonary/Chest: He has decreased breath sounds.  Neurological: He is alert.  Skin: Skin is warm and dry.    Vital Signs: BP (!) 107/50 (BP Location: Left Arm)   Pulse 65   Temp 97.6 F (36.4 C) (Oral)   Resp 20   Ht 5\' 10"  (1.778 m)   Wt 109.4 kg (241 lb 3.2 oz)   SpO2 99%   BMI 34.61 kg/m  Pain Scale: 0-10   Pain Score: 0-No pain   SpO2: SpO2: 99 % O2 Device:SpO2: 99 % O2 Flow Rate: .   IO: Intake/output summary:   Intake/Output Summary (Last 24 hours) at 10/20/2017 1235 Last data filed at 10/20/2017 1610 Gross per 24 hour  Intake 1309.92 ml  Output 300 ml  Net 1009.92 ml    LBM: Last BM Date: 10/15/17 Baseline Weight: Weight: 107.5 kg (237 lb) Most recent weight: Weight: 109.4 kg (241 lb 3.2 oz)     Palliative Assessment/Data: 30 %   Discussed with Dr Cena Benton  Time In: 1145 Time Out: 1300 Time Total: 75 minutes Greater than 50%  of this time was spent counseling and coordinating care related to the above assessment and plan.  Signed by: Lorinda Creed, NP   Please contact Palliative Medicine Team phone at [phone] for questions and concerns.  For individual  provider: See Shea Evans

## 2017-10-20 NOTE — Telephone Encounter (Signed)
Pt currently in the hospital 

## 2017-10-20 NOTE — Progress Notes (Signed)
Progress Note  Patient Name: Ronald Lindsey Date of Encounter: 10/20/2017  Primary Cardiologist: Charlton HawsPeter Kaityln Kallstrom, MD   Subjective   No chest pain Trouble eating on his own due to vision  Inpatient Medications    Scheduled Meds: . aspirin EC  81 mg Oral Daily  . insulin aspart  0-5 Units Subcutaneous QHS  . insulin aspart  0-9 Units Subcutaneous TID WC  . insulin glargine  10 Units Subcutaneous QHS  . patiromer  8.4 g Oral Daily  . sodium chloride flush  3 mL Intravenous Q12H  . traZODone  50 mg Oral QHS   Continuous Infusions: . sodium chloride    . heparin 1,300 Units/hr (10/20/17 0017)   PRN Meds: sodium chloride, acetaminophen, ondansetron (ZOFRAN) IV, sodium chloride flush   Vital Signs    Vitals:   10/19/17 0511 10/19/17 1259 10/19/17 2033 10/20/17 0500  BP: (!) 101/49 (!) 97/39 (!) 99/46 (!) 94/42  Pulse: 73 66 73 69  Resp: 18 18 20 20   Temp: (!) 97.5 F (36.4 C) 97.6 F (36.4 C) 97.8 F (36.6 C) 97.7 F (36.5 C)  TempSrc: Oral Oral Oral Oral  SpO2: 98% 100% 97% 96%  Weight:    241 lb 3.2 oz (109.4 kg)  Height:        Intake/Output Summary (Last 24 hours) at 10/20/2017 0847 Last data filed at 10/20/2017 0252 Gross per 24 hour  Intake 1359.92 ml  Output 200 ml  Net 1159.92 ml   Filed Weights   10/18/17 1513 10/19/17 0333 10/20/17 0500  Weight: 240 lb 8 oz (109.1 kg) 241 lb 1.6 oz (109.4 kg) 241 lb 3.2 oz (109.4 kg)   Telemetry    Paced rhythm - Personally Reviewed  ECG    A-sensed, V paced rhythm- Personally Reviewed  Physical Exam  BP (!) 94/42 (BP Location: Left Arm)   Pulse 69   Temp 97.7 F (36.5 C) (Oral)   Resp 20   Ht 5\' 10"  (1.778 m)   Wt 241 lb 3.2 oz (109.4 kg)   SpO2 96%   BMI 34.61 kg/m   Affect appropriate Chronically ill male  HEENT: blind  Neck supple with no adenopathy JVP normal no bruits no thyromegaly Lungs clear with no wheezing and good diaphragmatic motion Heart:  S1/S2 no murmur, no rub, gallop or click PMI  normal pacer under left clavicle  Abdomen: benighn, BS positve, no tenderness, no AAA no bruit.  No HSM or HJR Distal pulses intact with no bruits No edema Neuro non-focal Skin warm and dry No muscular weakness   Labs    Chemistry Recent Labs  Lab 10/18/17 1351 10/19/17 0643 10/20/17 0618  NA 128* 129* 128*  K 5.2* 6.0* 5.8*  CL 98* 99* 98*  CO2 20* 20* 19*  GLUCOSE 151* 176* 150*  BUN 100* 106* 115*  CREATININE 4.75* 4.74* 5.15*  CALCIUM 8.1* 8.1* 8.3*  GFRNONAA 10* 10* 9*  GFRAA 11* 11* 10*  ANIONGAP 10 10 11      Hematology Recent Labs  Lab 10/18/17 1042 10/19/17 0643 10/20/17 0618  WBC 5.7 5.1 6.7  RBC 3.86* 3.74* 3.72*  HGB 13.2 12.7* 12.4*  HCT 39.2 38.0* 37.3*  MCV 101.6* 101.6* 100.3*  MCH 34.2* 34.0 33.3  MCHC 33.7 33.4 33.2  RDW 13.9 14.2 14.2  PLT 106* 120* 132*    Cardiac Enzymes Recent Labs  Lab 10/18/17 1042 10/18/17 1303 10/18/17 1845 10/18/17 2330  TROPONINI 27.01* 24.67* 19.64* 24.22*   No  results for input(s): TROPIPOC in the last 168 hours.   BNPNo results for input(s): BNP, PROBNP in the last 168 hours.   DDimer No results for input(s): DDIMER in the last 168 hours.   Radiology    Dg Chest 2 View  Result Date: 10/19/2017 CLINICAL DATA:  Follow-up edema. EXAM: CHEST - 2 VIEW COMPARISON:  October 18, 2017 FINDINGS: Left-sided pleural effusion and underlying opacity is stable. Stable pacemaker. The heart, hila, mediastinum, lungs, and pleura are otherwise unremarkable. No overt edema. IMPRESSION: Stable left-sided pleural effusion with underlying opacity. No overt pulmonary edema. Electronically Signed   By: Gerome Sam III M.D   On: 10/19/2017 11:22   Dg Chest 2 View  Result Date: 10/18/2017 CLINICAL DATA:  Fluid retention, recent pacemaker placement 4 days ago EXAM: CHEST - 2 VIEW COMPARISON:  10/15/2017 FINDINGS: LEFT subclavian transvenous sequential pacemaker with leads projecting at RIGHT atrium and RIGHT ventricle  unchanged. Enlargement of cardiac silhouette. Mediastinal contours stable with atherosclerotic calcification of aorta noted. Scattered interstitial infiltrates at mid to lower lungs with associated basilar pleural effusions and atelectasis, greater on LEFT, question CHF. Upper lungs clear. No pneumothorax. Bones demineralized. IMPRESSION: Persistent mild pulmonary edema and bibasilar effusions/atelectasis greater on LEFT. Electronically Signed   By: Ulyses Southward M.D.   On: 10/18/2017 11:59   US Renal  Result Date: 10/19/2017 CLINICAL DATA:  Acute kidney injury. EXAM: RENAL / URINARY TRACT ULTRASOUND COMPLETE COMPARISON:  Abdomen and pelvis CT 06/15/2015 FINDINGS: Right Kidney: Length: 11.6 cm. Exophytic simple appearing cyst measures up to 11.5 cm which is similar to prior CT where it measured 10.7 cm in long axis. No hydronephrosis. Left Kidney: Length: 9.0 cm. Cortical thinning evident. 13 mm exophytic lesion in the interpolar region does not have ultrasound imaging features suggesting cyst. This may be the 7 mm exophytic lesion seen on the CT scan from 1.5 years ago. No hydronephrosis. Bladder: Appears normal for degree of bladder distention. IMPRESSION: 1. 13 mm exophytic lesion interpolar left kidney not suggestive of a cyst by ultrasound. This may be an exophytic lesion seen on the previous noncontrast CT, but interval enlargement is suggested. MRI without and with contrast could be used to further evaluate as clinically warranted. 2. No evidence for hydronephrosis in either kidney. Electronically Signed   By: Kennith Center M.D.   On: 10/19/2017 16:58   Cardiac Studies     Patient Profile     84 y.o. male   Assessment & Plan    1. SEMI:  Never had chest pain Echo  EF 25-30% marked change from February suggesting Silent inferior MI. He is 84 yo with acute on chronic renal failure and has declined dialysis . Would Agree with palliative care and conservative medial RX Add Plavix. Stop heparin  tomorrow 2. PPM:  Normal function pacing masks any evidence of infarct on ECG   For questions or updates, please contact [facility] Please consult [email or web address] for contact info under Cardiology/STEMI.     Signed, Charlton Haws, MD  10/20/2017, 8:47 AM

## 2017-10-21 DIAGNOSIS — Z515 Encounter for palliative care: Secondary | ICD-10-CM

## 2017-10-21 DIAGNOSIS — R531 Weakness: Secondary | ICD-10-CM

## 2017-10-21 LAB — BASIC METABOLIC PANEL
ANION GAP: 13 (ref 5–15)
BUN: 124 mg/dL — ABNORMAL HIGH (ref 6–20)
CALCIUM: 8 mg/dL — AB (ref 8.9–10.3)
CHLORIDE: 94 mmol/L — AB (ref 101–111)
CO2: 21 mmol/L — AB (ref 22–32)
CREATININE: 5.59 mg/dL — AB (ref 0.61–1.24)
GFR calc Af Amer: 9 mL/min — ABNORMAL LOW (ref >60)
GFR calc non Af Amer: 8 mL/min — ABNORMAL LOW (ref >60)
GLUCOSE: 111 mg/dL — AB (ref 65–99)
Potassium: 5.8 mmol/L — ABNORMAL HIGH (ref 3.5–5.1)
Sodium: 128 mmol/L — ABNORMAL LOW (ref 135–145)

## 2017-10-21 LAB — CBC
HCT: 35.1 % — ABNORMAL LOW (ref 39.0–52.0)
HEMOGLOBIN: 11.8 g/dL — AB (ref 13.0–17.0)
MCH: 34 pg (ref 26.0–34.0)
MCHC: 33.6 g/dL (ref 30.0–36.0)
MCV: 101.2 fL — AB (ref 78.0–100.0)
Platelets: 128 10*3/uL — ABNORMAL LOW (ref 150–400)
RBC: 3.47 MIL/uL — ABNORMAL LOW (ref 4.22–5.81)
RDW: 14.4 % (ref 11.5–15.5)
WBC: 5.8 10*3/uL (ref 4.0–10.5)

## 2017-10-21 LAB — HEPARIN LEVEL (UNFRACTIONATED): HEPARIN UNFRACTIONATED: 0.57 [IU]/mL (ref 0.30–0.70)

## 2017-10-21 LAB — GLUCOSE, CAPILLARY
Glucose-Capillary: 110 mg/dL — ABNORMAL HIGH (ref 65–99)
Glucose-Capillary: 108 mg/dL — ABNORMAL HIGH (ref 65–99)

## 2017-10-21 MED ORDER — CLOPIDOGREL BISULFATE 75 MG PO TABS: 75.0000 mg | ORAL_TABLET | Freq: Every day | ORAL | 0 refills | Status: AC

## 2017-10-21 MED ORDER — LORAZEPAM 0.5 MG PO TABS: 0.5000 mg | ORAL_TABLET | ORAL | 0 refills | Status: AC | PRN

## 2017-10-21 MED ORDER — LANTUS SOLOSTAR 100 UNIT/ML ~~LOC~~ SOPN: 10.0000 [IU] | PEN_INJECTOR | Freq: Every day | SUBCUTANEOUS | 0 refills | Status: AC

## 2017-10-21 MED ORDER — MORPHINE SULFATE (CONCENTRATE) 10 MG/0.5ML PO SOLN: 5.0000 mg | ORAL | Status: AC | PRN

## 2017-10-21 NOTE — Progress Notes (Signed)
PROGRESS NOTE    Ronald Lindsey  ZOX:[id number] DOB: 1925-04-30 DOA: 10/18/2017 PCP: Renford DillsPolite, Ronald, MD    Brief Narrative:   84 y.o. male with a Past Medical History of diabetes, chronic kidney disease, arrhythmia, hypertension who presents with arm swelling and weight gain  Assessment & Plan:   Principal Problem:   Acute on chronic heart failure [facility](HCC) - Nephrology and Cardiology managing currently. - Both specialist recommending Palliative Care team. Palliative on board and plan is for home with hospice.  STEMI - Pt never had chest pain has Echo reporting EF of 25-30% which is change from last evaluation on February.   Active Problems:   Diabetic retinopathy (HCC) - stable    HTN (hypertension) - stable curretly    GERD (gastroesophageal reflux disease) - stable    DM (diabetes mellitus) (HCC) - Pt on Lantus and SSI    Mobitz (type) I (Wenckebach's) atrioventricular block - cardiology on board. Pt appearts to have normal PM function.    Elevated troponin/  Non-ST elevation (NSTEMI) myocardial infarction (HCC) - No plans for invasive evaluation in asymptomatic 90 per cardiology - cards to start heparin    Acute renal failure superimposed on stage 3 chronic kidney disease (HCC), Pt has acute on chronic renal failure and patient has declined dialysis. - Nephrology states that patient is not candidate for dialysis.   DVT prophylaxis: heparin infusion Code Status: DNR Family Communication: none at bedside. Disposition Plan: Pt to transition home with hospice. Awaiting for equiptment to get to home.    Consultants:   Cardiology  Nephrology   Procedures: none   Antimicrobials: none   Subjective: Pt reports no new problems currently.  Objective: Vitals:   10/20/17 1951 10/21/17 0025 10/21/17 0420 10/21/17 1117  BP: (!) 98/45 (!) 102/49 (!) 96/48 (!) 97/52  Pulse: 72 64 64 70  Resp: 18 18 20    Temp: 98.4 F (36.9 C) 98.2 F (36.8 C) 97.7 F (36.5 C)  (!) 97.3 F (36.3 C)  TempSrc: Oral Oral Oral Oral  SpO2: 97% 95% 95% 96%  Weight:   110.5 kg (243 lb 9.6 oz)   Height:        Intake/Output Summary (Last 24 hours) at 10/21/2017 1309 Last data filed at 10/21/2017 0810 Gross per 24 hour  Intake 2001.33 ml  Output 700 ml  Net 1301.33 ml   Filed Weights   10/19/17 0333 10/20/17 0500 10/21/17 0420  Weight: 109.4 kg (241 lb 1.6 oz) 109.4 kg (241 lb 3.2 oz) 110.5 kg (243 lb 9.6 oz)    Examination: Exam unchanged when compared to prior.  General exam: Appears calm and comfortable, in nad.  Respiratory system: Clear to auscultation. Respiratory effort normal. Equal chest rise.  Cardiovascular system: S1 & S2 heard, RRR. No JVD, murmurs, rubs, gallops or clicks. No pedal edema. Gastrointestinal system: Abdomen is nondistended, soft and nontender. No organomegaly or masses felt. Normal bowel sounds heard. Central nervous system: alert and awake. No facial asymmetry  Data Reviewed: I have personally reviewed following labs and imaging studies  CBC: Recent Labs  Lab 10/18/17 1042 10/19/17 0643 10/20/17 0618 10/21/17 0604  WBC 5.7 5.1 6.7 5.8  NEUTROABS 3.7  --   --   --   HGB 13.2 12.7* 12.4* 11.8*  HCT 39.2 38.0* 37.3* 35.1*  MCV 101.6* 101.6* 100.3* 101.2*  PLT 106* 120* 132* 128*   Basic Metabolic Panel: Recent Labs  Lab 10/18/17 1042 10/18/17 1351 10/19/17 [id number] 10/20/17 0618 10/21/17 [id number]  NA 128* 128* 129* 128* 128*  K 5.3* 5.2* 6.0* 5.8* 5.8*  CL 98* 98* 99* 98* 94*  CO2 18* 20* 20* 19* 21*  GLUCOSE 120* 151* 176* 150* 111*  BUN 102* 100* 106* 115* 124*  CREATININE 4.85* 4.75* 4.74* 5.15* 5.59*  CALCIUM 8.1* 8.1* 8.1* 8.3* 8.0*   GFR: Estimated Creatinine Clearance: 10.9 mL/min (A) (by C-G formula based on SCr of 5.59 mg/dL (H)). Liver Function Tests: No results for input(s): AST, ALT, ALKPHOS, BILITOT, PROT, ALBUMIN in the last 168 hours. No results for input(s): LIPASE, AMYLASE in the last 168 hours. No  results for input(s): AMMONIA in the last 168 hours. Coagulation Profile: No results for input(s): INR, PROTIME in the last 168 hours. Cardiac Enzymes: Recent Labs  Lab 10/18/17 1042 10/18/17 1303 10/18/17 1845 10/18/17 2330  TROPONINI 27.01* 24.67* 19.64* 24.22*   BNP (last 3 results) Recent Labs    09/29/17 1044  PROBNP 2,151*   HbA1C: Recent Labs    10/18/17 1351  HGBA1C 6.2*   CBG: Recent Labs  Lab 10/20/17 1228 10/20/17 1626 10/20/17 1955 10/21/17 0728 10/21/17 1116  GLUCAP 137* 140* 125* 110* 108*   Lipid Profile: No results for input(s): CHOL, HDL, LDLCALC, TRIG, CHOLHDL, LDLDIRECT in the last 72 hours. Thyroid Function Tests: No results for input(s): TSH, T4TOTAL, FREET4, T3FREE, THYROIDAB in the last 72 hours. Anemia Panel: No results for input(s): VITAMINB12, FOLATE, FERRITIN, TIBC, IRON, RETICCTPCT in the last 72 hours. Sepsis Labs: No results for input(s): PROCALCITON, LATICACIDVEN in the last 168 hours.  Recent Results (from the past 240 hour(s))  Surgical PCR screen     Status: None   Collection Time: 10/14/17 12:39 PM  Result Value Ref Range Status   MRSA, PCR NEGATIVE NEGATIVE Final   Staphylococcus aureus NEGATIVE NEGATIVE Final    Comment: (NOTE) The Xpert SA Assay (FDA approved for NASAL specimens in patients 104 years of age and older), is one component of a comprehensive surveillance program. It is not intended to diagnose infection nor to guide or monitor treatment. Performed at [facility], [address], [place], [place] [address]          Radiology Studies: No results found. Scheduled Meds: . aspirin EC  81 mg Oral Daily  . clopidogrel  75 mg Oral Daily  . insulin aspart  0-5 Units Subcutaneous QHS  . insulin aspart  0-9 Units Subcutaneous TID WC  . insulin glargine  10 Units Subcutaneous QHS  . sodium chloride flush  3 mL Intravenous Q12H  . traZODone  50 mg Oral QHS   Continuous Infusions: . sodium chloride     . heparin 1,300 Units/hr (10/20/17 2144)  .  sodium bicarbonate (isotonic) infusion in sterile water 100 mL/hr at 10/21/17 1031     LOS: 3 days    Time spent: > 35 minutes  Penny Pia, MD [facility] Pager [phone]  If 7PM-7AM, please contact night-coverage [email or web address] Password [facility] 10/21/2017, 1:09 PM

## 2017-10-21 NOTE — Progress Notes (Signed)
Received call from spouse, hospital bed, over bed table has been delivered to the home; home address verified; patient is to be transported home via non emergent ambulance; [facility] ambulance called; ambulance paperwork placed on the shadow chart. Abelino DerrickB Janayah Zavada [facility] AlexandriaRN,MHA,BSN [phone]

## 2017-10-21 NOTE — Progress Notes (Signed)
ANTICOAGULATION CONSULT NOTE Pharmacy Consult:  Heparin Indication: chest pain/ACS  Patient Measurements: Height: 5\' 10"  (177.8 cm) Weight: 243 lb 9.6 oz (110.5 kg) IBW/kg (Calculated) : 73 Heparin Dosing Weight: 96 kg  Labs: Recent Labs    10/18/17 1303  10/18/17 1845  10/18/17 2330 10/19/17 0643 10/20/17 0618 10/21/17 0604  HGB  --   --   --   --   --  12.7* 12.4* 11.8*  HCT  --   --   --   --   --  38.0* 37.3* 35.1*  PLT  --   --   --   --   --  120* 132* 128*  HEPARINUNFRC  --   --   --    < > 0.47 0.48 0.33 0.57  CREATININE  --    < >  --   --   --  4.74* 5.15* 5.59*  TROPONINI 24.67*  --  19.64*  --  24.22*  --   --   --    < > = values in this interval not displayed.    Assessment: [name] presented with left arm/hand swelling.  Pharmacy consulted to initiate IV heparin for ACS.  Troponin 27.  Patient has AKI and thrombocytopenia.  No bleeding reported.  Today the heparin level is 0.57, remains therapeutic on IV heparin 1300 units/hr. PLTs were low on admit at 106k, now pltc 128k.  Hgb low, decreased to 11.8. No bleeding noted.   Goal of Therapy:  Heparin level 0.3-0.7 units/ml Monitor platelets by anticoagulation protocol: Yes    Plan:  Continue heparin gtt at 1300 units/hr Daily heparin level and CBC   Ronald Lindsey, RPh Clinical Pharmacist Pager: [phone]-[phone] Main Pharmacy [phone] 10/21/2017 9:55 AM

## 2017-10-21 NOTE — Progress Notes (Signed)
Patient given discharge instructions. Paperwork placed in belongings bag for wife to review at home. IV and telemetry removed. Prescriptions also placed in belongings bag. [facility] at bedside to transport to home.

## 2017-10-21 NOTE — Discharge Summary (Signed)
Physician Discharge Summary  Ronald Lindsey ZOX:[id number] DOB: 01-Mar-1925 DOA: 10/18/2017  PCP: Renford Dills, MD  Admit date: 10/18/2017 Discharge date: 10/21/2017  Time spent: > 35 minutes  Recommendations for Outpatient Follow-up:  Continue comfort care measures.   Discharge Diagnoses:  Principal Problem:   Acute on chronic heart failure (HCC) Active Problems:   Diabetic retinopathy (HCC)   HTN (hypertension)   GERD (gastroesophageal reflux disease)   DM (diabetes mellitus) (HCC)   Mobitz (type) I (Wenckebach's) atrioventricular block   Elevated troponin   Acute renal failure superimposed on stage 3 chronic kidney disease (HCC)   Hyponatremia   AKI (acute kidney injury) (HCC)   Non-ST elevation (NSTEMI) myocardial infarction [facility])   Discharge Condition: stable  Diet recommendation: heart healthy/diabetic diet  Filed Weights   10/19/17 0333 10/20/17 0500 10/21/17 0420  Weight: 109.4 kg (241 lb 1.6 oz) 109.4 kg (241 lb 3.2 oz) 110.5 kg (243 lb 9.6 oz)    History of present illness:  84 y.o.malewith a Past Medical History of diabetes, chronic kidney disease, arrhythmia, hypertension who presents with arm swelling and weight gain  Hospital Course:  Principal Problem:   Acute on chronic heart failure [facility]) - Nephrology and Cardiology managing currently. - Both specialist recommending Palliative Care team. Palliative on board and plan is for home with hospice.  STEMI - Pt never had chest pain has Echo reporting EF of 25-30% which is change from last evaluation on February.   Active Problems:   Diabetic retinopathy (HCC) - stable    HTN (hypertension) - stable curretly    GERD (gastroesophageal reflux disease) - stable    DM (diabetes mellitus) (HCC) - Pt on Lantus and diabetic diet.     Mobitz (type) I (Wenckebach's) atrioventricular block - Pt appearts to have normal PM function.    Elevated troponin/  Non-ST elevation (NSTEMI) myocardial  infarction (HCC) - No plans for invasive evaluation in asymptomatic 82 per cardiology - d/c heparin    Acute renal failure superimposed on stage 3 chronic kidney disease (HCC), Pt has acute on chronic renal failure and patient has declined dialysis. - Nephrology states that patient is not candidate for dialysis.  Procedures:  none  Consultations:  Nephrology  Cardiology  Discharge Exam: Vitals:   10/21/17 0420 10/21/17 1117  BP: (!) 96/48 (!) 97/52  Pulse: 64 70  Resp: 20   Temp: 97.7 F (36.5 C) (!) 97.3 F (36.3 C)  SpO2: 95% 96%    General: Pt in nad, alert and awake Cardiovascular: rrr, no rubs Respiratory: no increased wob, no wheezes  Discharge Instructions   Discharge Instructions    Diet - low sodium heart healthy   Complete by:  As directed    Discharge instructions   Complete by:  As directed    Discharge home and continue comfort care measures that will be managed by hospice team.   Increase activity slowly   Complete by:  As directed      Allergies as of 10/21/2017   No Known Allergies     Medication List    STOP taking these medications   acetaminophen 325 MG tablet Commonly known as:  TYLENOL   aspirin EC 81 MG tablet   hydrocerin Crea   insulin aspart 100 UNIT/ML injection Commonly known as:  novoLOG   lisinopril 5 MG tablet Commonly known as:  PRINIVIL,ZESTRIL   PRESERVISION AREDS 2 PO   torsemide 20 MG tablet Commonly known as:  DEMADEX   traZODone 50  MG tablet Commonly known as:  DESYREL     TAKE these medications   clopidogrel 75 MG tablet Commonly known as:  PLAVIX Take 1 tablet (75 mg total) by mouth daily. Start taking on:  10/22/2017   LANTUS SOLOSTAR 100 UNIT/ML Solostar Pen Generic drug:  Insulin Glargine Inject 10 Units into the skin daily at 10 pm. What changed:  how much to take   LORazepam 0.5 MG tablet Commonly known as:  ATIVAN Take 1 tablet (0.5 mg total) by mouth every 4 (four) hours as needed for  anxiety or sleep.   morphine CONCENTRATE 10 MG/0.5ML Soln concentrated solution Take 0.25 mLs (5 mg total) by mouth every 3 (three) hours as needed for moderate pain or shortness of breath.      No Known Allergies    The results of significant diagnostics from this hospitalization (including imaging, microbiology, ancillary and laboratory) are listed below for reference.    Significant Diagnostic Studies: Dg Chest 2 View  Result Date: 10/19/2017 CLINICAL DATA:  Follow-up edema. EXAM: CHEST - 2 VIEW COMPARISON:  October 18, 2017 FINDINGS: Left-sided pleural effusion and underlying opacity is stable. Stable pacemaker. The heart, hila, mediastinum, lungs, and pleura are otherwise unremarkable. No overt edema. IMPRESSION: Stable left-sided pleural effusion with underlying opacity. No overt pulmonary edema. Electronically Signed   By: Gerome Samavid  Williams III M.D   On: 10/19/2017 11:22   Dg Chest 2 View  Result Date: 10/18/2017 CLINICAL DATA:  Fluid retention, recent pacemaker placement 4 days ago EXAM: CHEST - 2 VIEW COMPARISON:  10/15/2017 FINDINGS: LEFT subclavian transvenous sequential pacemaker with leads projecting at RIGHT atrium and RIGHT ventricle unchanged. Enlargement of cardiac silhouette. Mediastinal contours stable with atherosclerotic calcification of aorta noted. Scattered interstitial infiltrates at mid to lower lungs with associated basilar pleural effusions and atelectasis, greater on LEFT, question CHF. Upper lungs clear. No pneumothorax. Bones demineralized. IMPRESSION: Persistent mild pulmonary edema and bibasilar effusions/atelectasis greater on LEFT. Electronically Signed   By: Ulyses SouthwardMark  Boles M.D.   On: 10/18/2017 11:59   Dg Chest 2 View  Result Date: 10/15/2017 CLINICAL DATA:  Pacemaker placement EXAM: CHEST - 2 VIEW COMPARISON:  09/13/2017 FINDINGS: Interval placement of dual lead pacemaker with leads in the right atrium and right ventricle in good position. No pneumothorax.  Pulmonary vascular congestion with interval improvement. Improvement in pulmonary edema since the prior study. Bilateral pleural effusions left greater than right unchanged. Progression of left lower lobe consolidation, likely atelectasis. Mild right lower lobe atelectasis also noted. IMPRESSION: Satisfactory transvenous pacemaker placement Improvement in congestive heart failure and edema since the prior study. Small bilateral effusions left greater than right Progressive left lower lobe consolidation most likely atelectasis. Electronically Signed   By: Marlan Palauharles  Clark M.D.   On: 10/15/2017 09:08   [place] Renal  Result Date: 10/19/2017 CLINICAL DATA:  Acute kidney injury. EXAM: RENAL / URINARY TRACT ULTRASOUND COMPLETE COMPARISON:  Abdomen and pelvis CT 06/15/2015 FINDINGS: Right Kidney: Length: 11.6 cm. Exophytic simple appearing cyst measures up to 11.5 cm which is similar to prior CT where it measured 10.7 cm in long axis. No hydronephrosis. Left Kidney: Length: 9.0 cm. Cortical thinning evident. 13 mm exophytic lesion in the interpolar region does not have ultrasound imaging features suggesting cyst. This may be the 7 mm exophytic lesion seen on the CT scan from 1.5 years ago. No hydronephrosis. Bladder: Appears normal for degree of bladder distention. IMPRESSION: 1. 13 mm exophytic lesion interpolar left kidney not suggestive of a cyst  by ultrasound. This may be an exophytic lesion seen on the previous noncontrast CT, but interval enlargement is suggested. MRI without and with contrast could be used to further evaluate as clinically warranted. 2. No evidence for hydronephrosis in either kidney. Electronically Signed   By: Kennith Center M.D.   On: 10/19/2017 16:58    Microbiology: Recent Results (from the past 240 hour(s))  Surgical PCR screen     Status: None   Collection Time: 10/14/17 12:39 PM  Result Value Ref Range Status   MRSA, PCR NEGATIVE NEGATIVE Final   Staphylococcus aureus NEGATIVE NEGATIVE  Final    Comment: (NOTE) The Xpert SA Assay (FDA approved for NASAL specimens in patients 24 years of age and older), is one component of a comprehensive surveillance program. It is not intended to diagnose infection nor to guide or monitor treatment. Performed at [facility], [address] [place], [place] [address]      Labs: Basic Metabolic Panel: Recent Labs  Lab 10/18/17 1042 10/18/17 1351 10/19/17 0643 10/20/17 0618 10/21/17 0604  NA 128* 128* 129* 128* 128*  K 5.3* 5.2* 6.0* 5.8* 5.8*  CL 98* 98* 99* 98* 94*  CO2 18* 20* 20* 19* 21*  GLUCOSE 120* 151* 176* 150* 111*  BUN 102* 100* 106* 115* 124*  CREATININE 4.85* 4.75* 4.74* 5.15* 5.59*  CALCIUM 8.1* 8.1* 8.1* 8.3* 8.0*   Liver Function Tests: No results for input(s): AST, ALT, ALKPHOS, BILITOT, PROT, ALBUMIN in the last 168 hours. No results for input(s): LIPASE, AMYLASE in the last 168 hours. No results for input(s): AMMONIA in the last 168 hours. CBC: Recent Labs  Lab 10/18/17 1042 10/19/17 0643 10/20/17 0618 10/21/17 0604  WBC 5.7 5.1 6.7 5.8  NEUTROABS 3.7  --   --   --   HGB 13.2 12.7* 12.4* 11.8*  HCT 39.2 38.0* 37.3* 35.1*  MCV 101.6* 101.6* 100.3* 101.2*  PLT 106* 120* 132* 128*   Cardiac Enzymes: Recent Labs  Lab 10/18/17 1042 10/18/17 1303 10/18/17 1845 10/18/17 2330  TROPONINI 27.01* 24.67* 19.64* 24.22*   BNP: BNP (last 3 results) Recent Labs    09/13/17 1622 09/22/17 0522  BNP 518.8* 488.5*    ProBNP (last 3 results) Recent Labs    09/29/17 1044  PROBNP 2,151*    CBG: Recent Labs  Lab 10/20/17 1228 10/20/17 1626 10/20/17 1955 10/21/17 0728 10/21/17 1116  GLUCAP 137* 140* 125* 110* 108*     Signed:  Penny Pia MD.  [facility] 10/21/2017, 2:26 PM

## 2017-10-21 NOTE — Progress Notes (Signed)
Patient ID: Ronald Lindsey, male   DOB: August 08, 1924, 84 y.o.   MRN: [id number]  This NP visited patient at the bedside as a follow up to  yesterday's GOCs meeting.  Patient appears comfortable he voices no complaints.  Returned call to [name]/caregiver and patient's wife regarding questions and concerns for anticipated discharge today, home with hospice.  We discussed the patient's goals of care for comfort, quality and dignity at home.  We again discussed the details of hospice benefit at home, and anticipated discharge today.    Natural trajectory and expectations at end of life discussed, questions and concerns addressed.  Discussed with family  the importance of continued conversation with their  medical providers regarding overall plan of care and treatment options,  ensuring decisions are within the context of the patients values and GOCs.  Time in 0950      Time out  1010  Total time spent on the unit was 20 minutes  Greater than 50% of the time was spent in counseling and coordination of care  [name] NP  Palliative Medicine Team Team Phone # [phone] Pager [phone]

## 2017-10-27 DEATH — deceased

## 2017-10-28 ENCOUNTER — Ambulatory Visit: Payer: Medicare Other

## 2017-10-28 ENCOUNTER — Other Ambulatory Visit: Payer: Medicare Other

## 2018-01-16 ENCOUNTER — Encounter: Payer: Self-pay | Admitting: Cardiology

## 2018-07-22 IMAGING — DX DG CHEST 2V
3 series · 3 of 3 positions shown · non-contrast
Comparison: 10/15/2017

CLINICAL DATA: Fluid retention, recent pacemaker placement 4 days
ago

EXAM:
CHEST - 2 VIEW

[chest lat (1 of 2)]
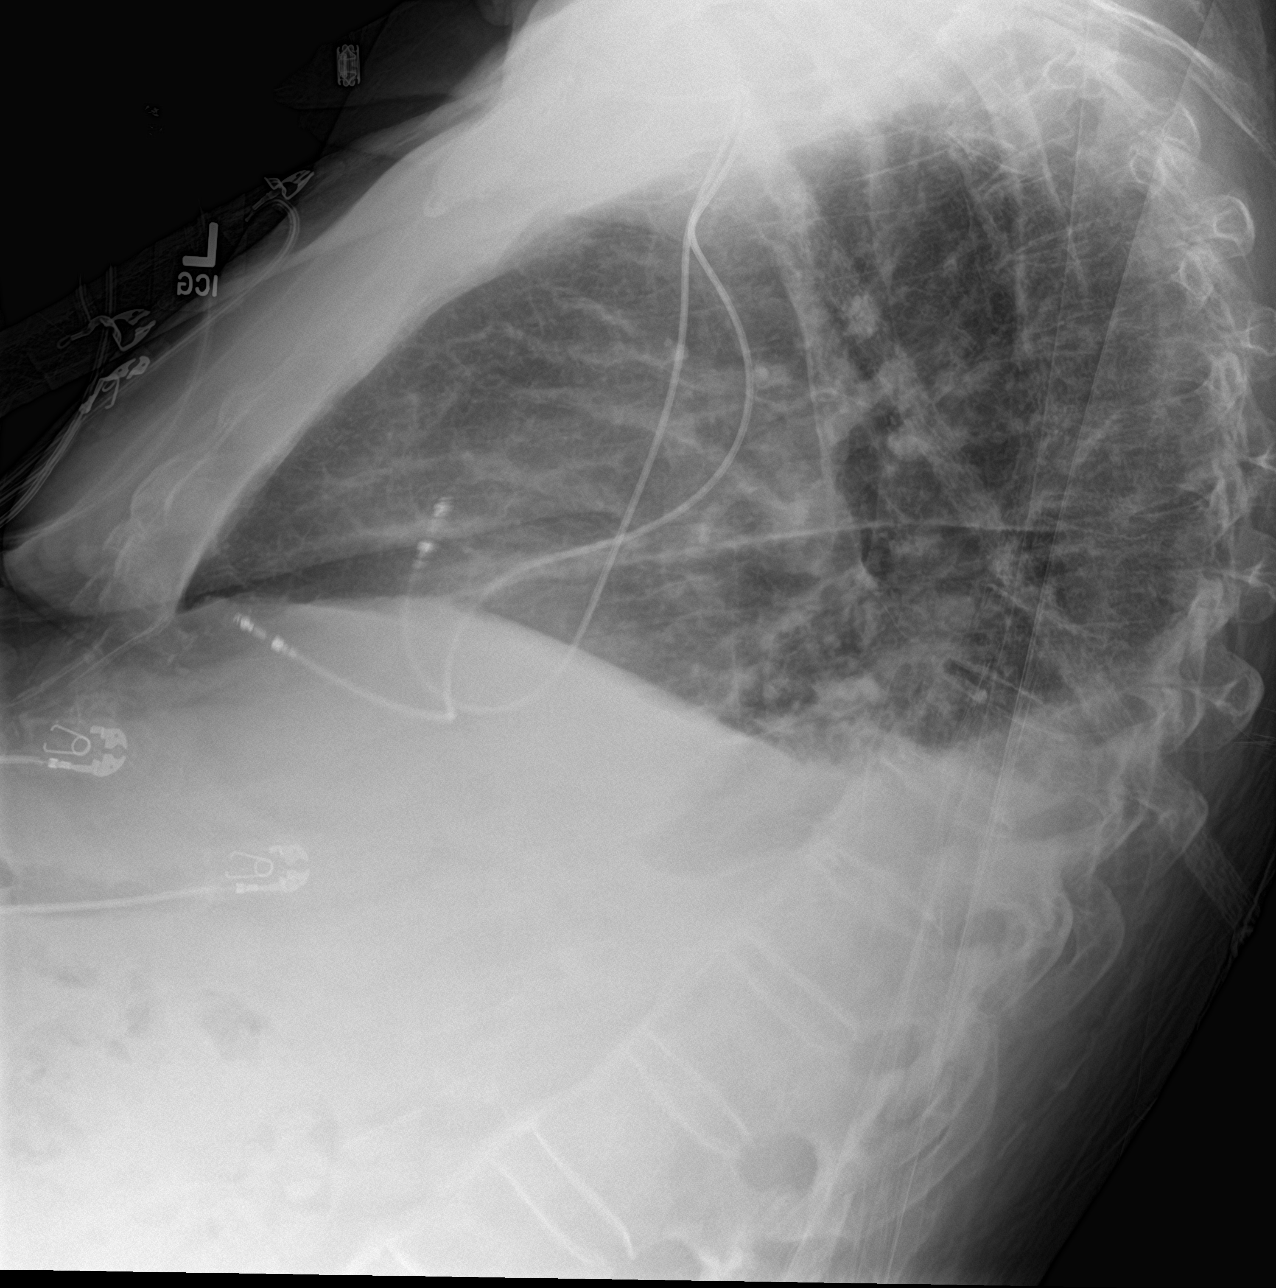

[chest ap]
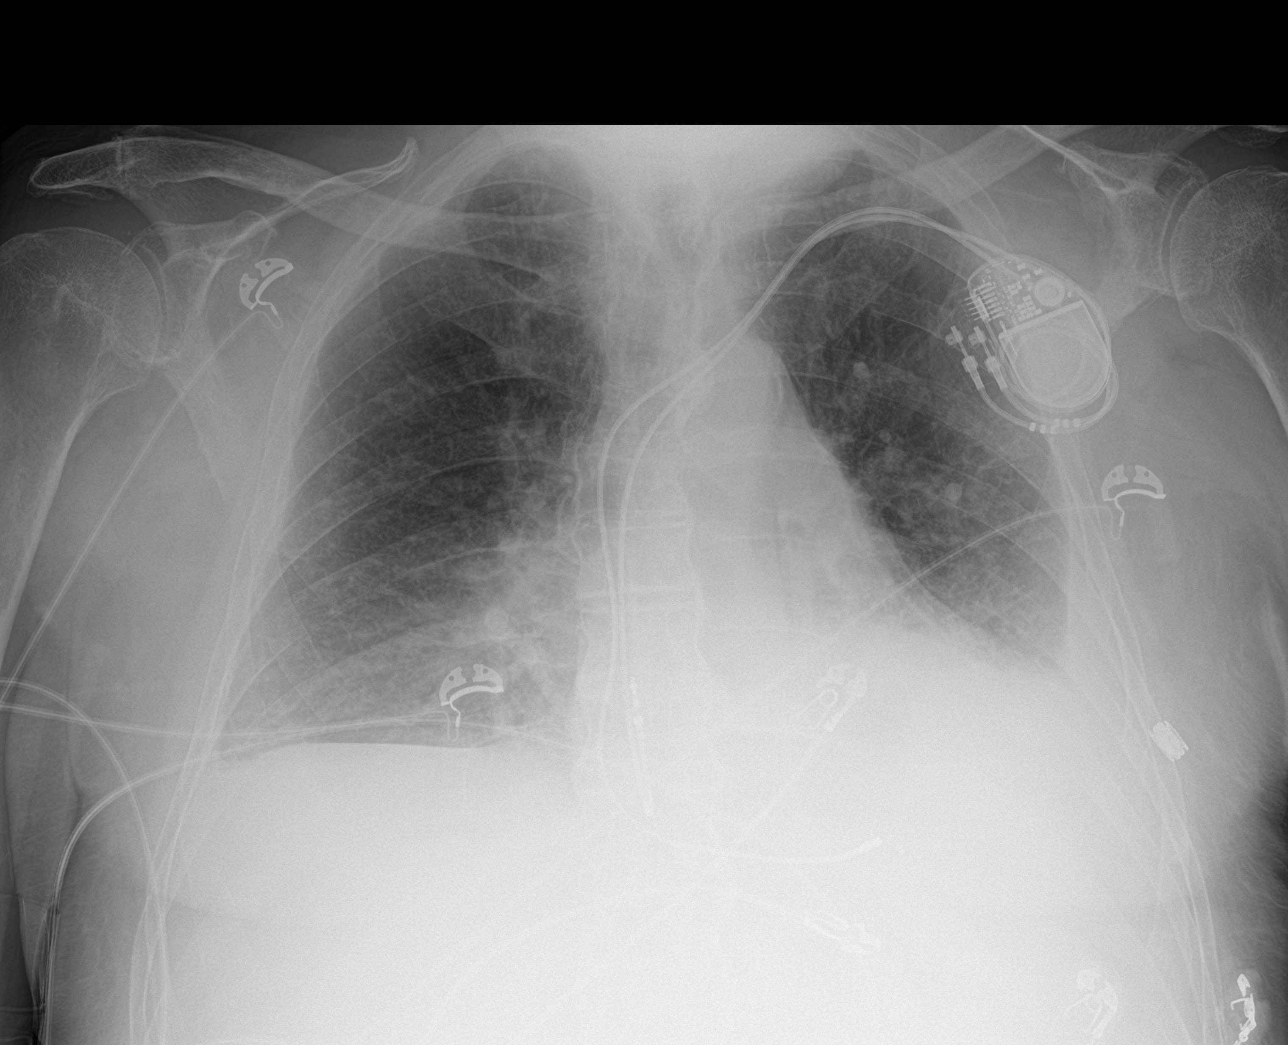

[chest lat (2 of 2)]
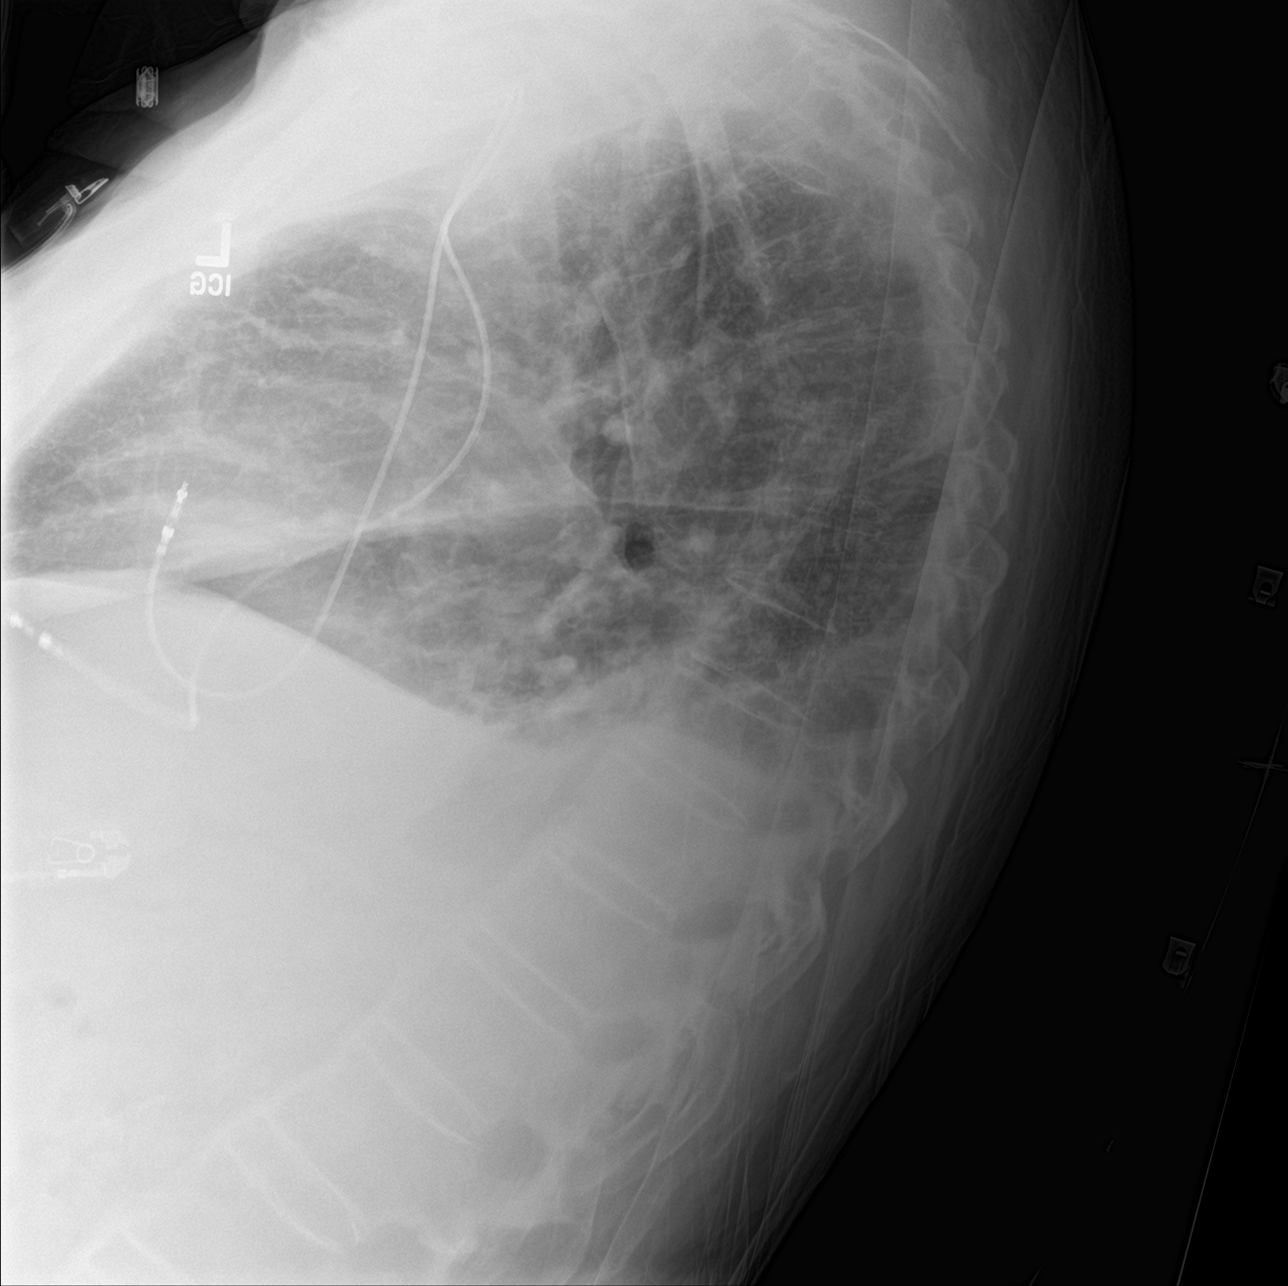

[3 of 3 positions shown; findings below may reference images not displayed]

FINDINGS: LEFT subclavian transvenous sequential pacemaker with leads
projecting at RIGHT atrium and RIGHT ventricle unchanged.

Enlargement of cardiac silhouette.

Mediastinal contours stable with atherosclerotic calcification of
aorta noted.

Scattered interstitial infiltrates at mid to lower lungs with
associated basilar pleural effusions and atelectasis, greater on
LEFT, question CHF.

Upper lungs clear.

No pneumothorax.

Bones demineralized.
IMPRESSION: Persistent mild pulmonary edema and bibasilar effusions/atelectasis
greater on LEFT.

## 2018-07-23 IMAGING — CR DG CHEST 2V
2 series · 2 of 2 positions shown · non-contrast
Comparison: October 18, 2017

CLINICAL DATA: Follow-up edema.

EXAM:
CHEST - 2 VIEW

[chest lat]
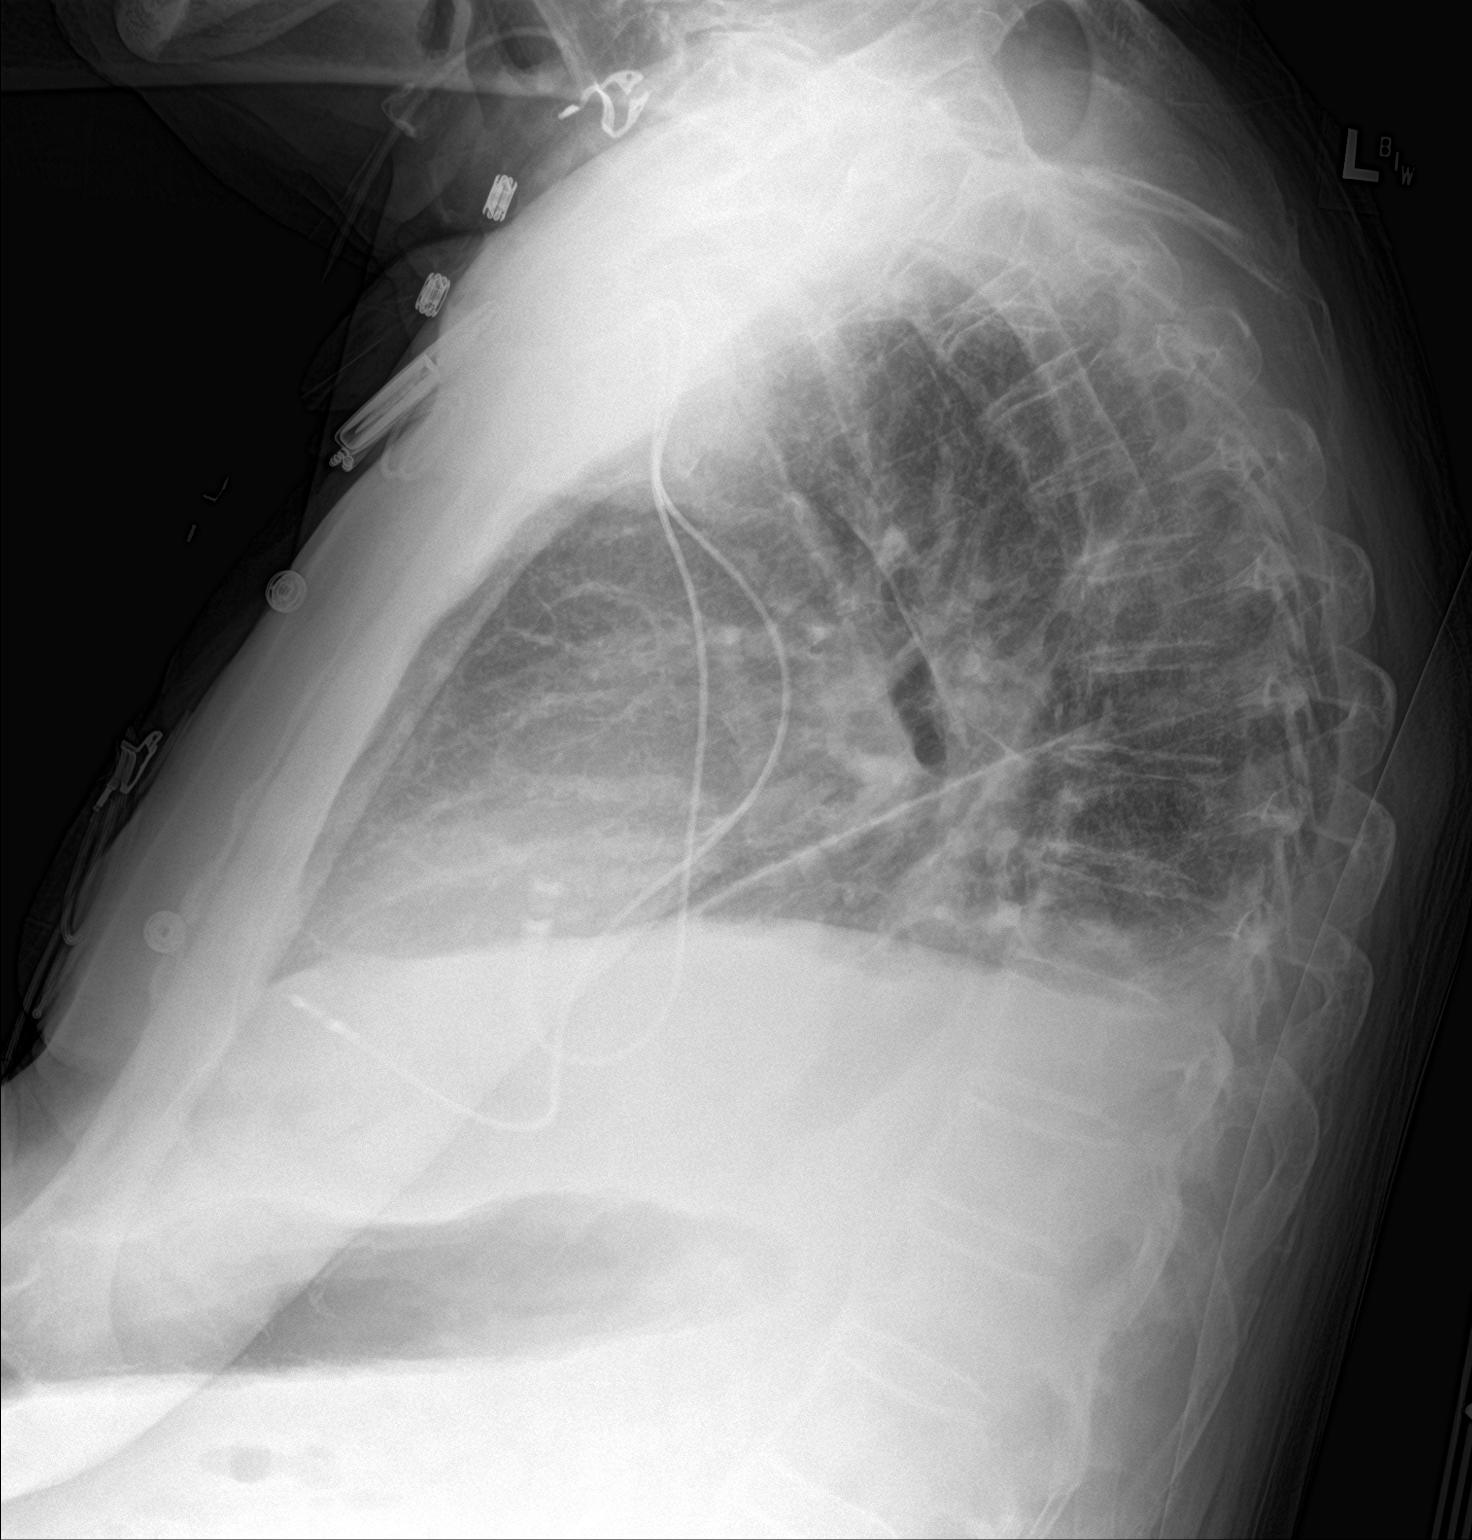

[chest ap]
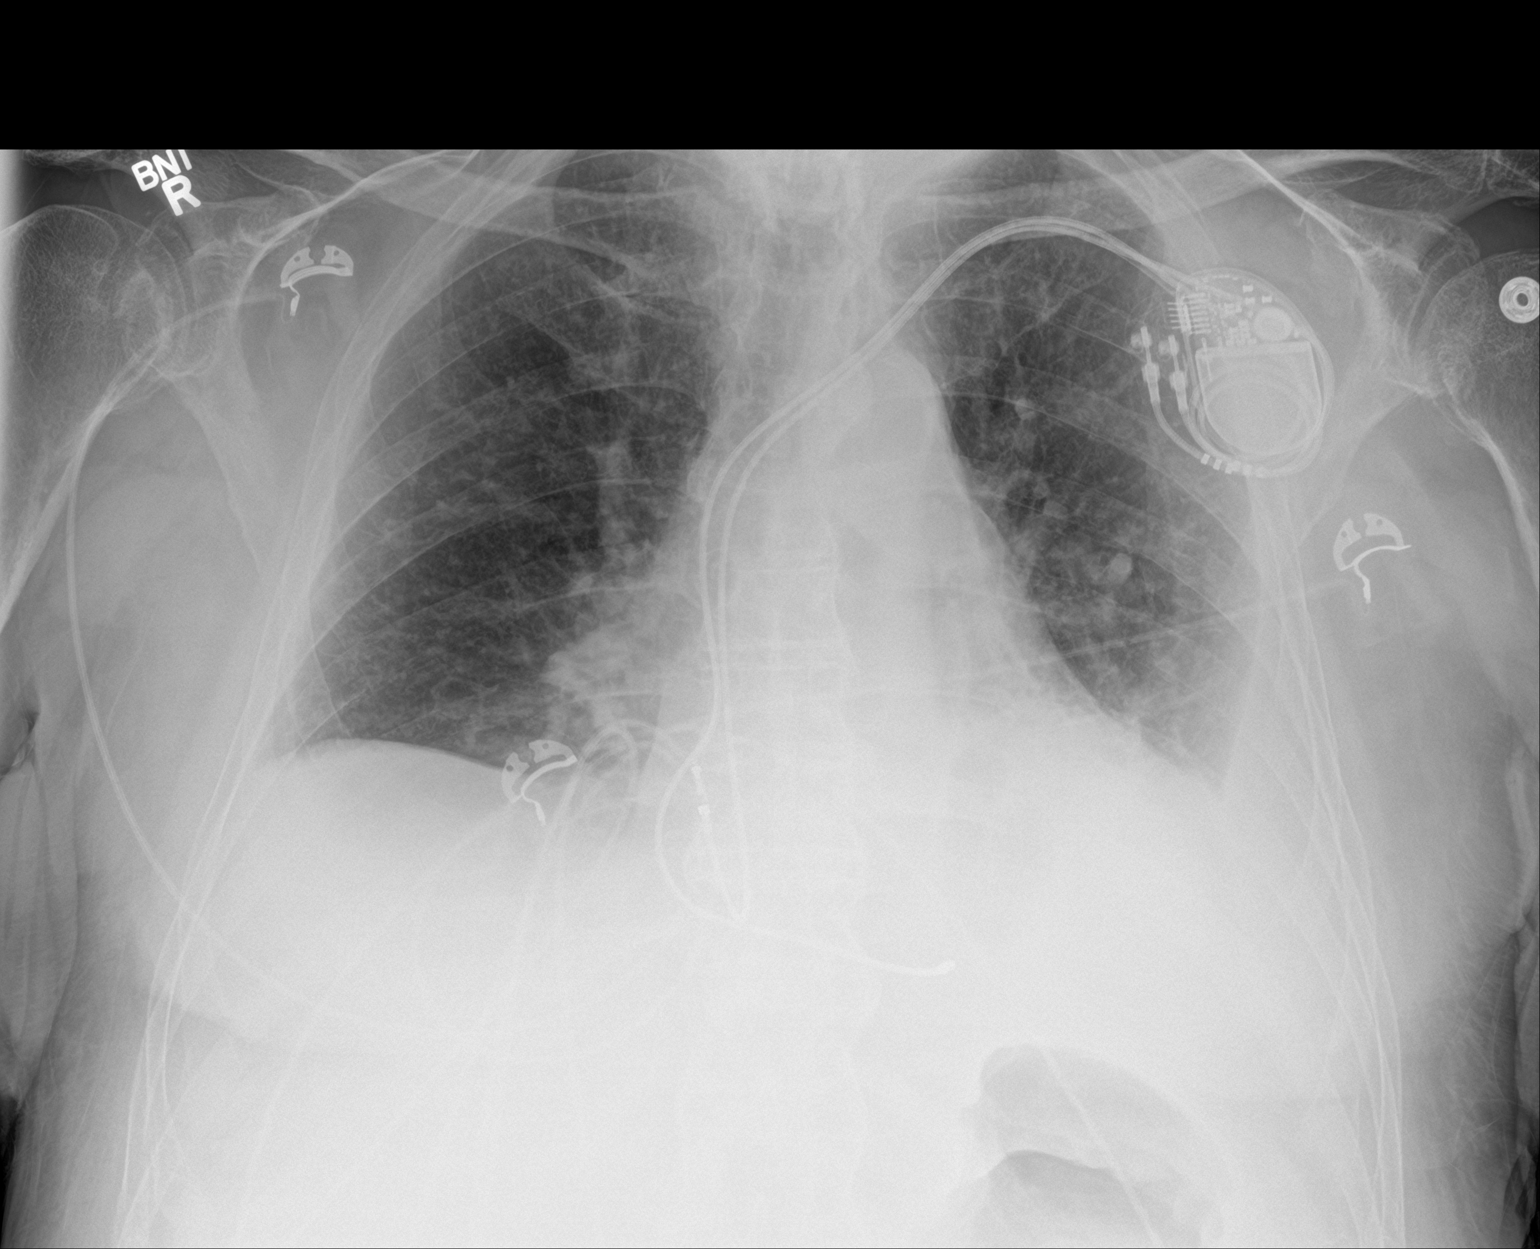

[2 of 2 positions shown; findings below may reference images not displayed]

FINDINGS: Left-sided pleural effusion and underlying opacity is stable. Stable
pacemaker. The heart, hila, mediastinum, lungs, and pleura are
otherwise unremarkable. No overt edema.
IMPRESSION: Stable left-sided pleural effusion with underlying opacity. No overt
pulmonary edema.
